# Patient Record
Sex: Female | Born: 1955
Health system: Southern US, Community
[De-identification: ages and names within clinical notes are randomized; demographics above are authoritative.]

## PROBLEM LIST (undated history)

## (undated) DIAGNOSIS — E785 Hyperlipidemia, unspecified: Secondary | ICD-10-CM

## (undated) DIAGNOSIS — K219 Gastro-esophageal reflux disease without esophagitis: Secondary | ICD-10-CM

## (undated) DIAGNOSIS — E039 Hypothyroidism, unspecified: Secondary | ICD-10-CM

## (undated) HISTORY — DX: Gastro-esophageal reflux disease without esophagitis: K21.9

## (undated) HISTORY — DX: Hyperlipidemia, unspecified: E78.5

## (undated) HISTORY — PX: DILATION AND CURETTAGE OF UTERUS: SHX78

## (undated) HISTORY — DX: Hypothyroidism, unspecified: E03.9

---

## 2001-03-31 DIAGNOSIS — D259 Leiomyoma of uterus, unspecified: Secondary | ICD-10-CM | POA: Insufficient documentation

## 2006-04-28 ENCOUNTER — Ambulatory Visit: Payer: Self-pay | Admitting: Internal Medicine

## 2006-04-29 ENCOUNTER — Ambulatory Visit: Payer: Self-pay | Admitting: *Deleted

## 2006-05-12 ENCOUNTER — Ambulatory Visit (HOSPITAL_COMMUNITY): Admission: RE | Admit: 2006-05-12 | Discharge: 2006-05-12 | Payer: Self-pay | Admitting: Internal Medicine

## 2006-05-29 ENCOUNTER — Encounter: Admission: RE | Admit: 2006-05-29 | Discharge: 2006-05-29 | Payer: Self-pay | Admitting: Family Medicine

## 2006-06-30 ENCOUNTER — Encounter (INDEPENDENT_AMBULATORY_CARE_PROVIDER_SITE_OTHER): Payer: Self-pay | Admitting: Internal Medicine

## 2006-06-30 ENCOUNTER — Other Ambulatory Visit: Admission: RE | Admit: 2006-06-30 | Discharge: 2006-06-30 | Payer: Self-pay | Admitting: Internal Medicine

## 2006-06-30 ENCOUNTER — Ambulatory Visit: Payer: Self-pay | Admitting: Internal Medicine

## 2006-06-30 LAB — CONVERTED CEMR LAB: Pap Smear: NORMAL

## 2006-07-28 ENCOUNTER — Ambulatory Visit: Payer: Self-pay | Admitting: Internal Medicine

## 2006-07-28 DIAGNOSIS — R498 Other voice and resonance disorders: Secondary | ICD-10-CM | POA: Insufficient documentation

## 2006-10-05 ENCOUNTER — Ambulatory Visit: Payer: Self-pay | Admitting: Internal Medicine

## 2006-10-05 DIAGNOSIS — M722 Plantar fascial fibromatosis: Secondary | ICD-10-CM | POA: Insufficient documentation

## 2006-12-02 ENCOUNTER — Telehealth (INDEPENDENT_AMBULATORY_CARE_PROVIDER_SITE_OTHER): Payer: Self-pay | Admitting: Internal Medicine

## 2006-12-04 ENCOUNTER — Encounter (INDEPENDENT_AMBULATORY_CARE_PROVIDER_SITE_OTHER): Payer: Self-pay | Admitting: Internal Medicine

## 2006-12-04 DIAGNOSIS — J309 Allergic rhinitis, unspecified: Secondary | ICD-10-CM | POA: Insufficient documentation

## 2006-12-05 DIAGNOSIS — R51 Headache: Secondary | ICD-10-CM | POA: Insufficient documentation

## 2006-12-05 DIAGNOSIS — R519 Headache, unspecified: Secondary | ICD-10-CM | POA: Insufficient documentation

## 2006-12-05 DIAGNOSIS — N946 Dysmenorrhea, unspecified: Secondary | ICD-10-CM | POA: Insufficient documentation

## 2006-12-08 ENCOUNTER — Ambulatory Visit: Payer: Self-pay | Admitting: Internal Medicine

## 2006-12-08 DIAGNOSIS — R1084 Generalized abdominal pain: Secondary | ICD-10-CM | POA: Insufficient documentation

## 2006-12-08 DIAGNOSIS — G2581 Restless legs syndrome: Secondary | ICD-10-CM | POA: Insufficient documentation

## 2006-12-08 DIAGNOSIS — R0789 Other chest pain: Secondary | ICD-10-CM | POA: Insufficient documentation

## 2006-12-08 LAB — CONVERTED CEMR LAB
ALT: 10 units/L (ref 0–35)
AST: 13 units/L (ref 0–37)
Chloride: 105 meq/L (ref 96–112)
Creatinine, Ser: 0.7 mg/dL (ref 0.40–1.20)
Sodium: 140 meq/L (ref 135–145)
Total Bilirubin: 0.3 mg/dL (ref 0.3–1.2)
Total Protein: 7 g/dL (ref 6.0–8.3)

## 2006-12-09 ENCOUNTER — Ambulatory Visit (HOSPITAL_COMMUNITY): Admission: RE | Admit: 2006-12-09 | Discharge: 2006-12-09 | Payer: Self-pay | Admitting: Internal Medicine

## 2006-12-09 ENCOUNTER — Encounter (INDEPENDENT_AMBULATORY_CARE_PROVIDER_SITE_OTHER): Payer: Self-pay | Admitting: *Deleted

## 2006-12-15 ENCOUNTER — Encounter (INDEPENDENT_AMBULATORY_CARE_PROVIDER_SITE_OTHER): Payer: Self-pay | Admitting: Internal Medicine

## 2006-12-15 ENCOUNTER — Encounter: Admission: RE | Admit: 2006-12-15 | Discharge: 2007-01-18 | Payer: Self-pay | Admitting: Internal Medicine

## 2006-12-16 ENCOUNTER — Encounter (INDEPENDENT_AMBULATORY_CARE_PROVIDER_SITE_OTHER): Payer: Self-pay | Admitting: *Deleted

## 2007-01-18 ENCOUNTER — Encounter (INDEPENDENT_AMBULATORY_CARE_PROVIDER_SITE_OTHER): Payer: Self-pay | Admitting: Internal Medicine

## 2007-01-19 ENCOUNTER — Telehealth (INDEPENDENT_AMBULATORY_CARE_PROVIDER_SITE_OTHER): Payer: Self-pay | Admitting: *Deleted

## 2007-02-05 ENCOUNTER — Ambulatory Visit: Payer: Self-pay | Admitting: Internal Medicine

## 2007-02-05 DIAGNOSIS — N3946 Mixed incontinence: Secondary | ICD-10-CM | POA: Insufficient documentation

## 2007-02-05 LAB — CONVERTED CEMR LAB
Nitrite: NEGATIVE
Protein, U semiquant: NEGATIVE
Specific Gravity, Urine: 1.01
WBC Urine, dipstick: NEGATIVE

## 2007-04-30 ENCOUNTER — Telehealth (INDEPENDENT_AMBULATORY_CARE_PROVIDER_SITE_OTHER): Payer: Self-pay | Admitting: *Deleted

## 2007-05-14 ENCOUNTER — Ambulatory Visit (HOSPITAL_COMMUNITY): Admission: RE | Admit: 2007-05-14 | Discharge: 2007-05-14 | Payer: Self-pay | Admitting: Internal Medicine

## 2007-05-20 ENCOUNTER — Ambulatory Visit: Payer: Self-pay | Admitting: Internal Medicine

## 2007-05-28 ENCOUNTER — Ambulatory Visit: Payer: Self-pay | Admitting: Internal Medicine

## 2007-05-28 ENCOUNTER — Encounter (INDEPENDENT_AMBULATORY_CARE_PROVIDER_SITE_OTHER): Payer: Self-pay | Admitting: Internal Medicine

## 2007-05-28 DIAGNOSIS — E782 Mixed hyperlipidemia: Secondary | ICD-10-CM | POA: Insufficient documentation

## 2007-05-28 DIAGNOSIS — K6289 Other specified diseases of anus and rectum: Secondary | ICD-10-CM | POA: Insufficient documentation

## 2007-06-05 ENCOUNTER — Encounter (INDEPENDENT_AMBULATORY_CARE_PROVIDER_SITE_OTHER): Payer: Self-pay | Admitting: Internal Medicine

## 2007-06-11 ENCOUNTER — Ambulatory Visit: Payer: Self-pay | Admitting: Internal Medicine

## 2007-06-19 ENCOUNTER — Encounter (INDEPENDENT_AMBULATORY_CARE_PROVIDER_SITE_OTHER): Payer: Self-pay | Admitting: Internal Medicine

## 2007-06-19 LAB — CONVERTED CEMR LAB
ALT: 11 units/L (ref 0–35)
Alkaline Phosphatase: 82 units/L (ref 39–117)
Basophils Absolute: 0 10*3/uL (ref 0.0–0.1)
Basophils Relative: 1 % (ref 0–1)
CO2: 21 meq/L (ref 19–32)
Cholesterol: 205 mg/dL — ABNORMAL HIGH (ref 0–200)
Creatinine, Ser: 0.57 mg/dL (ref 0.40–1.20)
Eosinophils Absolute: 0.1 10*3/uL (ref 0.0–0.7)
Eosinophils Relative: 1 % (ref 0–5)
HCT: 35.3 % — ABNORMAL LOW (ref 36.0–46.0)
Hemoglobin: 10.8 g/dL — ABNORMAL LOW (ref 12.0–15.0)
MCHC: 30.6 g/dL (ref 30.0–36.0)
MCV: 89.4 fL (ref 78.0–100.0)
Monocytes Absolute: 0.6 10*3/uL (ref 0.1–1.0)
RDW: 13.5 % (ref 11.5–15.5)
Total Bilirubin: 0.4 mg/dL (ref 0.3–1.2)
Total CHOL/HDL Ratio: 4.3
VLDL: 63 mg/dL — ABNORMAL HIGH (ref 0–40)

## 2007-10-07 ENCOUNTER — Encounter (INDEPENDENT_AMBULATORY_CARE_PROVIDER_SITE_OTHER): Payer: Self-pay | Admitting: Internal Medicine

## 2007-10-07 LAB — CONVERTED CEMR LAB: OCCULT 3: NEGATIVE

## 2009-08-30 ENCOUNTER — Encounter: Payer: Self-pay | Admitting: Internal Medicine

## 2009-10-16 ENCOUNTER — Encounter: Payer: Self-pay | Admitting: Internal Medicine

## 2009-10-16 ENCOUNTER — Encounter: Admission: RE | Admit: 2009-10-16 | Discharge: 2009-10-16 | Payer: Self-pay | Admitting: Specialist

## 2010-03-18 ENCOUNTER — Encounter: Payer: Self-pay | Admitting: Internal Medicine

## 2010-03-18 ENCOUNTER — Ambulatory Visit: Payer: Self-pay | Admitting: Internal Medicine

## 2010-03-18 DIAGNOSIS — R05 Cough: Secondary | ICD-10-CM

## 2010-03-18 DIAGNOSIS — R059 Cough, unspecified: Secondary | ICD-10-CM | POA: Insufficient documentation

## 2010-03-20 ENCOUNTER — Ambulatory Visit: Payer: Self-pay | Admitting: Internal Medicine

## 2010-03-26 LAB — CONVERTED CEMR LAB
Basophils Absolute: 0 10*3/uL (ref 0.0–0.1)
Eosinophils Absolute: 0.1 10*3/uL (ref 0.0–0.7)
HCT: 35.4 % — ABNORMAL LOW (ref 36.0–46.0)
Hemoglobin: 12 g/dL (ref 12.0–15.0)
Lymphocytes Relative: 26.7 % (ref 12.0–46.0)
Lymphs Abs: 1.7 10*3/uL (ref 0.7–4.0)
MCHC: 33.8 g/dL (ref 30.0–36.0)
Neutro Abs: 3.8 10*3/uL (ref 1.4–7.7)
RDW: 14.1 % (ref 11.5–14.6)

## 2010-04-21 ENCOUNTER — Encounter: Payer: Self-pay | Admitting: Occupational Therapy

## 2010-04-26 ENCOUNTER — Telehealth (INDEPENDENT_AMBULATORY_CARE_PROVIDER_SITE_OTHER): Payer: Self-pay | Admitting: *Deleted

## 2010-04-29 ENCOUNTER — Ambulatory Visit
Admission: RE | Admit: 2010-04-29 | Discharge: 2010-04-29 | Payer: Self-pay | Source: Home / Self Care | Attending: Internal Medicine | Admitting: Internal Medicine

## 2010-05-02 NOTE — Progress Notes (Signed)
  Phone Note Other Incoming   Request: Send information Summary of Call: Records received from Dr. Mayford Knife with [facility]. 11 pages forwarded to Dr. Sherene Sires for review.

## 2010-05-02 NOTE — Assessment & Plan Note (Signed)
Summary: Pulmonary consultation cough ? etiology   Visit Type:  Initial Consult Copy to:  Self Primary Sheri Rice/Referring Sheri Rice:  Dr. Lerry Liner  CC:  Cough.  History of Present Illness: 55 yo latino female limited English  never smoker denies any previous h/o resp problems but was dx and treated for allergic rhinitis in 2008 in [place] and referred to pulmonary clinic for cough 02/2010  March 18, 2010  1st pulmonary office eval for eval of cough comes and goes and 05/2009 gone for up a month then recurs not clear what she gets when it flares and what works vs what doesn't.  Present flare x one week,  worse when when lie down and while trying to sleep., assoc with nasal congestion. ? better with steroids in past,  ? better transiently from saba.  No excess or purulent secretions, apparently only sob with coughing, some HB and sore throat.    Pt denies any significant dysphagia, itching, sneezing,  nasal congestion or excess secretions,  fever, chills, sweats, unintended wt loss, pleuritic or exertional cp, hempoptysis, change in activity tolerance  orthopnea pnd or leg swelling .  Pt also denies any obvious fluctuation in symptoms with weather or environmental change or other alleviating or aggravating factors.       Current Medications (verified): 1)  Mucinex Fast-Max Dm Max 20-400 Mg/60ml Liqd (Dextromethorphan-Guaifenesin) .Marland Kitchen.. 1 Tsp Every 4 Hrs  Allergies (verified): No Known Drug Allergies  Past History:  Past Medical History: FIBROIDS, UTERUS (ICD-218.9) HOARSENESS (ICD-784.49) PLANTAR FASCIITIS, BILATERAL (ICD-728.71) DYSMENORRHEA (ICD-625.3) HEADACHE (ICD-784.0) ALLERGIC RHINITIS (ICD-477.9) * HX. OF [place] FOR LATENT TB Chronic / recurrent cough......................................Marland KitchenWert      - Allergy profile March 18, 2010       - Sinus CT March 19, 2010 >>  Family History: Reviewed history from 05/28/2007 and no changes required. Mother, 35, DM,  Hyperlipidemia Father, died age 66's --complications of injuries from MVA, PVD 5 Siblings:  Healthy Son, 38, allergies Daughter, 41, healthy Son, 18, healthy  Social History: Moved to [place] in 2005 [place] with husband. 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  Daughter is a Building control surveyor son in [place]. Never smoker   Review of Systems       The patient complains of shortness of breath with activity, shortness of breath at rest, productive cough, non-productive cough, chest pain, acid heartburn, indigestion, abdominal pain, sore throat, nasal congestion/difficulty breathing through nose, and change in color of mucus.  The patient denies coughing up blood, irregular heartbeats, loss of appetite, weight change, difficulty swallowing, tooth/dental problems, headaches, sneezing, itching, ear ache, anxiety, depression, hand/feet swelling, joint stiffness or pain, rash, and fever.    Vital Signs:  Patient profile:   55 year old female Height:      62 inches Weight:      155.50 pounds BMI:     28.54 O2 Sat:      97 % on Room air Temp:     97.8 degrees F oral Pulse rate:   71 / minute BP sitting:   118 / 80  (left arm)  Vitals Entered By: Vernie Murders (March 18, 2010 10:04 AM)  O2 Flow:  Room air  Physical Exam  Additional Exam:  amb latino female nad wt 161 > 155   March 18, 2010  HEENT: nl dentition, turbinates, and orophanx. Nl external ear canals without cough reflex NECK :  without  JVD/Nodes/TM/ nl carotid upstrokes bilaterally LUNGS: no acc muscle use, clear to A and P bilaterally without cough on insp or exp maneuvers CV:  RRR  no s3 or murmur or increase in P2, no edema   ABD:  soft and nontender with nl excursion in the supine position. No bruits or organomegaly, bowel sounds nl MS:  warm without deformities, calf tenderness, cyanosis or clubbing SKIN: warm and dry without  lesions   NEURO:  alert, approp, no deficits     CXR  Procedure date:  10/16/2009  Findings:      Possible mild bronchitis   Impression & Recommendations:  Problem # 1:  COUGH (ICD-786.2) The most common causes of chronic cough in immunocompetent adults include: upper airway cough syndrome (UACS), previously referred to as postnasal drip syndrome,  caused by variety of rhinosinus conditions; (2) asthma; (3) GERD; (4) chronic bronchitis from cigarette smoking or other inhaled environmental irritants; (5) nonasthmatic eosinophilic bronchitis; and (6) bronchiectasis. These conditions, singly or in combination, have accounted for up to 94% of the causes of chronic cough in prospective studies.  This is most c/w  Classic Upper airway cough syndrome, so named because it's frequently impossible to sort out how much is  CR/sinusitis with freq throat clearing (which can be related to primary GERD)   vs  causing  secondary extra esophageal GERD from wide swings in gastric pressure that occur with throat clearing, promoting self use of mint and menthol lozenges that reduce the lower esophageal sphincter tone and exacerbate the problem further These are the same pts who not infrequently have failed to tolerate ace inhibitors,  dry powder inhalers or biphosphonates or report having reflux symptoms that don't respond to standard doses of PPI  For now check sinus ct and allergy profile and rx for GERD then regroup.  Explained to pt and daughter: The standardized cough guidelines recently published in Chest are a 14 step process, not a single office visit,  and are intended  to address this problem logically,  with an alogrithm dependent on response to each progressive step  to determine a specific diagnosis with  minimal addtional testing needed. Therefore if compliance is an issue this empiric standardized approach simply won't work.   See instructions for specific recommendations   Medications Added to  Medication List This Visit: 1)  Pepcid 20 Mg Tabs (Famotidine) .... Take one by mouth at bedtime 2)  Protonix 40 Mg Tbec (Pantoprazole sodium) .... By mouth daily. take one half hour before eating. 3)  Mucinex Fast-max Dm Max 20-400 Mg/84ml Liqd (Dextromethorphan-guaifenesin) .Marland Kitchen.. 1 tsp every 4 hrs 4)  Prednisone 10 Mg Tabs (Prednisone) .... 4 each am x 2days, 2x2days, 1x2days and stop  Other Orders: T-Allergy Profile [facility] [place], [place], [place], [place], [place] [phone]) Misc. Referral (Misc. Ref) TLB-CBC Platelet - w/Differential ([id number]) Consultation Level V (57322)  Patient Instructions: 1)  Protonix  before bfast and pepcid 20 mg at bedtime  until comes return  2)  Prednisone 4 each am x 2days, 2x2days, 1x2days and stop  3)  GERD (REFLUX)  is a common cause of respiratory symptoms. It commonly presents without heartburn and can be treated with medication, but also with lifestyle changes including avoidance of late meals, excessive alcohol, smoking cessation, and avoid fatty foods, chocolate, peppermint, colas, red wine, and acidic juices such as orange juice. NO MINT OR MENTHOL PRODUCTS SO NO COUGH DROPS  4)  USE SUGARLESS CANDY INSTEAD (jolley ranchers)  5)  NO OIL BASED  VITAMINS  6)  See Patient Care Coordinator before leaving for sinus ct 7)  Please schedule a follow-up appointment in 4 weeks, sooner if needed  8)  Late add:  Unlike when you get a prescription for eyeglasses, it's not possible to always walk out of this or any medical office with a perfect prescription that is immediately effective  based on any test that we offer here.  On the contrary, it may take several weeks for the full impact of changes recommened today - hopefully you will respond well.  If not, then we'll adjust your medication on your next visit accordingly, knowing more then than we can possibly know now.     Prescriptions: PREDNISONE 10 MG  TABS (PREDNISONE) 4 each am x 2days, 2x2days, 1x2days and stop  #14 x 0   Entered  and Authorized by:   Nyoka Cowden MD   Signed by:   Nyoka Cowden MD on 03/18/2010   Method used:   Electronically to        [facility] [phone]* (retail)       [address]       [place], [place]  [address]       Ph: [phone]       Fax: [phone]   RxID:   [id number] PROTONIX 40 MG  TBEC (PANTOPRAZOLE SODIUM) By mouth daily. Take one half hour before eating.  #34 x 3   Entered and Authorized by:   Nyoka Cowden MD   Signed by:   Nyoka Cowden MD on 03/18/2010   Method used:   Electronically to        [facility] [phone]* (retail)       [address]       [place], [place]  [address]       Ph: [phone]       Fax: [phone]   RxID:   [id number]

## 2010-05-08 NOTE — Assessment & Plan Note (Signed)
Summary: Pulmonary/ cough better p rx of sinusitis    Copy to:  Self Primary Provider/Referring Provider:  Dr. Lerry Liner  CC:  cough is better.Marland Kitchen  History of Present Illness: 55 yo latino female limited English  never smoker denies any previous h/o resp problems but was dx and treated for allergic rhinitis in 2008 in [place] and referred to pulmonary clinic for cough 02/2010  March 18, 2010  1st pulmonary office eval for eval of cough comes and goes and 05/2009 gone for up a month then recurs not clear what she gets when it flares and what works vs what doesn't.  Present flare x one week,  worse when when lie down and while trying to sleep., assoc with nasal congestion. ? better with steroids in past,  ? better transiently from saba.  No excess or purulent secretions, apparently only sob with coughing, some HB and sore throat.  Protonix  before bfast and pepcid 20 mg at bedtime  until comes return  Prednisone 4 each am x 2days, 2x2days, 1x2days and stop  GERD (REFLUX)  diet   See Patient Care Coordinator before leaving for sinus ct > pos sphenoid sinusits rx with augmentin    April 29, 2010 cough is better, no sob. Pt denies any significant sore throat, dysphagia, itching, sneezing,  nasal congestion or excess secretions,  fever, chills, sweats, unintended wt loss, pleuritic or exertional cp, hempoptysis, change in activity tolerance  orthopnea pnd or leg swelling.  Pt also denies any obvious fluctuation in symptoms with weather or environmental change or other alleviating or aggravating factors.         Current Medications (verified): 1)  Protonix 40 Mg  Tbec (Pantoprazole Sodium) .... By Mouth Daily. Take One Half Hour Before Eating.  Allergies (verified): No Known Drug Allergies  Past History:  Past Medical History: FIBROIDS, UTERUS (ICD-218.9) HOARSENESS (ICD-784.49) PLANTAR FASCIITIS, BILATERAL (ICD-728.71) DYSMENORRHEA (ICD-625.3) HEADACHE (ICD-784.0) ALLERGIC RHINITIS  (ICD-477.9) * HX. OF [place] FOR LATENT TB Chronic / recurrent cough......................................Marland KitchenWert      - Allergy profile March 18, 2010 >  neg       - Sinus CT March 20, 2010 >>  sphenoid sinusitis  Vital Signs:  Patient profile:   55 year old female Height:      62 inches Weight:      154.13 pounds BMI:     28.29 O2 Sat:      95 % on Room air Temp:     97.6 degrees F oral Pulse rate:   71 / minute BP sitting:   122 / 78  (left arm) Cuff size:   regular  Vitals Entered By: Carver Fila (April 29, 2010 11:54 AM)  O2 Flow:  Room air CC: cough is better. Comments meds and allergies updated Phone number updated Carver Fila  April 29, 2010 11:54 AM    Physical Exam  Additional Exam:  amb latino female nad wt 161 > 155   March 18, 2010 > 154 April 29, 2010  HEENT: nl dentition, turbinates, and orophanx. Nl external ear canals without cough reflex NECK :  without JVD/Nodes/TM/ nl carotid upstrokes bilaterally LUNGS: no acc muscle use, clear to A and P bilaterally without cough on insp or exp maneuvers CV:  RRR  no s3 or murmur or increase in P2, no edema   ABD:  soft and nontender with nl excursion in the supine position. No bruits or organomegaly, bowel sounds nl MS:  warm without deformities, calf tenderness, cyanosis  or clubbing     CXR  Procedure date:  04/30/2010  Findings:       Comparison: Two-view chest x-ray 10/16/2009.   Findings: Cardiomediastinal silhouette unremarkable and unchanged. Lungs clear.  Bronchovascular markings normal.  No pleural effusions.  Degenerative changes involving the thoracic spine.  No significant interval change.   IMPRESSION: No acute cardiopulmonary disease.  Stable chest x-ray.  Impression & Recommendations:  Problem # 1:  COUGH (ICD-786.2) The most common causes of chronic cough in immunocompetent adults include: upper airway cough syndrome (UACS), previously referred to as postnasal drip syndrome,   caused by variety of rhinosinus conditions; (2) asthma; (3) GERD; (4) chronic bronchitis from cigarette smoking or other inhaled environmental irritants; (5) nonasthmatic eosinophilic bronchitis; and (6) bronchiectasis. These conditions, singly or in combination, have accounted for up to 94% of the causes of chronic cough in prospective studies.  This is most c/w  Classic Upper airway cough syndrome, so named because it's frequently impossible to sort out how much is  CR/sinusitis with freq throat clearing (which can be related to primary GERD)   vs  causing  secondary extra esophageal GERD from wide swings in gastric pressure that occur with throat clearing, promoting self use of mint and menthol lozenges that reduce the lower esophageal sphincter tone and exacerbate the problem further These are the same pts who not infrequently have failed to tolerate ace inhibitors,  dry powder inhalers or biphosphonates or report having reflux symptoms that don't respond to standard doses of PPI  For now mas  rx for GERD then repeat sinus ct if not happy with control  Other Orders: T-2 View CXR ([id number])  Patient Instructions: 1)  Protonix  before bfast and pepcid 20 mg at bedtime  until  cough completely gone 2)  GERD (REFLUX)  is a common cause of respiratory symptoms. It commonly presents without heartburn and can be treated with medication, but also with lifestyle changes including avoidance of late meals, excessive alcohol, smoking cessation, and avoid fatty foods, chocolate, peppermint, colas, red wine, and acidic juices such as orange juice. NO MINT OR MENTHOL PRODUCTS SO NO COUGH DROPS  3)  USE SUGARLESS CANDY INSTEAD (jolley ranchers)  4)  NO OIL BASED VITAMINS  5)  Call  Patient Care Coordinator Libby at [phone] in two weeks if not happy with cough control.   Orders Added: 1)  T-2 View CXR [[id number]]     Appended Document: Orders Update     Clinical Lists Changes  Orders: Added new  Service order of Est. Patient Level IV (45409) - Signed

## 2011-11-18 ENCOUNTER — Other Ambulatory Visit: Payer: Self-pay | Admitting: Specialist

## 2011-11-18 ENCOUNTER — Ambulatory Visit
Admission: RE | Admit: 2011-11-18 | Discharge: 2011-11-18 | Disposition: A | Discharge status: Home / Self Care | Payer: 59 | Source: Ambulatory Visit | Attending: Specialist | Admitting: Specialist

## 2011-11-18 DIAGNOSIS — M25552 Pain in left hip: Secondary | ICD-10-CM

## 2013-02-22 ENCOUNTER — Other Ambulatory Visit: Payer: Self-pay | Admitting: Emergency Medicine

## 2013-02-22 DIAGNOSIS — Z1231 Encounter for screening mammogram for malignant neoplasm of breast: Secondary | ICD-10-CM

## 2013-04-04 ENCOUNTER — Ambulatory Visit
Admission: RE | Admit: 2013-04-04 | Discharge: 2013-04-04 | Disposition: A | Discharge status: Home / Self Care | Payer: 59 | Source: Ambulatory Visit | Attending: Emergency Medicine | Admitting: Emergency Medicine

## 2013-04-04 DIAGNOSIS — Z1231 Encounter for screening mammogram for malignant neoplasm of breast: Secondary | ICD-10-CM

## 2015-03-16 ENCOUNTER — Ambulatory Visit (INDEPENDENT_AMBULATORY_CARE_PROVIDER_SITE_OTHER): Payer: Commercial Managed Care - HMO | Admitting: Family

## 2015-03-16 ENCOUNTER — Encounter: Payer: Self-pay | Admitting: Family

## 2015-03-16 VITALS — BP 138/90 | HR 81 | Temp 97.6°F | Resp 18 | Ht 65.0 in | Wt 153.1 lb

## 2015-03-16 DIAGNOSIS — M170 Bilateral primary osteoarthritis of knee: Secondary | ICD-10-CM | POA: Diagnosis not present

## 2015-03-16 DIAGNOSIS — M5030 Other cervical disc degeneration, unspecified cervical region: Secondary | ICD-10-CM | POA: Insufficient documentation

## 2015-03-16 MED ORDER — MELOXICAM 15 MG PO TABS: 15.0000 mg | ORAL_TABLET | Freq: Every day | ORAL | Status: DC

## 2015-03-16 MED ORDER — CYCLOBENZAPRINE HCL 10 MG PO TABS: 5.0000 mg | ORAL_TABLET | Freq: Three times a day (TID) | ORAL | Status: DC | PRN

## 2015-03-16 NOTE — Progress Notes (Signed)
Pre visit review using our clinic review tool, if applicable. No additional management support is needed unless otherwise documented below in the visit note. 

## 2015-03-16 NOTE — Patient Instructions (Signed)
Thank you for choosing [facility].  Summary/Instructions:  Your prescription(s) have been submitted to your pharmacy or been printed and provided for you. Please take as directed and contact our office if you believe you are having problem(s) with the medication(s) or have any questions.  Please stop by the lab on the basement level of the building for your blood work. Your results will be released to [place] (or called to you) after review, usually within 72 hours after test completion. If any changes need to be made, you will be notified at that same time.  If your symptoms worsen or fail to improve, please contact our office for further instruction, or in case of emergency go directly to the emergency room at the closest medical facility.   Distensin y esguince cervical con rehabilitacin (Cervical Strain and Sprain With Rehab) La distensin y el esguince cervical suelen deberse a lesiones provocadas por movimientos de Buyer, retail cervical. El latigazo cervical es un movimiento de flexin del cuello hacia atrs o adelante que es brusco y Garment/textile technologist, por ejemplo, durante un accidente automovilstico o mientras se practican deportes de contacto. Los msculos, los ligamentos, los tendones, los discos y los nervios del cuello son propensos a lesionarse cuando esto ocurre. [place] sufrir un esguince cervical aumenta con lo siguiente:  Artrosis de columna.  Situaciones que aumentan la probabilidad de sufrir accidentes o traumatismos de la cabeza o el cuello.  Deportes de Public affairs consultant (ftbol americano, rugby, hockey, automovilismo, gimnasia, buceo, karate de contacto o boxeo).  Poca fuerza y flexibilidad en el cuello.  Lesin previa en el cuello.  Mala tcnica de placaje.  Equipo de calce inadecuado o que no est bien acolchado. SNTOMAS   Dolor o rigidez en la parte delantera o posterior del cuello, o en ambas.  Los sntomas pueden aparecer de inmediato o en  el trmino de 24horas despus de la lesin.  Mareos, dolor de [place], nuseas y vmitos.  Espasmo muscular con dolor y rigidez en el cuello.  Dolor a la palpacin e Estate agent de la lesin. PREVENCIN  Aprenda y use las tcnicas adecuadas (no plaque ni embista con la cabeza, ni d topetazos; use las tcnicas correctas para caer a fin de evitar caerse de cabeza).  Haga los ejercicios de precalentamiento y elongacin correctos antes de la [place].  Mantngase en buen estado fsico:  Kerry Hough, flexibilidad y resistencia.  Buen estado cardiovascular.  Use equipo de proteccin que calce correctamente y est bien acolchado, por ejemplo, collarines blandos acolchados, cuando practique deportes de contacto. PRONSTICO  La recuperacin de las lesiones por distensin y esguince cervical depende de la magnitud de la lesin. Generalmente, la lesin se cura en el trmino de 1semana a 30meses con el tratamiento adecuado.  COMPLICACIONES RELACIONADAS   Pueden presentarse adormecimiento y debilidad temporarios si las races nerviosas estn daadas, que pueden continuar hasta que el nervio est completamente curado.  Dolor crnico debido a la recurrencia frecuente de los sntomas.  Recuperacin prolongada, especialmente si se reanuda la [employer] pronto (antes de la recuperacin total). TRATAMIENTO  Inicialmente, el tratamiento incluye el uso de hielo y medicamentos para ayudar a Best boy y la inflamacin. Tambin es Publishing rights manager ejercicios de fortalecimiento y Landscape architect, y Radio broadcast assistant las actividades que intensifican los sntomas para que la lesin no empeore. Estos ejercicios pueden realizarse en la casa o con un terapeuta. A los pacientes que tienen sntomas intensos, tal vez se les recomiende el uso de  un collarn blando acolchado alrededor del cuello.  Mejorar la postura puede ayudar a [employer] sntomas. La mejora de la postura incluye hundir el abdomen y el  mentn mientras est de pie o sentado. Si se sienta, hgalo en una silla firme con los glteos apoyados contra el respaldo. Mientras duerme, intente reemplazar la almohada por una toalla pequea enrollada de 2pulgadas (5centmetros) de dimetro, o use una almohada cervical o un collarn cervical blando. Las S[employer]otive posiciones al dormir Radiographer, therapeutic.  Para los pacientes que tienen dao de las races nerviosas que les causa adormecimiento o debilidad, puede ser recomendable un aparato de traccin cervical. En contadas ocasiones, se debe realizar una ciruga para tratar estas lesiones. Sin embargo, es posible que la distensin y los esguinces cervicales que estn presentes al nacer (congnitos) requieran [place]. MEDICAMENTOS   Si se necesitan analgsicos, a menudo se recomiendan los antiinflamatorios no esteroides, como la aspirina y el ibuprofeno, u otros analgsicos suaves, como el paracetamol.  No tome analgsicos durante 7das antes de la [place].  Se pueden administrar analgsicos recetados si el mdico lo considera necesario. Utilcelos como se le indique y solo cuando lo necesite. CALOR Y FRO:   El tratamiento confro (aplicacin de hielo) Futures trader dolor y reduce la inflamacin. Este se debe aplicar durante 10 a [id number] cada 2 o 3horas para la inflamacin y Conservation officer, historic buildings, e inmediatamente despus de Optometrist cualquier actividad que intensifique los sntomas. Use bolsas de hielo o un masaje con hielo.  Se puede usar Charity fundraiser antes de Optometrist las actividades de elongacin y fortalecimiento indicadas por el mdico, el fisioterapeuta o Industrial/product designer. Pngase una compresa caliente o dese un bao tibio de inmersin. SOLICITE ATENCIN MDICA SI:   Los sntomas empeoran o no mejoran en 2semanas, a pesar de Chiropodist.  Presenta sntomas nuevos sin motivo aparente (los medicamentos utilizados durante el tratamiento pueden producir [place]). EJERCICIOS EJERCICIOS DE AMPLITUD DE MOVIMIENTOS Y DE ELONGACIN: distensin y esguince cervical Estos ejercicios pueden ser de ayuda al comenzar la rehabilitacin de la lesin. Para que los sntomas se resuelvan satisfactoriamente, debe mejorar la postura. Estos ejercicios estn diseados para ayudar a Museum/gallery exhibitions officer de la cabeza hacia adelante y protraccin de los hombros, la cual contribuye a Personnel officer. Los sntomas pueden resolverse con o sin mayor intervencin del mdico, el fisioterapeuta o Industrial/product designer. Mientras realiza estos ejercicios, recuerde lo siguiente:   Al restablecer la flexibilidad de los tejidos, las articulaciones recuperan el movimiento normal, lo que permite movimientos y actividades ms dinmicos y con Producer, television/film/video.  La elongacin eficaz se debe mantener durante por lo menos 20segundos, aunque tal vez deba comenzar con sesiones de [employer] para su comodidad.  La elongacin nunca debe ser dolorosa. Solo debe sentir un estiramiento o aflojamiento suave en tejido en elongacin. ELONGACIN: extensores axiales  Acustese en el piso boca arriba. Puede flexionar las rodillas para estar cmodo. Coloque un toalla de mano o un repasador enrollado, de unas 2pulgadas (5centmetros) de dimetro, debajo de la zona de la cabeza que est apoyada sobre el piso.  Suavemente hunda el [place], como si intentara formar una papada, [place] sentir una leve elongacin en la base de la cabeza.  Mantenga la posicin durante __________segundos. Repita __________veces. Haga este ejercicio __________veces por da.  ELONGACIN: extensin axial  Prese o sintese sobre una superficie firme. Adopte una postura correcta: el pecho erguido, los hombros [place] atrs, los msculos abdominales apenas tensos, las rodillas sin  trabar (si est de pie) y los pies separados al ancho las caderas.  Con un movimiento lento, lleve el [employer] Health, de modo que la cabeza se deslice hacia  atrs y el mentn baje levemente. Siga mirando hacia adelante.  Debe sentir una elongacin [employer] parte posterior de la cabeza. Tenga presente que la elongacin no tiene que ser brusca ya que esto puede causar dolores de cabeza ms tarde.  Mantenga la posicin durante __________segundos. Repita __________veces. Haga este ejercicio __________veces por da. ELONGACIN: flexin cervical lateral   Prese o sintese sobre una superficie firme. Adopte una postura correcta: el pecho erguido, los hombros [employer], los msculos abdominales apenas tensos, las rodillas sin trabar (si est de pie) y los pies separados al ancho las caderas.  Sin mover la nariz ni los hombros, lentamente deje caer la oreja derecha / izquierdo hacia el hombro hasta sentir la elongacin suave de los msculos del lado contrario del cuello.  Mantenga la posicin durante __________segundos. Repita __________veces. Haga este ejercicio __________veces por da. ELONGACIN: rotadores cervicales   Prese o sintese sobre una superficie firme. Adopte una postura correcta: el pecho erguido, los hombros [employer], los msculos abdominales apenas tensos, las rodillas sin trabar (si est de pie) y los pies separados al ancho las caderas.  Con los ojos nivelados con el piso, gire lentamente la cabeza hasta sentir una elongacin [place] a lo largo de la espalda y el lado opuesto del cuello.  Mantenga la posicin durante __________segundos. Repita __________veces. Haga este ejercicio __________veces por da. AMPLITUD DE MOVIMIENTOS: crculos con el cuello   Prese o sintese sobre una superficie firme. Adopte una postura correcta: el pecho erguido, los hombros [employer], los msculos abdominales apenas tensos, las rodillas sin trabar (si est de pie) y los pies separados al ancho las caderas.  Suavemente baje la cabeza y haga movimientos circulares desde la parte posterior de un hombro hasta la parte posterior del [place].  El movimiento nunca debe ser forzado ni doloroso.  Repita el movimiento 10 o 20veces, o hasta que sienta que los msculos del cuello se Engineer, agricultural y se aflojan. Repita __________veces. Haga el ejercicio __________veces por da. EJERCICIOS DE FORTALECIMIENTO: distensin y esguince cervical Estos ejercicios pueden ser de ayuda al comenzar la rehabilitacin de la lesin. Estos pueden resolver los sntomas con o sin mayor intervencin del mdico, el fisioterapeuta o Industrial/product designer. Mientras realiza estos ejercicios, recuerde lo siguiente:   Los msculos pueden adquirir la resistencia y la fuerza necesarias para las actividades cotidianas a travs de ejercicios controlados.  Realice estos ejercicios como se lo hayan indicado el mdico, el fisioterapeuta o Industrial/product designer. Aumente la resistencia y las repeticiones solo como se lo hayan indicado.  Puede tener dolor o fatiga muscular; sin embargo, Conservation officer, historic buildings o las molestias que intenta eliminar nunca deben intensificarse durante la realizacin de estos ejercicios. Si el dolor se intensifica, detngase y asegrese de estar siguiendo las indicaciones de Fish farm manager. Si an siente dolor despus de los ajustes, deje de hacer el ejercicio hasta tanto pueda analizar el problema con el mdico. FUERZA: flexores cervicales, isomtrico   Prese de frente a una pared a una distancia aproximada de 6pulgadas (15centmetros). Coloque una almohada pequea, una pelota de unas 6 a 8pulgadas (15 a 20centmetros) de dimetro o una toalla doblada entre la frente y la pared.  Hunda levemente el mentn y, con [place], empuje el objeto blando con la frente. La intensidad del empuje debe ser leve a  moderada, y la tensin debe aumentarse de manera gradual. Mantenga relajadas la mandbula y la frente.  Mantenga la posicin durante 10 a 20segundos. Respire tranquilo.  [place] lentamente la tensin. Relaje los msculos del cuello por completo antes de comenzar la siguiente  repeticin. Repita __________veces. Haga este ejercicio __________veces por da. FUERZA: flexores cervicales laterales, isomtrico   Prese a una distancia aproximada de 6pulgadas (15centmetros) de una pared. Coloque una almohada pequea, una pelota de unas 6 a 8pulgadas (15 a 20centmetros) de Occupational hygienist o una toalla doblada entre el costado de la cabeza y la pared.  Hunda levemente el mentn y, con [place], empuje el objeto blando con la [place]. La intensidad del empuje debe ser leve a moderada, y la tensin debe aumentarse de manera gradual. Mantenga relajadas la mandbula y la frente.  Mantenga la posicin durante 10 a 20segundos. Respire tranquilo.  [place] lentamente la tensin. Relaje los msculos del cuello por completo antes de comenzar la siguiente repeticin. Repita __________veces. Haga este ejercicio __________veces por da. FUERZA: extensores cervicales, isomtrico  Prese a una distancia aproximada de 6pulgadas (15centmetros) de una pared. Coloque una almohada pequea, una pelota de unas 6 a 8pulgadas (15 a 20centmetros) de dimetro o una toalla doblada entre la zona posterior de la cabeza y la pared.  Hunda levemente el mentn y, con [place], empuje el objeto blando con la parte posterior de la cabeza. La intensidad del empuje debe ser leve a moderada, y la tensin debe aumentarse de manera gradual. Mantenga relajadas la mandbula y la frente.  Mantenga la posicin durante 10 a 20segundos. Respire tranquilo.  [place] lentamente la tensin. Relaje los msculos del cuello por completo antes de comenzar la siguiente repeticin. Repita __________veces. Haga este ejercicio __________veces por da. CONSIDERACIONES ACERCA DE LA POSTURA Y LA MECNICA DEL CUERPO: distensin y esguince cervical Mantener una postura correcta mientras est sentado, de pie o realizando sus actividades reducir la tensin en los diferentes tejidos del cuerpo, lo que permitir la  recuperacin de los tejidos lesionados y la disminucin de las experiencias que Financial risk analyst. A continuacin se incluyen pautas generales para mejorar la postura. El mdico o el fisioterapeuta le darn indicaciones especficas para sus necesidades. Mientras lea estas pautas, recuerde lo siguiente:  Los ejercicios que el mdico le indique lo ayudarn a Systems analyst flexibilidad y la fuerza para Theatre manager las posturas correctas.  La postura correcta ofrece a las articulaciones el entorno ptimo para su funcionamiento. Todas las articulaciones sufren un desgaste menor cuando la columna est en la postura correcta y brinda un sostn adecuado. Esto significa un cuerpo ms sano y con [employer].  En todas las actividades, la postura debe ser la correcta, especialmente cuando est sentado o de pie. La postura correcta es igual de importante cuando realiza actividades repetitivas con bajo nivel de tensin (tipear) y cuando lleva a cabo una nica actividad con cargas pesadas (levantar objetos). DE PIE DURANTE MUCHO TIEMPO E [place]  Cuando realice una tarea que le exija inclinarse hacia adelante mientras est de pie en un lugar durante mucho tiempo, apoye un pie sobre un objeto inmvil que tenga una altura de 2 a 4pulgadas (5 a 10centmetros), para Therapist, occupational. Cuando ambos pies estn apoyados en el piso, la parte baja de la espalda tiende a perder la curvatura leve que tiene [place]. Si esta curva se aplana (o se vuelve muy pronunciada), aumentar mucho la tensin sobre la espalda y las dems articulaciones, se fatigarn con  mayor rapidez y tal Copywriter, advertising.  POSICIONES DE DESCANSO Tenga en cuentas las posiciones que ms dolor le causan cuando elija una de descanso. Si las [employer] exigen flexionarse (sentarse, agacharse, encorvarse, [employer] en cuclillas) le Financial risk analyst, opte por una posicin que le permita descansar en una postura menos flexionada.  No se curve en posicin fetal de costado. Si el dolor se intensifica con las [employer] exigen extenderse (estar de pie durante mucho tiempo, trabajar con las manos por encima de la cabeza), no descanse en una posicin extendida, por ejemplo, dormir boca abajo. La mayora de las personas estarn ms cmodas cuando descansen con la columna vertebral en una posicin ms neutral, ni muy curvada ni muy arqueada. Con frecuencia, se sentir ms aliviado si se acuesta de costado en una cama que no se hunda con una [employer]o, o boca arriba con una almohada debajo de las rodillas. Recuerde Sales promotion account executive en una sola posicin durante [employer], sin importar si la postura es [place], puede causar rigidez. CAMINAR [place] erguido. Las [place], los hombros y las caderas deben estar alineados. TRABAJO DE OFICINA Si trabaja en un escritorio, cree un entorno que le permita mantener una buena postura erguida. Sin soporte adicional, los msculos se fatigan y causan una tensin excesiva en las articulaciones y otros tejidos. SILLA:  La silla debe poder deslizarse por debajo del escritorio cuando apoye la espalda en el respaldo. Esto le permite trabajar ms cerca.  La altura de la silla debe permitirle que los ojos estn nivelados con la parte superior del monitor y las manos estn apenas ms abajo que los codos.  Posicin del cuerpo:  Debe tener los pies apoyados en el piso. Si no es posible, use un posapies.  Mantenga las orejas por encima de los hombros. Esto reducir la tensin en el cuello y la cintura.   Esta informacin no tiene Marine scientist el consejo del mdico. Asegrese de hacerle al mdico cualquier pregunta que tenga.   Document Released: 01/01/2006 Document Revised: 04/07/2014 Elsevier Interactive Patient Education 2016 [place] con rehabilitacin (Low Back Sprain With Rehab) Un esguince es una lesin en la que el ligamento se  desgarra. Los ligamentos de la cintura son susceptibles de sufrir esguinces. Sin embargo, estos ligamentos son Orlene Erm fuertes y se requiere de una gran fuerza para lesionarlos. Son importantes para estabilizar la mdula [place] esguinces se clasifican en tres categoras. Los esguinces de grado 1 ocasionan dolor, pero el tendn no est alargado. En los esguinces de grado 2 hay un ligamento alargado, debido a un estiramiento o desgarro parcial. En el esguince de [place] 2 an se mantiene la funcin, aunque sta puede estar alterada. Un esguince en grado 3 es la ruptura completa del msculo o el tendn, y suele quedar incapacitada la funcin. SNTOMAS  Dolor intenso en la cintura.  Sensacin de estallido o ruptura en el momento de la lesin.  Sensibilidad y a veces hinchazn en la zona de la lesin.  Algunas veces, hematoma (contusin) en el lugar de la lesin dentro de las 48 horas.  Espasmos musculares en la espalda. CAUSAS El esguince se produce cuando se aplica una fuerza en el ligamento que es mayor de lo que puede soportar. Las causas ms frecuentes de la lesin son:  Sherrye Payor actividad estresante en una posicin incmoda.  Actividades estresantes repetidas que implican movimiento de la cintura.  [place] directo en la cintura (traumatismo).  LOS RIESGOS AUMENTAN CON:  Deportes de contacto (ftbol, lucha).  Colisiones (principalmente accidentes de esqu).  Deportes que requieren arrojar o Retail banker elemento (levantamiento de pesas, bisbol).  Deportes que implican girar la columna (gimnasia, clavados, tenis, golf)  Poca fuerza y flexibilidad.  Proteccin inadecuada.  Cirugas previas en la espalda (especialmente fusin). PREVENCIN  Use el equipo protector adecuado y [employer].  Precalentamiento adecuado y elongacin antes de la [place].  Descanso y recuperacin entre actividades.  Mantener la forma fsica:  Kerry Hough, flexibilidad y resistencia  muscular.  Capacidad cardiovascular.  Mantenga un peso corporal adecuado. PRONSTICO Si se trata adecuadamente, estos esguinces pueden curarse con tratamiento no quirrgico. El tiempo de curacin depende de la gravedad de la lesin.  posibles complicaciones:  La recurrencia frecuente de los sntomas puede dar como resultado un problema crnico.  Inflamacin crnica y dolor en la cintura.  Retraso en la curacin o resolucin de los sntomas, en particular si se retoma la actividad rpidamente.  Discapacidad prolongada.  Articulacin inestable o artrtica en la cintura. TRATAMIENTO El tratamiento inicial incluye el uso de medicamentos y la aplicacin de hielo para reducir Conservation officer, historic buildings y la inflamacin. Los ejercicios de elongacin y fortalecimiento pueden ayudar a reducir Conservation officer, historic buildings con la [place]. Los ejercicios pueden Press photographer o con un terapeuta. Los Apple Computer graves pueden requerir la derivacin a un fisioterapeuta para Film/video editor evaluacin y Medical laboratory scientific officer un tratamiento, como ultrasonido. El profesional podr indicarle el uso de un dispositivo ortopdico para ayudar a Dietitian y la inflamacin. A menudo, demasiado reposo en cama podr resultar en ms daos que beneficios. Podrn prescribirle inyecciones de corticoides. Sin embargo, esto deber reservarse para los casos ms graves. Es Theatre manager uso de la espalda cuando se levantan objetos. Por la noche, se aconseja que usted [place], sobre un colchn firme y coloque una almohada debajo de las rodillas. Si no se obtiene xito con Music therapist, ser necesario someterse a [place].  MEDICAMENTOS   Si es necesaria la administracin de medicamentos para Conservation officer, historic buildings, se recomiendan los antiinflamatorios no esteroides, como aspirina e ibuprofeno y otros calmantes menores, como acetaminofeno.  No tome medicamentos para el dolor dentro de los 7 das previos a la [place].  El profesional podr  prescribirle calmantes si lo considera necesario. Utilcelos como se le indique y slo cuando lo necesite.  Podr beneficiarse con [employer].  En algunos casos se indica una inyeccin de corticosteroides. Estas inyecciones deben reservarse para los casos graves, porque slo se pueden administrar una determinada cantidad de veces. CALOR Y FRO   El fro (con hielo) debe aplicarse durante 10 a 15 minutos cada 2  3 horas para reducir la inflamacin y Conservation officer, historic buildings e inmediatamente despus de cualquier actividad que agrava los sntomas. Utilice bolsas o un masaje de hielo.  El calor puede usarse antes de Neurosurgeon y [place] fortalecimiento indicadas por el profesional, le fisioterapeuta o Industrial/product designer. Utilice una bolsa trmica o un pao hmedo. SOLICITE ATENCIN MDICA SI:   Los sntomas empeoran o no mejoran en 2 a 4 semanas, an realizando Lexicographer.  Presenta adormecimiento o debilidad en alguna de las piernas.  Prdida del control del intestino o de la vejiga.  Luego de la ciruga observa lo siguiente: fiebre, dolor intenso, hinchazn, enrojecimiento, drena lquido o sangra en la regin de la herida.  Desarrolla nuevos e inexplicables sntomas. (Los medicamentos utilizados en el tratamiento  le ocasionan efectos secundarios). Vandiver personas con dolor de espalda baja encuentran que sus sntomas empeoran al doblarse hacia adelante (flexin) o al arquear la regin inferior de la espalda (extensin). Los ejercicios que le ayudarn a Investment banker, operational sus sntomas se Furniture conservator/restorer.  El mdico, fisioterapeuta o Radiation protection practitioner ayudarn a Teacher, adult education qu ejercicios sern de ayuda para resolver su dolor de espalda. No realice ningn ejercicio sin consultarlo antes con el profesional. Discontine los ejercicios que empeoran sus sntomas, hasta que hable con el  mdico. Si siente dolor, entumecimiento u hormigueo que [employer] glteos, piernas o pies, el objetivo de esta terapia es que estos sntomas se acerquen a la espalda y Occupational hygienist. A veces, estos sntomas en las piernas mejoran, pero el dolor de espalda empeora. Este suele ser un indicio de progreso en su rehabilitacin. Asegrese de que estar atento a cualquier cambio en sus sntomas y las actividades que ha [employer] 24 horas antes del cambio. Compartir esta informacin con su mdico le permitir un mejor tratamiento para tratar su enfermedad. Estos ejercicios le ayudarn en la recuperacin de la lesin. Los sntomas podrn aliviarse con o sin asistencia adicional de su mdico, fisioterapeuta o Administrator, sports. Al completar estos ejercicios, recuerde:   Restaurar la flexibilidad del tejido ayuda a que las articulaciones recuperen el movimiento normal. Esto permite que el movimiento y la actividad sea ms saludables y menos dolorosos.  Para que sea efectiva, cada elongacin debe realizarse durante al menos 30 segundos.  La elongacin nunca debe ser dolorosa. Deber sentir slo un alargamiento o distensin suave del tejido que estira. EJERCICIOS DE AMPLITUD DE MOVIMIENTOS Y ELONGACIN: ELONGACION Flexin - una rodilla al pecho  Recustese en una cama dura o sobre el piso, con ambas piernas extendidas al frente.  Manteniendo una pierna en contacto con el piso, lleve la rodilla opuesta al pecho. Mantenga la pierna en esa posicin, sostenindola por la zona posterior del muslo o por la rodilla.  Presione hasta sentir un suave estiramiento en la cintura. Mantenga esta posicin durante __________ segundos.  Libere la pierna lentamente y repita el ejercicio con el lado opuesto. Reptalo __________ veces. Realice este ejercicio __________ veces por da.  ELONGACIN - Flexin, dos rodillas al pecho   Recustese en una cama dura o sobre el piso, con ambas piernas extendidas al  frente.  Manteniendo una pierna en contacto con el piso, lleve la rodilla opuesta al pecho.  Tense los msculos del estmago para apoyar la espalda y levante la otra rodilla [place]. Mantenga las piernas en su lugar y tmese por detrs [place].  Con ambas rodillas en el pecho, tire hasta que sienta un estiramiento en la parte trasera de la espalda. Mantenga esta posicin durante __________ segundos.  Tense los msculos del estmago y baje las piernas de a una por vez. Reptalo __________ veces. Realice este ejercicio __________ veces por da.  ELONGACIN - Rotacin de la zona baja del tronco  Recustese sobre una cama firme o sobre el suelo. [place], doble las rodillas de modo que ambas apunten hacia el techo y los pies queden bien apoyados en el piso.  Extienda los brazos a Teaching laboratory technician. Esto estabilizar la zona superior del cuerpo, manteniendo los hombros en contacto con el piso.  Con cuidado y lentamente deje caer ambas rodillas juntas hacia un  lado, hasta que sienta un suave estiramiento en la espalda baja. Mantenga esta posicin durante __________ segundos.  Tensione los [employer] del estmago para Nature conservation officer la cintura mientras lleva las rodillas nuevamente a la posicin [place]. Reptalo __________ veces. Realice este ejercicio __________ veces por da. EJERCICIOS DE AMPLITUD DE MOVIMIENTOS Y FLEXIBILIDAD: ELONGACIN - Extensin posicin prona sobre los codos  Acustese sobre el estmago sobre el piso, una cama ser muy blanda. Coloque las palmas a una distancia igual al ancho de los hombres y a la altura de la cabeza.  Coloque los codos bajo los hombros. Si siente dolor, colquese almohadas debajo del pecho.  Deje que su cuerpo se relaje, de modo que las caderas queden ms abajo y tengan ms contacto con el piso.  Mantenga esta posicin durante __________ segundos.  Vuelva lentamente a la  posicin plana sobre el piso. Reptalo __________ veces. Realice este ejercicio __________ veces por da.  [place] de brazos en posicin prona  Acustese sobre el [employer] piso, una cama ser [place]. Coloque las palmas a una distancia igual al ancho de los hombres y a la altura de la cabeza.  Mantenga la espalda tan relajada como pueda, enderece lentamente los codos mientras mantiene las caderas contra el suelo. Puede modificar la posicin de las manos para estar ms cmodo. A medida que gana movimiento, sus manos quedarn ms por debajo de los hombros.  Mantenga cada posicin durante __________ segundos.  Vuelva lentamente a la posicin plana sobre el piso. Reptalo __________ veces. Realice este ejercicio __________ veces por da.  AMPLITUD DE MOVIMIENTOS - Cuadrpedo [place] vertebral neutral  [place] y las rodillas en una superficie firme. Las manos deben quedar a la altura de los hombros y las rodillas [place]. Puede colocar algo debajo las rodillas para estar ms cmodo.  Haga caer la cabeza y apunte el cccix hacia el suelo debajo de usted. De este modo se redondear la cintura, en [place] similar a un gato enojado. Mantenga esta posicin durante __________ segundos.  Lentamente levante la cabeza y afloje el cccix para que se hunda el cuerpo en un gran arco, como un caballo.  Mantenga esta posicin durante __________ segundos.  Reptalo hasta sentir calor en la cintura.  Ahora encuentre su "punto ideal". Ser la posicin ms cmoda [employer]. En esta posicin es cuando su columna est neutral. Una vez que encuentre la posicin, tensione los msculos del estmago para sostener la zona inferior de la espalda.  Mantenga esta posicin durante __________ segundos. Reptalo __________ veces. Realice este ejercicio __________ veces por da.  EJERCICIOS DE FORTALECIMIENTO - Esguince de  la cintura Estos ejercicios le ayudarn en la recuperacin de la lesin. Estos ejercicios deben hacerse cerca de su "punto dulce". Este es el arco neutro, de la parte baja de la espalda, en algn lugar entre la posicin completamente redondeada y arqueada plenamente, que es la posicin menos dolorosa. Cuando se realiza en [employer] de seguridad del movimiento, estos ejercicios se pueden Risk manager para las personas que tienen una lesin basada en flexin o extensin. Con estos ejercicios, los sntomas podrn desaparecer con o sin mayor intervencin del profesional, el fisioterapeuta o Industrial/product designer. Al completar estos ejercicios, recuerde:   Los msculos pueden ganar la resistencia y la fuerza necesarias para las actividades diarias a travs de ejercicios controlados.  Realice los ejercicios como se lo indic  el mdico, el fisioterapeuta o Industrial/product designer. Aumente la resistencia y las repeticiones segn se le haya indicado.  Podr experimentar dolor o cansancio muscular, pero el dolor o molestia que trata de eliminar a travs de los ejercicios nunca debe empeorar. Si el dolor empeora, detngase y asegrese de que est siguiendo las directivas correctamente. Si an siente dolor luego de Optometrist lo ajustes necesarios, deber discontinuar el ejercicio hasta que pueda conversar con el profesional sobre el problema. FORTALECIMIENTO - Abdominales profundos - Inclinacin plvica  Recustese sobre una cama firme o sobre el suelo. [place], doble las rodillas de modo que ambas apunten hacia el techo y los pies queden bien apoyados en el piso.  Tensione la zona baja de los msculos abdominales para presionar la Materials engineer. Este movimiento har rotar su pelvis de modo que el cccix quede hacia arriba y no apuntando a los pies o hacia el piso. Con una tensin suave y respiracin pareja, mantenga esta posicin durante __________ segundos. Reptalo __________ veces. Realice este  ejercicio __________ veces por da.  FORTALECIMIENTO - Abdominales encogimiento abdominal.  Recustese sobre una cama firme o sobre el suelo. [place], doble las rodillas de modo que ambas apunten hacia el techo y los pies queden bien apoyados en el piso. [place].  Apunte suavemente con la barbilla hacia abajo, sin doblar el cuello.  Tensione los abdominales y eleve lentamente el tronco la altura suficiente para despegar los omplatos. Si se eleva ms, pondr tensin excesiva en la cintura y esto no fortalecer ms los abdominales.  Controle la vuelta a la posicin inicial. Reptalo __________ veces. Realice este ejercicio __________ veces por da.  EN CUATRO MIEMBROS - Cuadrpedo, elevacin de miembro superior e inferior opuestos   [employer] y las rodillas en una superficie firme. Las manos deben quedar a la altura de los hombros y las rodillas [place]. Puede colocar algo debajo las rodillas para estar ms cmodo.  Encuentre la posicin neutral de la columna vertebral y Heritage manager los msculos abdominales de modo que pueda mantener esta posicin. Los hombros y las caderas deben formar un rectngulo paralelo con el suelo y recto.  Manteniendo el tronco firme, eleve la mano derecha a la altura del hombro y luego eleve la pierna izquierda a la altura de la cadera. Asegrese de no contener la respiracin. Mantenga esta posicin durante __________ segundos.  Con los msculos abdominales en tensin y la espalda firme, vuelva lentamente a la posicin inicial. Repita con el otro brazo y la otra pierna.  Reptalo __________ veces. Realice este ejercicio __________ veces por da. FUERZA - Abdominales y cudriceps - Levantar las piernas rectas  Recustese en una cama dura o sobre el piso, con ambas piernas extendidas al frente.  Deje una pierna en contacto con el suelo y doble la otra rodilla de manera que el pie quede  contra el suelo.  Encuentre la posicin neutral de la columna vertebral y Heritage manager los msculos abdominales de modo que pueda mantener esta posicin.  Levante lentamente la pierna del suelo una 6 pulgadas y cuente [place] 74, asegrese de no contener la respiracin.  Mantega la columna en posicin neutral, y baje lentamente la pierna hasta el suelo. Repita el ejercicio con cada pierna __________ veces. Realice este ejercicio __________ veces por da. CONSIDERACIONES ACERCA DE LA POSTURA Y LA MECNICA DEL CUERPO  Esguince de la cintura Si [employer]  postura correcta cuando se encuentre de pie, sentado o realizando sus actividades, reducir el [employer] tejidos del cuerpo, y Advertising account executive a los tejidos lesionados la posibilidad de curarse y Engineering geologist las experiencias dolorosas. A continuacin se indican pautas generales para mejorar la postura. Su mdico o fisioterapeuta le dar instrucciones especficas segn sus necesidades. Al leer estas pautas recuerde:  Los ejercicios indicados por su mdico lo ayudarn a Scientist, product/process development flexibilidad y la fuerza para Theatre manager las posturas correctas.  La postura correcta proporciona el mejor entorno de trabajo para las articulaciones. Las articulaciones se desgastan menos cuando estn sostenidas adecuadamente por una columna vertebral en buena postura. Esto significa que su cuerpo estar ms sano y Network engineer.  La correcta postura debe practicarse en todas las actividades, especialmente al estar sentado o de pie durante [place]. Tambin es importante al realizar actividades repetitivas de bajo estrs (tipeo) o una actividad nica y pesada. POSICIONES DE Cathe Mons Tenga en cuenta cules son las posturas que ms dolor le causan al elegir una posicin de descanso. Si siente dolor con las actividades en que deba realizar una flexin (sentarse, inclinarse, detenerse, ponerse en cuclillas), elija una posicin que le permita descansar en una postura  menos flexionada. Evite curvarse en posicin fetal cuando se encuentre de lado. Si el dolor empeora con las actividades basadas en la extensin (estar de pie durante un tiempo prolongado, trabajar con las manos por arriba de la cabeza) evite descansar en Ardelia Mems posicin extendida durante mucho tiempo, como dormir sobre el [place]. La [employer] de las Artist cmodo el descanso sobre la columna vertebral en una posicin neutral, ni muy redondeada ni [place]. Recustese sobre su lado en una cama que no est hundida con una almohada entre las rodillas o sobre la espalda con una almohada bajo las rodillas, y sentir Lane. Tenga en cuenta que cualquier posicin en [employer], no importa si es una postura [place], puede provocarle rigidez. POSTURAS CORRECTAS PARA SENTARSE Con el fin de minimizar el estrs y Health and safety inspector en su columna, deber sentarse con la postura correcta. Sentarse con una buena postura debe ser algo sin esfuerzo para un cuerpo sano. Recuperar una buena postura es un proceso gradual. Muchas personas pueden trabajar ms cmodas mediante el uso de diferentes soportes hasta que tengan la flexibilidad y la fuerza para mantener esta postura por su cuenta. Al sentarse con la [employer], los odos deben estar sobre los hombros y los hombros [place]. Debe utilizar el respaldo de la silla para apoyar la espalda. La espalda estar en una posicin neutral, ligeramente arqueada. Puede colocar una pequea almohada o toalla doblada en la base de la espalda baja para apoyo.  Si trabaja en un escritorio, cree un ambiente que le proporciones un buen soporte y [place]. Sin apoyo adicional, msculos se cansan, lo que lleva a una tensin excesiva en las articulaciones y otros tejidos. Tenga en cuenta estas recomendaciones: SILLA:   La silla debe poder deslizarse por debajo del escritorio cuando su espalda tome contacto con el respaldo. Esto le permitir trabajar  ms cerca.  La altura de la silla debe permitirle que los ojos tengan el nivel de la parte superior del monitor y las manos estn ms abajo que los codos. POSICIN DEL CUERPO  Los pies deben tener contacto con el piso. Si no es posible, use un posapies.  Mantenga las [employer] hombros. Esto reducir el estrs en el cuello y en la cintura. POSTURAS  INCORRECTAS PARA SENTARSE Si se siente cansado e incapaz de asumir una postura sentada sana, no se eche hacia atrs. Esto pone una tensin excesiva en los tejidos de su espalda, y causa ms dao y Social research officer, government. [place] opciones ms saludables se incluyen:  El uso de ms apoyo, como una almohada lumbar.  Cambio de tareas, a algo que demande una posicin vertical o caminar.  Tomar una breve caminata.  Recostarse y Physicist, medical posicin neutral. DE PIE DURANTE UN TIEMPO PROLONGADO E INCLINADO LIGERAMENTE HACIA ADELANTE Cuando deba realizar una tarea que requiera inclinacin hacia adelante estando de pie en el mismo sitio durante mucho tiempo, coloque un pie en un objeto de 2 a 4 pulgadas de alto, para [employer]. Cuando ambos pies estn en el piso, la zona inferior de la espalda tiene a perder su ligera curvatura hacia adentro. Si esta curva se aplana (o se pronuncia demasiado) la espalda y las articulaciones experimentarn demasiado estrs, se fatigarn ms rpidamente y Therapist, sports.  POSTURAS CORRECTAS PARA ESTAR DE PIE Una postura adecuada de pie realizarse en todas las actividades diarias, incluso si slo toman un momento, como al [employer]. Como en la postura de sentado, los odos deben estar sobre los hombros y los hombros [place]. Deber mantener una ligera tensin en sus msculos abdominales para asegurar la columna vertebral. El cccix debe apuntar hacia el suelo, no detrs de su cuerpo, ya que resultara en una curvatura de la espalda sobre-extendida.  [place]  posturas incorrectas para estar de pie incluyen tener la cabeza hacia delante, las rodillas bloqueadas o una excesiva curvatura de la espalda. CAMINAR [place] en [place] erguida. Las [place], hombros y caderas deben estar alineados. ACTIVIDAD PROLONGADA EN UNA POSICIN FLEXIONADA Al completar una tarea que requiere que se doble la cintura hacia adelante o inclinarse sobre una superficie baja, trate de encontrar una manera de estabilizar 3 de cada 4 de sus miembros. Puede colocar una mano o el codo en el [place], o descansar una rodilla en la superficie en la que est apoyado. Esto le proporcionar ms estabilidad para que sus msculos no se cansen tan rpidamente. El [employer] rodillas [place], o ligeramente dobladas, tambin reducir el estrs en la espalda baja. TCNICAS CORRECTAS PARA LEVANTAR OBJETOS SI:   Asumir una postura amplia. Esto le proporcionar ms estabilidad y la oportunidad de acercarse lo ms posible al objeto que se est levantando.  Tense los abdominales para asegurar la columna vertebral. Doble las rodillas y las caderas. Manteniendo la espalda en una posicin neutral, haga el esfuerzo con los msculos de la pierna. Levntese con las piernas, manteniendo la espalda derecha.  Pruebe el peso de los objetos desconocidos antes de tratar de Special educational needs teacher.  Trate de [employer] codos hacia abajo y a los lados, con el fin de obtener la fuerza de los hombros al llevar un objeto.  Siempre pida ayuda a otra persona cuando deba levantar objetos pesados o incmodos. TCNICAS INCORRECTAS PARA LEVANTAR OBJETOS NO:   Bloquee rodillas al levantar, aunque sea un objeto pequeo.  Se doble ni gire. Gire sobre los pies ni los mueva cuando necesite cambiar de direccin.  Considere que no puede levantar incluso un clip de papel con seguridad, sin Chiropodist.   Esta informacin no tiene Marine scientist el consejo del mdico. Asegrese de hacerle al mdico cualquier  pregunta que tenga.   Document Released: 01/01/2006 Document Revised: 08/01/2014 Elsevier  Interactive Patient Education [employer].

## 2015-03-16 NOTE — Progress Notes (Signed)
Subjective:    Patient ID: Sheri Rice, female    DOB: 29-Dec-1955, 59 y.o.   MRN: [id number]  Chief Complaint  Patient presents with  . Establish Care    back pain that has been going on 8 months, swelling and pain in both knees    HPI:  Sheri Rice is a 59 y.o. female who  has a past medical history of GERD (gastroesophageal reflux disease) and Allergy. and presents today for an office visit to establish care.  1.) Knee pain - Associated pain located in her bilateral knees that has been going on for about 1 year. Described as inflammation and sharp and pulsating. Modifying factors Advil which does help a little. No trauma to either knee. Does work Barrister's clerk. No sounds/sensations heard or felt.   2.) Neck pain - Associated symptoms of pain located in her neck that goes down to her lowe back has been going on for about 8 months. Described as an ache and pulling sensation. Previous x-ray showed degenerative disc disease with some narrowing. Modifying factors include difclofenac. The severity of the pain is enough to disturb her sleep. Timing of the pain is worse in the morning when she first gets up.    No Known Allergies   No outpatient prescriptions prior to visit.   No facility-administered medications prior to visit.     Past Medical History  Diagnosis Date  . GERD (gastroesophageal reflux disease)   . Allergy      Past Surgical History  Procedure Laterality Date  . Dilation and curettage of uterus       Family History  Problem Relation Age of Onset  . Diabetes Mother   . Stroke Maternal Grandmother      Social History   Social History  . Marital Status: Married    Spouse Name: N/A  . Number of Children: 3  . Years of Education: 11   Occupational History  . Maintenace     Social History Main Topics  . Smoking status: Never Smoker   . Smokeless tobacco: Never Used  . Alcohol Use: No  . Drug Use: No  . Sexual Activity: Not on file   Other  Topics Concern  . Not on file   Social History Narrative     Review of Systems  Constitutional: Negative for fever and chills.  Musculoskeletal: Positive for back pain and neck pain.       Positive for knee pain  Neurological: Negative for weakness and numbness.      Objective:    BP 138/90 mmHg  Pulse 81  Temp(Src) 97.6 F (36.4 C) (Oral)  Resp 18  Ht 5\' 5"  (1.651 m)  Wt 153 lb 1.9 oz (69.455 kg)  BMI 25.48 kg/m2  SpO2 98% Nursing note and vital signs reviewed.  Physical Exam  Constitutional: She is oriented to person, place, and time. She appears well-developed and well-nourished. No distress.  Cardiovascular: Normal rate, regular rhythm, normal heart sounds and intact distal pulses.   Pulmonary/Chest: Effort normal and breath sounds normal.  Neurological: She is alert and oriented to person, place, and time.  Skin: Skin is warm and dry.  Psychiatric: She has a normal mood and affect. Her behavior is normal. Judgment and thought content normal.       Assessment & Plan:   Problem List Items Addressed This Visit      Musculoskeletal and Integument   DDD (degenerative disc disease), cervical - Primary    Previously noted to  have degenerative disc disease in her cervical spine on x-rays causing foraminal impingement. Start meloxicam for inflammation and cyclobenzaprine. Treat with heat and home exercise. Follow up in 1 month or sooner.       Relevant Medications   meloxicam (MOBIC) 15 MG tablet   cyclobenzaprine (FLEXERIL) 10 MG tablet   Osteoarthritis of both knees    Symptoms and exam consistent with osteoarthritis. Start meloxicam as needed for inflammation. Recommend heat/ice multiple times per day as needed. Start home exercise therapy. Follow-up if symptoms worsen or fail to improve.      Relevant Medications   meloxicam (MOBIC) 15 MG tablet   cyclobenzaprine (FLEXERIL) 10 MG tablet

## 2015-03-16 NOTE — Assessment & Plan Note (Signed)
Previously noted to have degenerative disc disease in her cervical spine on x-rays causing foraminal impingement. Start meloxicam for inflammation and cyclobenzaprine. Treat with heat and home exercise. Follow up in 1 month or sooner.

## 2015-03-16 NOTE — Assessment & Plan Note (Signed)
Symptoms and exam consistent with osteoarthritis. Start meloxicam as needed for inflammation. Recommend heat/ice multiple times per day as needed. Start home exercise therapy. Follow-up if symptoms worsen or fail to improve.

## 2015-03-19 ENCOUNTER — Ambulatory Visit: Payer: Self-pay | Admitting: Family

## 2015-04-18 ENCOUNTER — Ambulatory Visit (INDEPENDENT_AMBULATORY_CARE_PROVIDER_SITE_OTHER): Payer: Commercial Managed Care - HMO | Admitting: Family

## 2015-04-18 ENCOUNTER — Encounter: Payer: Self-pay | Admitting: Family

## 2015-04-18 VITALS — BP 124/84 | HR 77 | Temp 97.6°F | Resp 16 | Ht 65.0 in | Wt 157.8 lb

## 2015-04-18 DIAGNOSIS — R3915 Urgency of urination: Secondary | ICD-10-CM | POA: Insufficient documentation

## 2015-04-18 DIAGNOSIS — M5030 Other cervical disc degeneration, unspecified cervical region: Secondary | ICD-10-CM

## 2015-04-18 LAB — POCT URINALYSIS DIPSTICK
Bilirubin, UA: NEGATIVE
Glucose, UA: NEGATIVE
KETONES UA: NEGATIVE
Leukocytes, UA: NEGATIVE
Nitrite, UA: NEGATIVE
PH UA: 6
PROTEIN UA: NEGATIVE
SPEC GRAV UA: 1.015
UROBILINOGEN UA: NEGATIVE

## 2015-04-18 MED ORDER — MIRABEGRON ER 25 MG PO TB24: 25.0000 mg | ORAL_TABLET | Freq: Every day | ORAL | Status: DC

## 2015-04-18 NOTE — Progress Notes (Signed)
Pre visit review using our clinic review tool, if applicable. No additional management support is needed unless otherwise documented below in the visit note. 

## 2015-04-18 NOTE — Assessment & Plan Note (Signed)
In office urinalysis negative for leukocytes, nitrites, and hematuria. Symptoms consistent with overactive bladder /urinary urgency. Start Myrbetriq. Follow up in 1 month or sooner needed if symptoms worsen or fail to improve.

## 2015-04-18 NOTE — Assessment & Plan Note (Signed)
Continues to experience low back pain which is improved with medications taken as prescribed with no adverse side effects. Recommended continue home exercise therapy. Possible referral for sports medicine for SI joint manipulation as needed. Continue current dosage of meloxicam and cyclobenzaprine. Follow-up in 6 weeks.

## 2015-04-18 NOTE — Progress Notes (Signed)
Subjective:    Patient ID: Sheri Rice, female    DOB: 07/15/1955, 60 y.o.   MRN: [id number]  Chief Complaint  Patient presents with  . Follow-up    follow up on back and knee pain, the medication seems to be helping    HPI:  Sheri Rice is a 60 y.o. female who  has a past medical history of GERD (gastroesophageal reflux disease) and Allergy. and presents today for a follow up.  1.) Low back - Recently evaluated in the office for degenerative disc disease and primary osteoarthritis and started on meloxicam and cyclobenzaprine. Takes medications as prescribed and denies adverse side effects. Reports that her back and knee pain seemed to be slightly improved with the medication regimen. She has not done the exercises very consistently. Able to complete her activities of daily living and work with improvement. Does continue to have difficulty with some increased stairs.  2.) Urinary urgency -  Associated symptom of urinary urgency has been going on for about 4 months is has been slightly worsening. Describes that she has to go to urgently. Frequency is as much as every half hour. Modifying factors include limiting water which helps to decrease it. Denies dysuria, fevers or chills.   No Known Allergies   Current Outpatient Prescriptions on File Prior to Visit  Medication Sig Dispense Refill  . cyclobenzaprine (FLEXERIL) 10 MG tablet Take 0.5-1 tablets (5-10 mg total) by mouth 3 (three) times daily as needed for muscle spasms. 90 tablet 0  . meloxicam (MOBIC) 15 MG tablet Take 1 tablet (15 mg total) by mouth daily. 30 tablet 1  . ranitidine (ZANTAC) 150 MG capsule Take 150 mg by mouth 2 (two) times daily.     No current facility-administered medications on file prior to visit.     Past Surgical History  Procedure Laterality Date  . Dilation and curettage of uterus      Past Medical History  Diagnosis Date  . GERD (gastroesophageal reflux disease)   . Allergy       Review of Systems  Constitutional: Negative for fever and chills.  Genitourinary: Positive for urgency and frequency. Negative for dysuria and hematuria.  Musculoskeletal: Positive for back pain and arthralgias.      Objective:    BP 124/84 mmHg  Pulse 77  Temp(Src) 97.6 F (36.4 C) (Oral)  Resp 16  Ht 5\' 5"  (1.651 m)  Wt 157 lb 12.8 oz (71.578 kg)  BMI 26.26 kg/m2  SpO2 96% Nursing note and vital signs reviewed.  Physical Exam  Constitutional: She is oriented to person, place, and time. She appears well-developed and well-nourished. No distress.  Cardiovascular: Normal rate, regular rhythm, normal heart sounds and intact distal pulses.   Pulmonary/Chest: Effort normal and breath sounds normal.  Abdominal: There is no CVA tenderness.  Musculoskeletal:  Lumbar spine - no obvious deformity, discoloration, or edema. Tenderness elicited over lumbar paraspinal musculature and sacroiliac joint. Range of motion is within normal limits with discomfort noted with lateral bending. Distal pulses, reflexes, and sensation are intact and appropriate. Positive Faber's test negative.  Straight leg raise test.   Neurological: She is alert and oriented to person, place, and time.  Skin: Skin is warm and dry.  Psychiatric: She has a normal mood and affect. Her behavior is normal. Judgment and thought content normal.       Assessment & Plan:   Problem List Items Addressed This Visit      Musculoskeletal and Integument  DDD (degenerative disc disease), cervical - Primary     Continues to experience low back pain which is improved with medications taken as prescribed with no adverse side effects. Recommended continue home exercise therapy. Possible referral for sports medicine for SI joint manipulation as needed. Continue current dosage of meloxicam and cyclobenzaprine. Follow-up in 6 weeks.        Other   Urinary urgency     In office urinalysis negative for leukocytes, nitrites, and  hematuria. Symptoms consistent with overactive bladder /urinary urgency. Start Myrbetriq. Follow up in 1 month or sooner needed if symptoms worsen or fail to improve.       Relevant Medications   mirabegron ER (MYRBETRIQ) 25 MG TB24 tablet   Other Relevant Orders   POCT urinalysis dipstick (Completed)

## 2015-04-18 NOTE — Patient Instructions (Signed)
Thank you for choosing [facility].  Summary/Instructions:  Your prescription(s) have been submitted to your pharmacy or been printed and provided for you. Please take as directed and contact our office if you believe you are having problem(s) with the medication(s) or have any questions.  Please stop by the lab on the basement level of the building for your blood work. Your results will be released to [place] (or called to you) after review, usually within 72 hours after test completion. If any changes need to be made, you will be notified at that same time.  If your symptoms worsen or fail to improve, please contact our office for further instruction, or in case of emergency go directly to the emergency room at the closest medical facility.   Disfuncin en la articulacin sacroilaca (Sacroiliac Joint Dysfunction) La disfuncin en la articulacin sacroilaca es un trastorno que causa inflamacin en uno o ambos lados de dicha articulacin. La articulacin sacroilaca conecta la parte inferior de la columna (sacro) con las dos secciones superiores de la pelvis (ilion). Esta afeccin causa un fuerte dolor o una sensacin de ardor en la parte inferior de la columna. En algunos casos, el dolor tambin puede extenderse a uno o ambos glteos, a uno o ambos costados de la cadera, o por las piernas. CAUSAS Este trastorno puede ser causado porGlennis Brink. Durante el [place], las articulaciones sacroilacas reciben una presin adicional porque la pelvis se ensancha.  Una lesin, por ejemplo:  Accidentes automovilsticos.  Lesiones relacionadas con deportes.  Lesiones relacionadas con Leander Rams.  Una pierna ms corta que la [place].  Trastornos que [employer], como los siguientes:  Artritis reumatoide.  Gota.  Artritis psorisica.  Infeccin en las articulaciones (artritis sptica). A veces, se desconoce la causa de la disfuncin en la articulacin  sacroilaca. SNTOMAS Los sntomas de esta afeccin incluyen lo siguiente:  Dolor o ardor en la parte inferior de la espalda. El dolor tambin puede extenderse a otras reas, como las siguientes:  Los glteos.  La ingle.  Los muslos y las piernas.  Espasmos musculares en las reas doloridas o alrededor de ellas.  Aumento del dolor al estar de pie, caminar, correr, subir escaleras, agacharse o levantarse. DIAGNSTICO El mdico har un examen fsico y Ardelia Mems historia clnica. Durante el examen, el mdico puede mover una o ambas piernas a diferentes posiciones para constatar si le duele. Pueden hacerle varios estudios para verificar el diagnstico, incluidos los siguientes:  Pruebas de diagnstico por imgenes para buscar otras causas del dolor. Estas pueden incluir lo siguiente:  Resonancia magntica.  Tomografa computarizada.  [place] sea.  Inyeccin de diagnstico. Con una aguja, se inyecta un anestsico en la Engineering geologist. Si despus de Copywriter, advertising se detiene o se interrumpe de forma temporal, esto puede indicar que el problema es una disfuncin en la Engineering geologist. TRATAMIENTO El tratamiento depender de la causa y la gravedad de la afeccin. Las opciones de tratamiento son las siguientes:  Aplicacin de hielo o calor en la parte inferior de la espalda. Esto puede ayudar a Dietitian y los espasmos musculares.  Medicamentos para Best boy o la inflamacin, o para [employer].  Uso de un soporte para la espalda (soporte sacroilaco) como ayuda para dar un sostn a la Public house manager la espalda va mejorando.  Fisioterapia para aumentar la fuerza muscular alrededor de la articulacin y la flexibilidad de Water engineer. Esta tambin puede estar dirigida a aprender posturas corporales [employer]  y formas de moverse para Merchandiser, retail.  Manipulacin directa de la Clinical research associate.  Inyecciones de corticoides en la articulacin para Best boy y reducir la hinchazn.  Ablacin por radiofrecuencia para destruir los nervios que llevan mensajes de dolor desde la articulacin.  Uso de un dispositivo que emite estimulacin elctrica para reducir Insurance risk surveyor.  Ciruga para Glass blower/designer tornillos y placas que limitan o evitan el movimiento de Water engineer. Esto es raro. INSTRUCCIONES PARA EL CUIDADO EN EL HOGAR  Descanse todo lo que sea necesario. Limite sus actividades segn las indicaciones del [place] los medicamentos solamente como se lo haya indicado el mdico.  Si se lo indican, aplique hielo en la zona afectada:  Ponga el hielo en una bolsa plstica.  Coloque una toalla entre la piel y la bolsa de hielo.  Coloque el hielo durante 71minutos, 2 a 3veces por Training and development officer.  Use una almohadilla trmica o una compresa de calor hmedo como se lo haya indicado el mdico.  Haga ejercicio como se lo haya indicado el fisioterapeuta o su mdico.  Concurra a todas las visitas de control como se lo haya indicado el mdico. Esto es importante. SOLICITE ATENCIN MDICA SI:  El dolor no se alivia con los [employer].  Tiene fiebre.  El dolor es intenso y aumenta cada vez ms. SOLICITE ATENCIN MDICA DE INMEDIATO SI:  Siente debilidad, adormecimiento u hormigueo en los pies o en las piernas.  Pierde el control de la vejiga o del intestino.   Esta informacin no tiene Marine scientist el consejo del mdico. Asegrese de hacerle al mdico cualquier pregunta que tenga.   Document Released: 01/05/2013 Document Revised: 08/01/2014 Elsevier Interactive Patient Education 2016 [place] hiperactiva en adultos (Overactive Bladder, Adult) El trastorno de vejiga hiperactiva es un grupo de sntomas urinarios. Si tiene vejiga hiperactiva, puede sentir la necesidad repentina de orinar de inmediato. Despus de sentir esta  necesidad urgente, tambin puede tener prdida de orina si no puede llegar al bao con la rapidez suficiente (incontinencia urinaria). Estos sntomas pueden interferir con su trabajo diario y las actividades sociales. Adems, los sntomas de vejiga hiperactiva pueden despertarlo durante la noche. El trastorno de vejiga hiperactiva afecta las seales nerviosas entre la vejiga y el cerebro. La vejiga puede recibir la seal de vaciarse antes de que est llena. Los msculos muy sensibles tambin pueden hacer que pierda orina demasiado pronto. CAUSAS Las causas de la vejiga hiperactiva pueden ser varias: Las causas posibles son las siguientes:  Infeccin urinaria.  Infeccin de los tejidos cercanos, como la prstata.  Agrandamiento de la prstata.  Estar embarazada de ms de un beb (embarazo mltiple).  Ciruga en el tero o la uretra.  Clculos en la vejiga, inflamacin o tumores.  Consumir cafena o alcohol en exceso.  Ciertos medicamentos, en especial lo que se toman para ayudar al organismo a eliminar el lquido extra (diurticos) al aumentar la produccin de [place].  Debilidad de los msculos y nervios, especialmente a causa de lo siguiente:  Lesin en la mdula espinal.  Ictus.  Esclerosis mltiple.  La enfermedad de Parkinson.  Diabetes. Esto puede producir un volumen de orina elevado que llena la vejiga tan rpido que la necesidad urgente de Garment/textile technologist se desencadena en forma muy intensa.  Estreimiento. La acumulacin de demasiada cantidad de heces puede ejercer presin en la vejiga. FACTORES DE RIESGO Puede correr un mayor riesgo de desarrollar vejiga hiperactiva si usted:  Es un Public affairs consultant.  Fuma.  Est atravesando la menopausia.  Tiene problemas de prstata.  Tiene una enfermedad neurolgica, como ictus, demencia, enfermedad de Parkinson o esclerosis mltiple (EM).  Ingiere alimentos o bebidas que irritan la vejiga. Entre ellos se incluyen el alcohol, los alimentos  picantes y la cafena.  Tiene sobrepeso o es obeso. SIGNOS Y SNTOMAS  [employer] signos y los sntomas de vejiga hiperactiva se incluyen los siguientes:  Urgencia repentina e intensa de Garment/textile technologist.  Prdida de [place].  Orinar ocho o ms veces por da.  Despertarse dos o ms veces durante la noche para Garment/textile technologist. DIAGNSTICO El mdico puede sospechar la presencia de vejiga hiperactiva en funcin de los sntomas que [place]. El [employer] har un examen fsico y revisar su historia clnica. Pueden realizarle anlisis de [place] o de [place]. Por ejemplo, puede ser necesario realizar pruebas de la funcin de la vejiga para controlar la retencin de [place]. Es posible que tambin tenga que consultar a un mdico especialista en vas urinarias (urlogo). [place] para el trastorno de vejiga hiperactiva depende de la causa y la gravedad de su enfermedad. Ciertos tratamientos pueden Associate Professor del mdico o en la clnica. Tambin puede hacer cambios en su estilo de vida en su casa. [place] opciones se incluyen las siguientes: Tratamientos Statistician. El especialista utiliza sensores para ayudarlo a Personnel officer atento a las seales del cuerpo.  Llevar un registro diario de los momentos en que necesita orinar y qu sucede despus de la necesidad urgente de Garment/textile technologist. Esto puede ayudarlo a Electrical engineer.  Entrenamiento de la vejiga. Esto lo ayuda a aprender a Aeronautical engineer necesidad urgente de Garment/textile technologist al seguir un programa que lo obliga a Garment/textile technologist en intervalos regulares (vaciamiento cronometrado). Al principio, es posible que tenga que esperar unos minutos despus de sentir la necesidad urgente de Garment/textile technologist. Con el tiempo, debera poder [employer] con una hora de diferencia o ms.  Ejercicios de Kegel. Son ejercicios para fortalecer los msculos del piso plvico que sostienen la vejiga. La tonificacin de estos msculos puede ayudarlo a Chief Technology Officer  las micciones, aun si hay hiperactividad en los msculos de la vejiga. Un especialista le ensear cmo hacer estos ejercicios en forma correcta. Deber practicarlos diariamente.  Prdida de peso. Si es obeso o tiene sobrepeso, perder [employer] sntomas de vejiga hiperactiva. Hable con su mdico acerca de cmo perder peso y si hay algn programa o mtodo especfico ms eficaz para usted.  Cambio en la dieta. Esto podra ayudar si el estreimiento empeora el trastorno de vejiga hiperactiva. El mdico o nutricionista puede explicarle de qu forma puede hacer cambios en su dieta para Theatre stage manager estreimiento. Tambin es posible que tenga que consumir menos cantidad de alcohol y cafena, y beber otros lquidos en distintos momentos del da.  Dejar de fumar.  Usar apsitos para Tax adviser las prdidas mientras espera que otros tratamientos surtan [place]. Tratamientos fsicos  Estimulacin elctrica. Los electrodos envan pulsos elctricos suaves para [employer] nervios o los msculos que ayudan a Chief Technology Officer la vejiga. En algunos casos, los electrodos se colocan fuera del cuerpo. En otros casos, pueden colocarse en el interior del cuerpo (implante). Este tratamiento puede demorar varios meses en surtir [employer].  Dispositivos complementarios. Las mujeres pueden necesitar un dispositivo de plstico que calce en la vagina y sostenga la vejiga (pesario). Medicamentos Varios medicamentos pueden ayudar a tratar el trastorno de vejiga hiperactiva y por lo general se  utilizan junto con otros medicamentos. Algunos se inyectan en los msculos que participan en la miccin. Otros vienen en comprimidos. El mdico tambin puede indicarle lo siguiente:  Antiespasmdicos. Estos medicamentos bloquean las seales que los nervios envan a la vejiga. Esto evita que la vejiga elimine orina en el momento incorrecto.  Antidepresivos tricclicos. Estos tipos de antidepresivos tambin [employer] de la  vejiga. Ciruga  Puede implantarse un dispositivo que ayuda a Chief Technology Officer las seales nerviosas que indican cundo debe orinar.  Puede someterse a una ciruga de implante de electrodos para recibir Ship broker.  A veces, los casos muy graves de vejiga hiperactiva requieren de una ciruga para cambiar la forma de la vejiga. [place] los medicamentos solamente como se lo haya indicado el mdico.  Use los implantes o un pesario como se lo haya indicado el mdico.  Modifique su dieta o estilo de vida como se lo haya recomendado su mdico. Estos pueden incluir los siguientes:  Electronics engineer menos cantidad de lquido o beber en distintos momentos del Training and development officer. Si necesita orinar con frecuencia por las noches, es posible que tenga que dejar de beber lquidos apenas comienza la noche.  Reduzca la ingesta de cafena o alcohol. Ambos pueden empeorar el trastorno de vejiga hiperactiva. La cafena se encuentra en el caf, el t y los refrescos.  Haga ejercicios de Kegel para fortalecer los msculos.  Pierda peso si lo necesita.  Ingiera una dieta saludable y equilibrada para English as a second language teacher estreimiento.  Lleve un diario o libro de anotaciones para registrar la cantidad de lquidos que ingiere y cundo lo hace, y [place] cundo siente necesidad de Garment/textile technologist. Esto ayudar a su mdico a Administrator, arts. SOLICITE ATENCIN MDICA SI:  Los sntomas no mejoran despus de Chiropodist.  El dolor y [place].  Tiene necesidad urgente de orinar con mayor frecuencia.  Tiene fiebre. SOLICITE ATENCIN MDICA DE INMEDIATO SI: No puede controlar la vejiga para nada.   Esta informacin no tiene Marine scientist el consejo del mdico. Asegrese de hacerle al mdico cualquier pregunta que tenga.   Document Released: 03/03/2012 Document Revised: 04/07/2014 Elsevier Interactive Patient Education [employer].

## 2015-04-25 ENCOUNTER — Telehealth: Payer: Self-pay

## 2015-04-25 NOTE — Telephone Encounter (Signed)
Pharmacy states that Myrbetriq is not covered by pt's insurance. Alternative medications are Oxybutynin ER, Toviaz, or Oxytrol, please advise  PA for Myrbetriq [phone]

## 2015-04-26 MED ORDER — FESOTERODINE FUMARATE ER 4 MG PO TB24: 4.0000 mg | ORAL_TABLET | Freq: Every day | ORAL | Status: DC

## 2015-04-26 NOTE — Telephone Encounter (Signed)
Got pts pharmacy on file. Sending medication. Pt aware.

## 2015-04-26 NOTE — Telephone Encounter (Signed)
New prescription printed - need pharmacy.

## 2015-04-26 NOTE — Addendum Note (Signed)
Addended by: Delice Bison E on: 04/26/2015 03:11 PM   Modules accepted: Orders

## 2015-09-16 ENCOUNTER — Encounter (HOSPITAL_COMMUNITY): Payer: Self-pay | Admitting: *Deleted

## 2015-09-16 ENCOUNTER — Ambulatory Visit (HOSPITAL_COMMUNITY)
Admission: EM | Admit: 2015-09-16 | Discharge: 2015-09-16 | Disposition: A | Discharge status: Home / Self Care | Payer: Commercial Managed Care - HMO | Attending: Family Medicine | Admitting: Family Medicine

## 2015-09-16 DIAGNOSIS — G5792 Unspecified mononeuropathy of left lower limb: Secondary | ICD-10-CM

## 2015-09-16 DIAGNOSIS — M792 Neuralgia and neuritis, unspecified: Secondary | ICD-10-CM

## 2015-09-16 MED ORDER — VALACYCLOVIR HCL 1 G PO TABS: 1000.0000 mg | ORAL_TABLET | Freq: Three times a day (TID) | ORAL | Status: DC

## 2015-09-16 MED ORDER — GABAPENTIN 300 MG PO CAPS: 300.0000 mg | ORAL_CAPSULE | Freq: Three times a day (TID) | ORAL | Status: DC

## 2015-09-16 NOTE — ED Provider Notes (Signed)
CSN: [id number]     Arrival date & time 09/16/15  1647 History   First MD Initiated Contact with Patient 09/16/15 1757     Chief Complaint  Patient presents with  . Back Pain   (Consider location/radiation/quality/duration/timing/severity/associated sxs/prior Treatment) Patient is a 60 y.o. female presenting with back pain. The history is provided by the patient and a relative.  Back Pain Location:  Thoracic spine Quality:  Shooting and burning Pain severity:  Moderate Onset quality:  Sudden Duration:  2 days Chronicity:  New Context: not recent injury   Relieved by:  Nothing Exacerbated by: given muscle relaxer by lmd 2d ago    Past Medical History  Diagnosis Date  . GERD (gastroesophageal reflux disease)   . Allergy    Past Surgical History  Procedure Laterality Date  . Dilation and curettage of uterus     Family History  Problem Relation Age of Onset  . Diabetes Mother   . Stroke Maternal Grandmother    Social History  Substance Use Topics  . Smoking status: Never Smoker   . Smokeless tobacco: Never Used  . Alcohol Use: No   OB History    No data available     Review of Systems  Musculoskeletal: Positive for back pain.    Allergies  Review of patient's allergies indicates no known allergies.  Home Medications   Prior to Admission medications   Medication Sig Start Date End Date Taking? Authorizing Provider  cyclobenzaprine (FLEXERIL) 10 MG tablet Take 0.5-1 tablets (5-10 mg total) by mouth 3 (three) times daily as needed for muscle spasms. 03/16/15   Golden Circle, FNP  fesoterodine (TOVIAZ) 4 MG TB24 tablet Take 1 tablet (4 mg total) by mouth daily. 04/26/15   Golden Circle, FNP  gabapentin (NEURONTIN) 300 MG capsule Take 1 capsule (300 mg total) by mouth 3 (three) times daily. 09/16/15   Billy Fischer, MD  meloxicam (MOBIC) 15 MG tablet Take 1 tablet (15 mg total) by mouth daily. 03/16/15   Golden Circle, FNP  ranitidine (ZANTAC) 150 MG capsule  Take 150 mg by mouth 2 (two) times daily.    Historical Provider, MD  valACYclovir (VALTREX) 1000 MG tablet Take 1 tablet (1,000 mg total) by mouth 3 (three) times daily. 09/16/15   Billy Fischer, MD   Meds Ordered and Administered this Visit  Medications - No data to display  BP 116/74 mmHg  Pulse 68  Temp(Src) 97.9 F (36.6 C) (Oral)  Resp 12  SpO2 99% No data found.   Physical Exam  Constitutional: She is oriented to person, place, and time. She appears well-developed and well-nourished.  Cardiovascular: Normal rate, regular rhythm, normal heart sounds and intact distal pulses.   Pulmonary/Chest: Effort normal and breath sounds normal. She exhibits tenderness.  Left lat chest disaesthesias, without rash.  Abdominal: Bowel sounds are normal.  Neurological: She is alert and oriented to person, place, and time.  Skin: Skin is warm and dry.  Nursing note and vitals reviewed.   ED Course  Procedures (including critical care time)  Labs Review Labs Reviewed - No data to display  Imaging Review No results found.   Visual Acuity Review  Right Eye Distance:   Left Eye Distance:   Bilateral Distance:    Right Eye Near:   Left Eye Near:    Bilateral Near:         MDM   1. Neuralgia of left flank  Billy Fischer, MD 09/16/15 Tresa Moore

## 2015-09-16 NOTE — ED Notes (Signed)
Pt  Reports    l  Sided  Upper  Back  Pain     Pt    Reports      Was  Seen   sev  Days  Ago  -        Pain  Radiates       Around        She has  Pain        On palpation         She     Reports        Was  Put  On muscle  Relaxants         Still  Having  Pain     -

## 2015-09-18 ENCOUNTER — Ambulatory Visit: Payer: Commercial Managed Care - HMO | Admitting: Family

## 2015-09-18 ENCOUNTER — Encounter: Payer: Self-pay | Admitting: Family

## 2015-09-18 DIAGNOSIS — Z0289 Encounter for other administrative examinations: Secondary | ICD-10-CM

## 2015-09-19 ENCOUNTER — Emergency Department (HOSPITAL_COMMUNITY)
Admission: EM | Admit: 2015-09-19 | Discharge: 2015-09-19 | Disposition: A | Discharge status: Home / Self Care | Payer: Commercial Managed Care - HMO | Attending: Emergency Medicine | Admitting: Emergency Medicine

## 2015-09-19 ENCOUNTER — Encounter (HOSPITAL_COMMUNITY): Payer: Self-pay | Admitting: Emergency Medicine

## 2015-09-19 DIAGNOSIS — B029 Zoster without complications: Secondary | ICD-10-CM | POA: Insufficient documentation

## 2015-09-19 DIAGNOSIS — Z79899 Other long term (current) drug therapy: Secondary | ICD-10-CM | POA: Insufficient documentation

## 2015-09-19 MED ORDER — OXYCODONE-ACETAMINOPHEN 5-325 MG PO TABS
2.0000 | ORAL_TABLET | Freq: Once | ORAL | Status: AC
  Administered 2015-09-19: 2 via ORAL
  Filled 2015-09-19: qty 2

## 2015-09-19 MED ORDER — ONDANSETRON 4 MG PO TBDP
4.0000 mg | ORAL_TABLET | Freq: Once | ORAL | Status: AC
  Administered 2015-09-19: 4 mg via ORAL
  Filled 2015-09-19: qty 1

## 2015-09-19 MED ORDER — ONDANSETRON 4 MG PO TBDP: 4.0000 mg | ORAL_TABLET | Freq: Three times a day (TID) | ORAL | Status: DC | PRN

## 2015-09-19 MED ORDER — OXYCODONE-ACETAMINOPHEN 5-325 MG PO TABS: 2.0000 | ORAL_TABLET | ORAL | Status: DC | PRN

## 2015-09-19 NOTE — ED Provider Notes (Signed)
CSN: [id number]     Arrival date & time 09/19/15  [id number] History   First MD Initiated Contact with Patient 09/19/15 [phone]     Chief Complaint  Patient presents with  . Herpes Zoster      HPI  Patient presents for evaluation of left flank and abdominal pain. Symptoms for the last 5 days. Primary care physician started her on a muscle relaxant on day one. Seen in urgent care and diagnosed with probable herpetic zoster. Does not have lesions as yet at that time. Placed on gabapentin, and Valtrex. Continued pain here and she presents.  Past Medical History  Diagnosis Date  . GERD (gastroesophageal reflux disease)   . Allergy    Past Surgical History  Procedure Laterality Date  . Dilation and curettage of uterus     Family History  Problem Relation Age of Onset  . Diabetes Mother   . Stroke Maternal Grandmother    Social History  Substance Use Topics  . Smoking status: Never Smoker   . Smokeless tobacco: Never Used  . Alcohol Use: No   OB History    No data available     Review of Systems  Constitutional: Negative for fever, chills, diaphoresis, appetite change and fatigue.  HENT: Negative for mouth sores, sore throat and trouble swallowing.   Eyes: Negative for visual disturbance.  Respiratory: Negative for cough, chest tightness, shortness of breath and wheezing.   Cardiovascular: Negative for chest pain.       Pain from the thoracic spine done to the left breast now blistering.  Gastrointestinal: Negative for nausea, vomiting, abdominal pain, diarrhea and abdominal distention.  Endocrine: Negative for polydipsia, polyphagia and polyuria.  Genitourinary: Negative for dysuria, frequency and hematuria.  Musculoskeletal: Negative for gait problem.  Skin: Negative for color change, pallor and rash.  Neurological: Negative for dizziness, syncope, light-headedness and headaches.  Hematological: Does not bruise/bleed easily.  Psychiatric/Behavioral: Negative for behavioral  problems and confusion.      Allergies  Review of patient's allergies indicates no known allergies.  Home Medications   Prior to Admission medications   Medication Sig Start Date End Date Taking? Authorizing Provider  cyclobenzaprine (FLEXERIL) 10 MG tablet Take 0.5-1 tablets (5-10 mg total) by mouth 3 (three) times daily as needed for muscle spasms. 03/16/15   Golden Circle, FNP  fesoterodine (TOVIAZ) 4 MG TB24 tablet Take 1 tablet (4 mg total) by mouth daily. 04/26/15   Golden Circle, FNP  gabapentin (NEURONTIN) 300 MG capsule Take 1 capsule (300 mg total) by mouth 3 (three) times daily. 09/16/15   Billy Fischer, MD  meloxicam (MOBIC) 15 MG tablet Take 1 tablet (15 mg total) by mouth daily. 03/16/15   Golden Circle, FNP  ondansetron (ZOFRAN ODT) 4 MG disintegrating tablet Take 1 tablet (4 mg total) by mouth every 8 (eight) hours as needed for nausea. 09/19/15   Tanna Furry, MD  oxyCODONE-acetaminophen (PERCOCET/ROXICET) 5-325 MG tablet Take 2 tablets by mouth every 4 (four) hours as needed. 09/19/15   Tanna Furry, MD  ranitidine (ZANTAC) 150 MG capsule Take 150 mg by mouth 2 (two) times daily.    Historical Provider, MD  valACYclovir (VALTREX) 1000 MG tablet Take 1 tablet (1,000 mg total) by mouth 3 (three) times daily. 09/16/15   Billy Fischer, MD   Pulse 67  Temp(Src) 98 F (36.7 C) (Oral)  Resp 18  SpO2 99% Physical Exam  Constitutional: She is oriented to person, place, and time. She  appears well-developed and well-nourished. No distress.  HENT:  Head: Normocephalic.  Eyes: Conjunctivae are normal. Pupils are equal, round, and reactive to light. No scleral icterus.  Neck: Normal range of motion. Neck supple. No thyromegaly present.  Cardiovascular: Normal rate and regular rhythm.  Exam reveals no gallop and no friction rub.   No murmur heard. Pulmonary/Chest: Effort normal and breath sounds normal. No respiratory distress. She has no wheezes. She has no rales.       Abdominal: Soft. Bowel sounds are normal. She exhibits no distension. There is no tenderness. There is no rebound.  Musculoskeletal: Normal range of motion.  Neurological: She is alert and oriented to person, place, and time.  Skin: Skin is warm and dry. No rash noted.  Psychiatric: She has a normal mood and affect. Her behavior is normal.    ED Course  Procedures (including critical care time) Labs Review Labs Reviewed - No data to display  Imaging Review No results found. I have personally reviewed and evaluated these images and lab results as part of my medical decision-making.   EKG Interpretation None      MDM   Final diagnoses:  Herpes zoster    I agree with the diagnosis of herpetic zoster. She is on Valtrex, and gabapentin prescribed by urgent care physician. We'll give Percocet here prescription for same.    Tanna Furry, MD 09/19/15 1003

## 2015-09-19 NOTE — Discharge Instructions (Signed)
Continue medications prescribed to you at urgent care. Keep lesions covered. Oxycodone/Percocet as needed for pain.   Culebrilla (Shingles) La culebrilla, tambin conocida como herpes zster, es una infeccin que causa una erupcin dolorosa en la piel y ampollas llenas de lquido. La culebrilla no est relacionada con el herpes genital, que es una enfermedad de transmisin sexual.   Solo se manifiesta en personas que:  Tuvieron varicela.  Recibieron la vacuna contra la varicela. (Esto es poco frecuente). CAUSAS La causa de la culebrilla es el virus de la varicela zster (VVZ), el mismo virus que causa la varicela. Despus de la exposicin al virus de la varicela zster, este permanece en el organismo en un estado inactivo (latente). La culebrilla aparece si el virus se reactiva. Esto puede ocurrir muchos aos despus de la exposicin inicial al virus. No se conocen las causas por las que este virus se reactiva. [place] que tuvieron varicela o recibieron la vacuna contra la varicela estn en riesgo de tener culebrilla. La infeccin es ms frecuente en las personas que:  Tienen ms de 50aos.  Tienen el sistema de defensa del organismo (sistema inmunitario) debilitado, como en los enfermos con VIH, sida o cncer.  Toman medicamentos que debilitan el sistema inmunitario, como los medicamentos para trasplantes.  Estn sometidas a un gran estrs. SNTOMAS Los primeros sntomas de esta afeccin pueden incluir picazn, hormigueo o dolor en una zona de la piel. Este dolor se puede describir como ardor, punzante o pulstil. Unos das o semanas despus de que comienzan los sntomas, aparece una erupcin rojiza y dolorosa en un lado del cuerpo en un patrn con forma de cinto o de banda. Finalmente, la erupcin se convierte en ampollas llenas de lquido que se abren, forman costras y se secan en dos o tres [place]. En cualquier momento durante la infeccin, puede presentar  lo siguiente:  Cristy Hilts.  Escalofros.  Dolor de [place].  Malestar estomacal. DIAGNSTICO Esta afeccin se diagnostica con un examen de la piel. En algunos casos, se extraen muestras de piel o del lquido de las ampollas antes de definir el diagnstico. Estas muestras se examinan con el microscopio o se envan al laboratorio para su anlisis. TRATAMIENTO No hay una cura especfica para esta afeccin. El mdico probablemente le recete medicamentos para ayudarlo a Financial controller, a recuperarse ms rpido y a Customer service manager a Barrister's clerk. Entre los medicamentos se incluyen los siguientes:  Medicamentos antivirales.  Antiinflamatorios.  Analgsicos. Si la zona afectada est en el rostro, podrn derivarlo a un especialista, como un mdico especialista en ojos (oftalmlogo) o en odos, nariz y Investment banker, operational (otorrinolaringlogo) para evitar problemas oculares, dolor crnico o discapacidad. [place] los medicamentos solamente como se lo haya indicado el mdico.  Aplique una crema anestsica o una para calmar la picazn en la zona afectada segn las indicaciones del mdico. Cuidado de la erupcin y las ampollas  Tome un bao de agua fra o aplique compresas fras en la zona de la erupcin o las ampollas como se lo haya indicado el mdico. Esto aliviar el dolor y Cabin crew.  Mantenga la zona de la erupcin cubierta con una venda (vendaje). Use ropa holgada para ayudar a Best boy del roce con la erupcin.  Mantenga la erupcin y las ampollas limpias con jabn suave y agua fresca o como se lo indique el mdico.  Controle la erupcin todos los das para detectar signos de infeccin.  Estos signos incluyen enrojecimiento, hinchazn y dolor que perdura o [place].  No pellizque las ampollas.  No se rasque la zona de la erupcin. Instrucciones generales  Haga reposo segn las indicaciones del mdico.  Concurra a todas las visitas  de control como se lo haya indicado el mdico. Esto es importante.  Hasta tanto las ampollas formen costras, la infeccin puede causar varicela en las personas que nunca la tuvieron o no se vacunaron contra la varicela. Para impedir que esto suceda, evite el contacto con [employer], en especial:  Bebs.  Embarazadas.  Nios que Haematologist.  Personas mayores que han recibido un trasplante.  Personas con enfermedades crnicas, como leucemia y sida. SOLICITE ATENCIN MDICA SI:  El dolor no se alivia con los [employer].  El dolor no mejora despus de que la erupcin desaparece.  La erupcin parece infectada. Los signos de infeccin incluyen enrojecimiento, hinchazn y dolor que perdura o [place]. SOLICITE ATENCIN MDICA DE INMEDIATO SI:  La erupcin aparece en el rostro o la nariz.  Tiene dolor en el rostro, en la zona de los ojos o tiene prdida de la sensibilidad en un lado del rostro.  Siente dolor o un zumbido en el odo.  Tiene prdida del gusto.  La afeccin empeora.   Esta informacin no tiene Marine scientist el consejo del mdico. Asegrese de hacerle al mdico cualquier pregunta que tenga.   Document Released: 12/25/2004 Document Revised: 04/07/2014 Elsevier Interactive Patient Education [employer].

## 2015-09-19 NOTE — ED Notes (Signed)
Pt to ER by private vehicle with daughter. Recently diagnosed with shingles Sunday at [facility]. Has been taking valcyclovir and gabapentin as prescribed but reports has not been able to sleep due to pain. Pt has rash present to to left abdomen and wrapping to left back. Pt is alert and oriented x4.

## 2015-09-19 NOTE — ED Notes (Signed)
Patient undressed, in gown, on monitor, continuous pulse oximetry and blood pressure cuff; visitor at bedside 

## 2015-09-25 ENCOUNTER — Ambulatory Visit (INDEPENDENT_AMBULATORY_CARE_PROVIDER_SITE_OTHER): Payer: Commercial Managed Care - HMO | Admitting: Family

## 2015-09-25 ENCOUNTER — Encounter: Payer: Self-pay | Admitting: Family

## 2015-09-25 VITALS — BP 140/92 | HR 79 | Temp 97.5°F | Resp 18 | Ht 65.0 in | Wt 156.0 lb

## 2015-09-25 DIAGNOSIS — B029 Zoster without complications: Secondary | ICD-10-CM

## 2015-09-25 MED ORDER — OXYCODONE-ACETAMINOPHEN 5-325 MG PO TABS: 2.0000 | ORAL_TABLET | ORAL | Status: DC | PRN

## 2015-09-25 MED ORDER — LIDOCAINE 5 % EX OINT: 1.0000 "application " | TOPICAL_OINTMENT | Freq: Three times a day (TID) | CUTANEOUS | Status: DC | PRN

## 2015-09-25 MED ORDER — GABAPENTIN 300 MG PO CAPS: ORAL_CAPSULE | ORAL | Status: DC

## 2015-09-25 NOTE — Progress Notes (Signed)
Subjective:    Patient ID: Sheri Rice, female    DOB: [birth year], 60 y.o.   MRN: [id number]  Chief Complaint  Patient presents with  . Follow-up    still having pain from the shingle on the right side has been on medication for over a week now and still has rash    HPI:  Sheri Rice is a 60 y.o. female who  has a past medical history of GERD (gastroesophageal reflux disease) and Allergy. and presents today for a follow up office visit.   Recently evaluated in the urgent care and emergency department for evaluation of left flank pain where she was diagnosed with herpes zoster. She was placed on gabapentin and Valtrex. Physical exam in the emergency department with pain from thoracic spine to the left breast with blistering noted. Emergency department confirmed herpes zoster. She was given Percocet as needed for pain. All ED records were reviewed in detail.  She continues to experience pain located on the left side with continued rash. Reports taking the valacyclovir as prescribed without adverse side effects. She also reports taking the gabapentin as prescribed with minimal improvements but has run out of the medication. Overall the course of symptoms has gradually improved since completing the valacyclovir. Pain is described as burning.   No Known Allergies   Current Outpatient Prescriptions on File Prior to Visit  Medication Sig Dispense Refill  . cyclobenzaprine (FLEXERIL) 10 MG tablet Take 0.5-1 tablets (5-10 mg total) by mouth 3 (three) times daily as needed for muscle spasms. 90 tablet 0  . fesoterodine (TOVIAZ) 4 MG TB24 tablet Take 1 tablet (4 mg total) by mouth daily. 30 tablet 0  . meloxicam (MOBIC) 15 MG tablet Take 1 tablet (15 mg total) by mouth daily. 30 tablet 1  . ondansetron (ZOFRAN ODT) 4 MG disintegrating tablet Take 1 tablet (4 mg total) by mouth every 8 (eight) hours as needed for nausea. 6 tablet 0  . ranitidine (ZANTAC) 150 MG capsule Take 150 mg by mouth  2 (two) times daily.    . valACYclovir (VALTREX) 1000 MG tablet Take 1 tablet (1,000 mg total) by mouth 3 (three) times daily. 21 tablet 0   No current facility-administered medications on file prior to visit.    Review of Systems  Constitutional: Negative for fever and chills.  Skin: Positive for rash.  Neurological:       Positive for neuropathic pain.      Objective:    BP 140/92 mmHg  Pulse 79  Temp(Src) 97.5 F (36.4 C) (Oral)  Resp 18  Ht 5\' 5"  (1.651 m)  Wt 156 lb (70.761 kg)  BMI 25.96 kg/m2  SpO2 97% Nursing note and vital signs reviewed.  Physical Exam  Constitutional: She is oriented to person, place, and time. She appears well-developed and well-nourished. No distress.  Cardiovascular: Normal rate, regular rhythm, normal heart sounds and intact distal pulses.   Pulmonary/Chest: Effort normal and breath sounds normal.  Neurological: She is alert and oriented to person, place, and time.  Skin: Skin is warm and dry. Rash (Vesicular rash located dermatomal pattern appears dried and scabbing over.) noted.  Psychiatric: She has a normal mood and affect. Her behavior is normal. Judgment and thought content normal.       Assessment & Plan:   Problem List Items Addressed This Visit      Other   Herpes zoster - Primary    Symptoms and exam consistent with herpes zoster which appears resolving as  lesions appeared scabbing and drying up. Continues to experience postherpetic neuralgia. Refill gabapentin and start lidocaine cream. Refill oxycodone-acetaminophen as needed for discomfort not controlled by gabapentin and lidocaine. Follow-up if symptoms worsen or do not improve.      Relevant Medications   lidocaine (XYLOCAINE) 5 % ointment   oxyCODONE-acetaminophen (PERCOCET/ROXICET) 5-325 MG tablet   gabapentin (NEURONTIN) 300 MG capsule       I have changed Ms. Pann's gabapentin. I am also having her start on lidocaine. Additionally, I am having her maintain her  ranitidine, meloxicam, cyclobenzaprine, fesoterodine, valACYclovir, ondansetron, and oxyCODONE-acetaminophen.   Meds ordered this encounter  Medications  . lidocaine (XYLOCAINE) 5 % ointment    Sig: Apply 1 application topically 3 (three) times daily as needed.    Dispense:  35 g    Refill:  1    Order Specific Question:  Supervising Provider    Answer:  Pricilla Holm A [id number]  . oxyCODONE-acetaminophen (PERCOCET/ROXICET) 5-325 MG tablet    Sig: Take 2 tablets by mouth every 4 (four) hours as needed.    Dispense:  20 tablet    Refill:  0    Order Specific Question:  Supervising Provider    Answer:  Pricilla Holm A [id number]  . gabapentin (NEURONTIN) 300 MG capsule    Sig: Take 1 tablet by mouth daily x 1 day, increase to 2 tablets by mouth daily x 1 day, then increase to 3 tablets by mouth daily.    Dispense:  90 capsule    Refill:  0    Order Specific Question:  Supervising Provider    Answer:  Pricilla Holm A [id number]     Follow-up: Return if symptoms worsen or fail to improve.  Mauricio Po, FNP

## 2015-09-25 NOTE — Assessment & Plan Note (Signed)
Symptoms and exam consistent with herpes zoster which appears resolving as lesions appeared scabbing and drying up. Continues to experience postherpetic neuralgia. Refill gabapentin and start lidocaine cream. Refill oxycodone-acetaminophen as needed for discomfort not controlled by gabapentin and lidocaine. Follow-up if symptoms worsen or do not improve.

## 2015-09-25 NOTE — Patient Instructions (Signed)
Thank you for choosing [facility].  Summary/Instructions:  Your prescription(s) have been submitted to your pharmacy or been printed and provided for you. Please take as directed and contact our office if you believe you are having problem(s) with the medication(s) or have any questions.  If your symptoms worsen or fail to improve, please contact our office for further instruction, or in case of emergency go directly to the emergency room at the closest medical facility.    Culebrilla (Shingles) La culebrilla, tambin conocida como herpes zster, es una infeccin que causa una erupcin dolorosa en la piel y ampollas llenas de lquido. La culebrilla no est relacionada con el herpes genital, que es una enfermedad de transmisin sexual.   Solo se manifiesta en personas que:  Tuvieron varicela.  Recibieron la vacuna contra la varicela. (Esto es poco frecuente). CAUSAS La causa de la culebrilla es el virus de la varicela zster (VVZ), el mismo virus que causa la varicela. Despus de la exposicin al virus de la varicela zster, este permanece en el organismo en un estado inactivo (latente). La culebrilla aparece si el virus se reactiva. Esto puede ocurrir muchos aos despus de la exposicin inicial al virus. No se conocen las causas por las que este virus se reactiva. [place] que tuvieron varicela o recibieron la vacuna contra la varicela estn en riesgo de tener culebrilla. La infeccin es ms frecuente en las personas que:  Tienen ms de 50aos.  Tienen el sistema de defensa del organismo (sistema inmunitario) debilitado, como en los enfermos con VIH, sida o cncer.  Toman medicamentos que debilitan el sistema inmunitario, como los medicamentos para trasplantes.  Estn sometidas a un gran estrs. SNTOMAS Los primeros sntomas de esta afeccin pueden incluir picazn, hormigueo o dolor en una zona de la piel. Este dolor se puede describir como ardor,  punzante o pulstil. Unos das o semanas despus de que comienzan los sntomas, aparece una erupcin rojiza y dolorosa en un lado del cuerpo en un patrn con forma de cinto o de banda. Finalmente, la erupcin se convierte en ampollas llenas de lquido que se abren, forman costras y se secan en dos o tres [place]. En cualquier momento durante la infeccin, puede presentar lo siguiente:  Cristy Hilts.  Escalofros.  Dolor de [place].  Malestar estomacal. DIAGNSTICO Esta afeccin se diagnostica con un examen de la piel. En algunos casos, se extraen muestras de piel o del lquido de las ampollas antes de definir el diagnstico. Estas muestras se examinan con el microscopio o se envan al laboratorio para su anlisis. TRATAMIENTO No hay una cura especfica para esta afeccin. El mdico probablemente le recete medicamentos para ayudarlo a Financial controller, a recuperarse ms rpido y a Customer service manager a Barrister's clerk. Entre los medicamentos se incluyen los siguientes:  Medicamentos antivirales.  Antiinflamatorios.  Analgsicos. Si la zona afectada est en el rostro, podrn derivarlo a un especialista, como un mdico especialista en ojos (oftalmlogo) o en odos, nariz y Investment banker, operational (otorrinolaringlogo) para evitar problemas oculares, dolor crnico o discapacidad. [place] los medicamentos solamente como se lo haya indicado el mdico.  Aplique una crema anestsica o una para calmar la picazn en la zona afectada segn las indicaciones del mdico. Cuidado de la erupcin y las ampollas  Tome un bao de agua fra o aplique compresas fras en la zona de la erupcin o las ampollas como se lo haya indicado el mdico. Esto Theatre stage manager  dolor y Cabin crew.  Mantenga la zona de la erupcin cubierta con una venda (vendaje). Use ropa holgada para ayudar a Best boy del roce con la erupcin.  Mantenga la erupcin y las ampollas limpias con jabn suave  y agua fresca o como se lo indique el mdico.  Controle la erupcin todos los das para detectar signos de infeccin. Estos signos incluyen enrojecimiento, hinchazn y dolor que perdura o [place].  No pellizque las ampollas.  No se rasque la zona de la erupcin. Instrucciones generales  Haga reposo segn las indicaciones del mdico.  Concurra a todas las visitas de control como se lo haya indicado el mdico. Esto es importante.  Hasta tanto las ampollas formen costras, la infeccin puede causar varicela en las personas que nunca la tuvieron o no se vacunaron contra la varicela. Para impedir que esto suceda, evite el contacto con [employer], en especial:  Bebs.  Embarazadas.  Nios que Haematologist.  Personas mayores que han recibido un trasplante.  Personas con enfermedades crnicas, como leucemia y sida. SOLICITE ATENCIN MDICA SI:  El dolor no se alivia con los [employer].  El dolor no mejora despus de que la erupcin desaparece.  La erupcin parece infectada. Los signos de infeccin incluyen enrojecimiento, hinchazn y dolor que perdura o [place]. SOLICITE ATENCIN MDICA DE INMEDIATO SI:  La erupcin aparece en el rostro o la nariz.  Tiene dolor en el rostro, en la zona de los ojos o tiene prdida de la sensibilidad en un lado del rostro.  Siente dolor o un zumbido en el odo.  Tiene prdida del gusto.  La afeccin empeora.   Esta informacin no tiene Marine scientist el consejo del mdico. Asegrese de hacerle al mdico cualquier pregunta que tenga.   Document Released: 12/25/2004 Document Revised: 04/07/2014 Elsevier Interactive Patient Education [employer].

## 2015-09-25 NOTE — Progress Notes (Signed)
Pre visit review using our clinic review tool, if applicable. No additional management support is needed unless otherwise documented below in the visit note. 

## 2016-03-03 ENCOUNTER — Ambulatory Visit (INDEPENDENT_AMBULATORY_CARE_PROVIDER_SITE_OTHER): Payer: Commercial Managed Care - HMO | Admitting: Family

## 2016-03-03 ENCOUNTER — Encounter: Payer: Self-pay | Admitting: Family

## 2016-03-03 ENCOUNTER — Other Ambulatory Visit (INDEPENDENT_AMBULATORY_CARE_PROVIDER_SITE_OTHER): Payer: Commercial Managed Care - HMO

## 2016-03-03 DIAGNOSIS — Z23 Encounter for immunization: Secondary | ICD-10-CM | POA: Diagnosis not present

## 2016-03-03 DIAGNOSIS — R531 Weakness: Secondary | ICD-10-CM

## 2016-03-03 LAB — B12 AND FOLATE PANEL: Folate: >23.4 ng/mL (ref >5.9)

## 2016-03-03 LAB — TSH: TSH: 2.39 u[IU]/mL (ref 0.35–4.50)

## 2016-03-03 LAB — HEMOGLOBIN A1C: Hgb A1c MFr Bld: 5.4 % (ref 4.6–6.5)

## 2016-03-03 MED ORDER — PREDNISONE 10 MG (21) PO TBPK: ORAL_TABLET | ORAL | 0 refills | Status: DC

## 2016-03-03 NOTE — Progress Notes (Signed)
Subjective:    Patient ID: Sheri Rice, female    DOB: October 23, 1955, 60 y.o.   MRN: [id number]  Chief Complaint  Patient presents with  . Generalized Body Aches    has muscle aches all over, back, legs, and feels weak, when walking it is like she is losing control of her legs, shingles shot     HPI:  Sheri Rice is a 60 y.o. female who  has a past medical history of Allergy and GERD (gastroesophageal reflux disease). and presents today for an office visit.  Weakness / Body aches - This is a new problem. Associated symptoms of myalgias and arthralgias located in her back and legs and feeling like she is losing control of her legs has been going on for about 4 months. Modifying factors include glucosamine and supplements which have not helped very much. Does have back pain that radiates into her legs. She does have some numbness located in her left arm. She is not able to lie on her left side because of the amount of pain she is experiencing.   No Known Allergies    Outpatient Medications Prior to Visit  Medication Sig Dispense Refill  . cyclobenzaprine (FLEXERIL) 10 MG tablet Take 0.5-1 tablets (5-10 mg total) by mouth 3 (three) times daily as needed for muscle spasms. 90 tablet 0  . fesoterodine (TOVIAZ) 4 MG TB24 tablet Take 1 tablet (4 mg total) by mouth daily. 30 tablet 0  . lidocaine (XYLOCAINE) 5 % ointment Apply 1 application topically 3 (three) times daily as needed. 35 g 1  . meloxicam (MOBIC) 15 MG tablet Take 1 tablet (15 mg total) by mouth daily. 30 tablet 1  . ondansetron (ZOFRAN ODT) 4 MG disintegrating tablet Take 1 tablet (4 mg total) by mouth every 8 (eight) hours as needed for nausea. 6 tablet 0  . ranitidine (ZANTAC) 150 MG capsule Take 150 mg by mouth 2 (two) times daily.    Marland Kitchen gabapentin (NEURONTIN) 300 MG capsule Take 1 tablet by mouth daily x 1 day, increase to 2 tablets by mouth daily x 1 day, then increase to 3 tablets by mouth daily. 90 capsule 0  .  oxyCODONE-acetaminophen (PERCOCET/ROXICET) 5-325 MG tablet Take 2 tablets by mouth every 4 (four) hours as needed. 20 tablet 0  . valACYclovir (VALTREX) 1000 MG tablet Take 1 tablet (1,000 mg total) by mouth 3 (three) times daily. 21 tablet 0   No facility-administered medications prior to visit.       Past Surgical History:  Procedure Laterality Date  . DILATION AND CURETTAGE OF UTERUS        Past Medical History:  Diagnosis Date  . Allergy   . GERD (gastroesophageal reflux disease)     Review of Systems  Constitutional: Negative for chills and fever.  Respiratory: Negative for chest tightness, shortness of breath and wheezing.   Cardiovascular: Negative for chest pain, palpitations and leg swelling.  Musculoskeletal: Positive for arthralgias and myalgias. Negative for joint swelling.  Allergic/Immunologic: Negative for immunocompromised state.  Neurological: Positive for weakness. Negative for dizziness, facial asymmetry and numbness.      Objective:    BP 120/88 (BP Location: Left Arm, Patient Position: Sitting, Cuff Size: Normal)   Pulse 90   Temp 97.8 F (36.6 C) (Oral)   Resp 16   Ht 5\' 5"  (1.651 m)   Wt 161 lb (73 kg)   SpO2 97%   BMI 26.79 kg/m  Nursing note and vital signs reviewed.  Physical Exam  Constitutional: She is oriented to person, place, and time. She appears well-developed and well-nourished. No distress.  Cardiovascular: Normal rate, regular rhythm, normal heart sounds and intact distal pulses.   Pulmonary/Chest: Effort normal and breath sounds normal. She has no wheezes. She has no rales. She exhibits no tenderness.  Musculoskeletal:  Upper and lower extremities with no obvious deformity, discoloration or edema. There is mild left sided weakness compared to the right. Range of motion is equal bilaterally. Distal pulses and sensation are intact and appropriate.   Neurological: She is alert and oriented to person, place, and time.  Skin: Skin is  warm and dry.  Psychiatric: She has a normal mood and affect. Her behavior is normal. Judgment and thought content normal.       Assessment & Plan:   Problem List Items Addressed This Visit      Other   Weakness    This is a new problem. Myalgias and arthralgias are concerning for possible autoimmune origin. Obtain ANA, B12/folate, A1c, RF and TSH. Cannot rule out possibility of fibromyalgia or osteoarthritis. Start prednisone. Follow up pending medication trial and blood work results.       Relevant Medications   predniSONE (STERAPRED UNI-PAK 21 TAB) 10 MG (21) TBPK tablet   Other Relevant Orders   Hemoglobin A1c (Completed)   B12 and Folate Panel (Completed)   ANA   Rheumatoid Factor   TSH (Completed)    Other Visit Diagnoses    Encounter for immunization       Relevant Orders   Flu Vaccine QUAD 36+ mos IM (Completed)       I have discontinued Ms. Mantia's ranitidine, meloxicam, cyclobenzaprine, fesoterodine, valACYclovir, ondansetron, lidocaine, oxyCODONE-acetaminophen, and gabapentin. I am also having her start on predniSONE.   Meds ordered this encounter  Medications  . predniSONE (STERAPRED UNI-PAK 21 TAB) 10 MG (21) TBPK tablet    Sig: Take 6 tablets x 1 day, 5 tablets x 1 day, 4 tablets x 1 day, 3 tablets x 1 day, 2 tablets x 1 day, 1 tablet x 1 day    Dispense:  21 tablet    Refill:  0    Order Specific Question:   Supervising Provider    Answer:   Pricilla Holm A [id number]     Follow-up: Return in about 1 month (around 04/03/2016), or if symptoms worsen or fail to improve.  Mauricio Po, FNP

## 2016-03-03 NOTE — Patient Instructions (Signed)
Thank you for choosing [facility].  SUMMARY AND INSTRUCTIONS:  Please check with your insurance company to check the price of the Shingles vaccination. This can be done at your pharmacy or in our office.   Medication:  Your prescription(s) have been submitted to your pharmacy or been printed and provided for you. Please take as directed and contact our office if you believe you are having problem(s) with the medication(s) or have any questions.  Labs:  Please stop by the lab on the lower level of the building for your blood work. Your results will be released to [facility] (or called to you) after review, usually within 72 hours after test completion. If any changes need to be made, you will be notified at that same time.  1.) The lab is open from 7:30am to 5:30 pm Monday-Friday 2.) No appointment is necessary 3.) Fasting (if needed) is 6-8 hours after food and drink; black coffee and water are okay   Follow up:  If your symptoms worsen or fail to improve, please contact our office for further instruction, or in case of emergency go directly to the emergency room at the closest medical facility.     Sndrome de Film/video editor y fibromialgia (Myofascial Pain Syndrome and Fibromyalgia) El sndrome de dolor miofascial y la fibromialgia son trastornos del Social research officer, government. Este dolor puede sentirse, principalmente, en los msculos.  Sndrome de dolor miofascial:  Siempre cursa con puntos neurlgicos o puntos dolorosos a la palpacin en el msculo, que causarn dolor ante la compresin. El dolor puede aparecer y Armed forces operational officer.  Generalmente, afecta el cuello, la parte superior de la espalda y las zonas de los hombros. El dolor suele irradiarse a los brazos y [place].  Fibromialgia:  Cursa con dolores musculares y dolor a la palpacin que aparecen y desaparecen.  Suele asociarse con la fatiga y los trastornos del sueo.  Cursa con puntos neurlgicos.  Suele ser de Engineer, site duracin  (crnica), pero no es potencialmente mortal. La fibromialgia y el dolor miofascial no son lo mismo. Sin embargo, suelen presentarse juntos. Si tiene ambas afecciones, pueden intensificarse entre s. Ambas afecciones son frecuentes y pueden causar bastante dolor y [place]. CAUSAS No se conocen las causas exactas de la fibromialgia y Conservation officer, historic buildings miofascial. Las personas con determinados tipos de genes pueden tener ms probabilidades de Actor fibromialgia. Algunos factores pueden desencadenar ambas afecciones, por ejemplo:  Dolores de columna.  Artritis.  Lesin grave (traumatismo) y otros factores estresantes fsicos.  Estar bajo mucho estrs.  Una enfermedad. SIGNOS Y SNTOMAS Fibromialgia  El sntoma principal de la fibromialgia es el dolor ampliamente distribuido y Conservation officer, historic buildings a la palpacin de los msculos. Esto puede variar con [employer]ant. A veces, el dolor se describe como punzante, fulgurante o urente. Tambin puede tener hormigueo o adormecimiento, problemas para dormir y Programmer, applications. Tal vez se despierte cansado y atontado (disfuncin cognitiva). Otros sntomas pueden ser los siguientes:  Problemas de intestino y vejiga.  Dolores de [place].  Tiene problemas visuales.  Problemas con los aromas y los ruidos.  Depresin o cambios en el estado de nimo.  Menstruaciones dolorosas (dismenorrea).  Sequedad de la piel o los ojos. Sndrome de [employer]  Los sntomas del sndrome de dolor miofascial incluyen lo siguiente:  Bandas musculares tensas y fibrosas.  Sensaciones molestas en las zonas musculares, por ejemplo:  Dolor.  [place].  Quemazn.  Entumecimiento.  Hormigueo.  Debilidad muscular.  Dificultad para mover libremente determinados msculos (  amplitud de movimiento). DIAGNSTICO No hay estudios especficos para diagnosticar la fibromialgia o el sndrome de dolor miofascial. Ambas afecciones pueden ser  difciles de diagnosticar porque tienen sntomas que son frecuentes en muchas otras enfermedades. El mdico puede sospechar la presencia de una o ambas afecciones en funcin de los sntomas y la historia clnica. Tambin Teacher, early years/pre un examen fsico. La clave para diagnosticar la fibromialgia es Patent attorney, fatiga y otros sntomas durante ms de tres meses que otra enfermedad no explica. La clave para diagnosticar el sndrome de dolor miofascial es Pension scheme manager los puntos neurlgicos en los msculos que son dolorosos a la palpacin y Financial risk analyst en cualquier otra parte del cuerpo (dolor referido). TRATAMIENTO El tratamiento para la fibromialgia y el sndrome de dolor miofascial a menudo requiere la participacin de un equipo de mdicos. Generalmente, comienza con el mdico de cabecera y un fisioterapeuta. Es posible que tambin le resulte til trabajar con profesionales alternativos, como masoterapeutas o acupunturistas. El tratamiento para la fibromialgia puede incluir medicamentos, entre ellos, antiinflamatorios no esteroides (AINE) junto con otros frmacos. El tratamiento para el dolor miofascial tambin puede incluir lo siguiente:  Antiinflamatorios no esteroides.  Relajacin y elongacin de los msculos.  Inyecciones en los puntos neurlgicos.  Tratamientos con ondas de sonido (ultrasonido) para [employer]. [place] los medicamentos solamente como se lo haya indicado el mdico.  Haga ejercicio como se lo haya indicado el fisioterapeuta o su mdico.  Tratar de evitar las situaciones estresantes.  Practique tcnicas de relajacin para mantener el estrs bajo control. Tal vez deba intentar lo siguiente:  Biorretroalimentacin.  Formacin de imgenes visuales.  Hipnosis.  Relajacin muscular.  Yoga.  Meditacin.  Hable con el [employer] tratamientos alternativos, por ejemplo, acupuntura o [place].  Lleve un estilo de  vida saludable. Esto incluye consumir una dieta saludable y dormir lo suficiente.  Considere la posibilidad de Chief Financial Officer en un grupo de apoyo.  No realice actividades que le generen tensin o sobrecarga muscular. Eso incluye hacer movimientos repetitivos y levantar objetos pesados. SOLICITE ATENCIN MDICA SI:  Aparecen nuevos sntomas.  Los sntomas empeoran.  Los [employer] causan [employer]nicare.  Tiene dificultad para dormir.  La afeccin le causa depresin o ansiedad. Sheri Rolling MS INFORMACIN  Asociacin Nacional de Fibromialgia (National Fibromyalgia Association): www.fmaware.org  Fundacin contra la Artritis (Arthritis Foundation): www.arthritis.org  Asociacin Estadounidense del Product manager (American Chronic Pain Association): OEMDeals.dk Esta informacin no tiene Marine scientist el consejo del mdico. Asegrese de hacerle al mdico cualquier pregunta que tenga. Document Released: 03/17/2005 Document Revised: 07/09/2015 Document Reviewed: 12/21/2013 Elsevier Interactive Patient Education  2017 [employer].  Osteoartritis (Osteoarthritis) La osteoartritis es una enfermedad que provoca dolor e inflamacin en las articulaciones. Ocurre cuando el cartlago de la articulacin afectada se desgasta. El cartlago acta como una almohadilla que cubre los extremos de los huesos que forman una articulacin. La osteoartritis es la ms frecuente de reumatismo articular. Afecta a menudo a los ancianos. Las articulaciones que se ven ms afectadas por esta afeccin son las que se encuentran en las siguientes zonas:  Los extremos de los dedos.  Los pulgares.  El cuello.  La parte inferior de la espalda.  Las rodillas.  Las caderas CAUSAS Con el paso del [place], el cartlago que recubre los extremos de los huesos comienza a IT sales professional. Esto provoca friccin Monsanto Company, lo que causa dolor y entumecimiento en las articulaciones  afectadas. [place]  [place] probabilidades de padecer osteoartritis, incluidos los siguientes:  Edad avanzada.  Exceso de Engineer, site.  Uso excesivo de la articulacin.  Lesin previa en la articulacin. [place] y entumecimiento en la articulacin.  Con el tiempo, la articulacin pierde su forma normal.  Pueden formarse pequeos depsitos de hueso (ostefitos) en los extremos de Water engineer.  Algunos trozos de [employer] o cartlago pueden separarse y flotar dentro del espacio de la articulacin. Esto puede causar ms dolor y lesiones. DIAGNSTICO El mdico le preguntar acerca de sus sntomas y le har un examen fsico. Le indicarn varios estudios, como:  Radiografas de Counselling psychologist.  Anlisis de sangre para descartar otros tipos de artritis. Pueden usarse pruebas adicionales para diagnosticar la enfermedad. TRATAMIENTO Los [employer] H[employer]away del tratamiento son Financial controller y mejorar el funcionamiento de Water engineer. Los planes de tratamiento pueden incluir lo siguiente:  Un programa de ejercicios recomendado que permita el descanso y el alivio de la articulacin.  Un plan de control del peso.  Tcnicas de [employer], como las siguientes:  Aplicacin correcta de fro y Freight forwarder.  Impulsos elctricos enviados a las terminaciones nerviosas que se encuentran debajo de la piel (neuroestimulacin elctrica transcutnea [TENS]).  Masajes.  Ciertos suplementos nutricionales.  Medicamentos para Financial controller como:  Paracetamol.  Antiinflamatorios no esteroides (AINE), como el naproxeno.  Narcticos o agentes de accin central, como el tramadol.  Corticoides. Estos se pueden administrar por va oral o mediante una inyeccin.  [place] para reposicionar los Aff[employer] Computer Services y Best boy (osteotoma) o para retirar las piezas sueltas de hueso y Database administrator. Puede ser necesario el reemplazo  de las articulaciones en estadios avanzados de la enfermedad. [place] los medicamentos solamente como se lo haya indicado el mdico.  Mantenga un peso saludable. Siga las instrucciones del mdico con respecto al control del [place]. Esto puede incluir instrucciones Recruitment consultant.  Practique los ejercicios que le indiquen. Es posible que el mdico le recomiende tipos especficos de ejercicios. Estos pueden incluir los siguientes:  Ejercicios de fortalecimiento Se realizan para fortalecer los msculos que sostienen las articulaciones afectadas por la artritis. Pueden realizarse con peso o con bandas para agregar resistencia.  Actividades Precious Haws. Son Clinical research associate a paso ligero, gimnasia [place] de bajo impacto, que acelere el corazn.  Actividades de amplitud de movimientos. Dan agilidad a las articulaciones.  Ejercicios de equilibrio y [place]. Ayudan a [employer] Micro Devices se necesitan para la vida diaria.  Haga descansar a las articulaciones segn las indicaciones del mdico.  Concurra a todas las visitas de control como se lo haya indicado el mdico. SOLICITE ATENCIN MDICA SI:  La piel se pone roja.  Aparece una erupcin adems del dolor en la articulacin.  El dolor en la articulacin empeora.  Tiene fiebre y siente dolor en la articulacin o el msculo. SOLICITE ATENCIN MDICA DE INMEDIATO SI:  Nota una prdida importante de peso o del apetito.  Tiene transpiracin nocturna. [place] de Artritis y Arboriculturist Musculoesquelticas y Dermatolgicas [facility] of Arthritis and Musculoskeletal and Skin Diseases): www.niams.SouthExposed.es.  [place] ([employer] on Aging): http://kim-miller.com/.  Instituto Norteamericano de Research officer, political party of Rheumatology): www.rheumatology.org. Esta informacin no tiene como fin reemplazar  el consejo del mdico. Asegrese de hacerle al mdico cualquier pregunta que tenga. Document Released: 12/25/2004 Document Revised: 04/07/2014 Document Reviewed: 11/22/2012 Elsevier  Interactive Patient Education  2017 [employer].

## 2016-03-03 NOTE — Assessment & Plan Note (Signed)
This is a new problem. Myalgias and arthralgias are concerning for possible autoimmune origin. Obtain ANA, B12/folate, A1c, RF and TSH. Cannot rule out possibility of fibromyalgia or osteoarthritis. Start prednisone. Follow up pending medication trial and blood work results.

## 2016-03-04 ENCOUNTER — Encounter: Payer: Self-pay | Admitting: Family

## 2016-03-04 LAB — RHEUMATOID FACTOR

## 2016-03-04 LAB — ANA: Anti Nuclear Antibody(ANA): NEGATIVE

## 2016-03-04 MED ORDER — AMITRIPTYLINE HCL 10 MG PO TABS: 10.0000 mg | ORAL_TABLET | Freq: Every day | ORAL | 0 refills | Status: DC

## 2017-01-20 ENCOUNTER — Encounter: Payer: Self-pay | Admitting: Family Medicine

## 2017-01-20 ENCOUNTER — Ambulatory Visit (INDEPENDENT_AMBULATORY_CARE_PROVIDER_SITE_OTHER): Payer: 59 | Admitting: Family Medicine

## 2017-01-20 ENCOUNTER — Ambulatory Visit: Payer: Commercial Managed Care - HMO | Admitting: Nurse Practitioner

## 2017-01-20 VITALS — BP 118/74 | HR 72 | Temp 98.0°F | Ht 65.0 in | Wt 165.0 lb

## 2017-01-20 DIAGNOSIS — R05 Cough: Secondary | ICD-10-CM

## 2017-01-20 DIAGNOSIS — R059 Cough, unspecified: Secondary | ICD-10-CM

## 2017-01-20 MED ORDER — HYDROCODONE-HOMATROPINE 5-1.5 MG/5ML PO SYRP: 5.0000 mL | ORAL_SOLUTION | Freq: Three times a day (TID) | ORAL | 0 refills | Status: DC | PRN

## 2017-01-20 NOTE — Progress Notes (Signed)
  Sheri Rice - 61 y.o. female MRN [id number]  Date of birth: 03-21-1956  SUBJECTIVE:  Including CC & ROS.  Chief Complaint  Patient presents with  . Cough    present for one week. She took some robitussin and tylenol which did not help.    [name] is a 61 year old female is presenting with cough. The cough has been ongoing for 3 weeks or so. Has tried over-the-counter medications. Does not have any production with the cough. Has not had any fevers. Denies any tooth pain. Does have associated headaches with it.   Review of her chest x-ray from 04/29/10 shows normal chest x-ray.  CT of her sinus from 03/20/2010 shows changes of chronic sinusitis and acute disease process.  Review of Systems  Constitutional: Negative for fever.  Respiratory: Positive for cough.   Neurological: Positive for headaches.    HISTORY: Past Medical, Surgical, Social, and Family History Reviewed & Updated per EMR.   Pertinent Historical Findings include:  Past Medical History:  Diagnosis Date  . Allergy   . GERD (gastroesophageal reflux disease)     Past Surgical History:  Procedure Laterality Date  . DILATION AND CURETTAGE OF UTERUS      No Known Allergies  Family History  Problem Relation Age of Onset  . Diabetes Mother   . Stroke Maternal Grandmother      Social History   Social History  . Marital status: Married    Spouse name: N/A  . Number of children: 3  . Years of education: 97   Occupational History  . Maintenace     Social History Main Topics  . Smoking status: Never Smoker  . Smokeless tobacco: Never Used  . Alcohol use No  . Drug use: No  . Sexual activity: Not on file   Other Topics Concern  . Not on file   Social History Narrative  . No narrative on file     PHYSICAL EXAM:  VS: BP 118/74 (BP Location: Left Arm, Patient Position: Sitting, Cuff Size: Normal)   Pulse 72   Temp 98 F (36.7 C) (Oral)   Ht 5\' 5"  (1.651 m)   Wt 165 lb (74.8 kg)   SpO2 99%    BMI 27.46 kg/m  Physical Exam Gen: NAD, alert, cooperative with exam,  ENT: normal lips, normal nasal mucosa, normal tympanic membranes bilaterally, normal oropharynx, no cervical lymphadenopathy, no tonsillar exudates  Eye: normal EOM, normal conjunctiva and lids CV:  no edema, +2 pedal pulses, S1-S2 regular rate and rhythm   Resp: no accessory muscle use, non-labored, clear to auscultation bilaterally, no crackles or wheezes Skin: no rashes, no areas of induration  Neuro: normal tone, normal sensation to touch Psych:  normal insight, alert and oriented MSK: Normal gait, normal strength      ASSESSMENT & PLAN:   COUGH Cough seems to be associated with a postviral course. No suggestion of bacterial infection on exam today. - Hycodan for cough. - Counseled and advised supportive care. - If no improvement consider a PPI for potential reflux. May need to consider an x-ray if no improvement.

## 2017-01-20 NOTE — Assessment & Plan Note (Signed)
Cough seems to be associated with a postviral course. No suggestion of bacterial infection on exam today. - Hycodan for cough. - Counseled and advised supportive care. - If no improvement consider a PPI for potential reflux. May need to consider an x-ray if no improvement.

## 2017-01-20 NOTE — Patient Instructions (Signed)
Thank you for coming in,   Please try things such as zyrtec-D or allegra-D which is an antihistamine and decongestant.   Please try afrin which will help with nasal congestion but use for only three days.   Please also try using a netti pot on a regular occasion.  Honey can help with a sore throat.      Please feel free to call with any questions or concerns at any time, at [phone]. --Dr. Lakeia Bradshaw  

## 2017-02-23 ENCOUNTER — Ambulatory Visit: Payer: 59 | Admitting: Nurse Practitioner

## 2017-02-23 ENCOUNTER — Encounter: Payer: Self-pay | Admitting: Nurse Practitioner

## 2017-02-23 VITALS — BP 120/78 | HR 86 | Temp 97.7°F | Ht 65.0 in | Wt 161.0 lb

## 2017-02-23 DIAGNOSIS — R05 Cough: Secondary | ICD-10-CM | POA: Diagnosis not present

## 2017-02-23 DIAGNOSIS — K219 Gastro-esophageal reflux disease without esophagitis: Secondary | ICD-10-CM | POA: Diagnosis not present

## 2017-02-23 DIAGNOSIS — J309 Allergic rhinitis, unspecified: Secondary | ICD-10-CM | POA: Diagnosis not present

## 2017-02-23 DIAGNOSIS — R059 Cough, unspecified: Secondary | ICD-10-CM

## 2017-02-23 MED ORDER — MOMETASONE FUROATE 50 MCG/ACT NA SUSP: 2.0000 | Freq: Every day | NASAL | 2 refills | Status: DC

## 2017-02-23 MED ORDER — CETIRIZINE HCL 10 MG PO TABS: 10.0000 mg | ORAL_TABLET | Freq: Every day | ORAL | 11 refills | Status: DC

## 2017-02-23 MED ORDER — RANITIDINE HCL 300 MG PO TABS: 300.0000 mg | ORAL_TABLET | Freq: Every day | ORAL | 0 refills | Status: DC

## 2017-02-23 MED ORDER — OMEPRAZOLE 20 MG PO CPDR: 20.0000 mg | DELAYED_RELEASE_CAPSULE | Freq: Every day | ORAL | 0 refills | Status: DC

## 2017-02-23 NOTE — Patient Instructions (Addendum)
Consider CXR if persistent cough and SOB.  Heartburn Heartburn is a type of pain or discomfort that can happen in the throat or chest. It is often described as a burning pain. It may also cause a bad taste in the mouth. Heartburn may feel worse when you lie down or bend over. It may be caused by stomach contents that move back up (reflux) into the tube that connects the mouth with the stomach (esophagus). Follow these instructions at home: Take these actions to lessen your discomfort and to help avoid problems. Diet  Follow a diet as told by your doctor. You may need to avoid foods and drinks such as: ? Coffee and tea (with or without caffeine). ? Drinks that contain alcohol. ? Energy drinks and sports drinks. ? Carbonated drinks or sodas. ? Chocolate and cocoa. ? Peppermint and mint flavorings. ? Garlic and onions. ? Horseradish. ? Spicy and acidic foods, such as peppers, chili powder, curry powder, vinegar, hot sauces, and BBQ sauce. ? Citrus fruit juices and citrus fruits, such as oranges, lemons, and limes. ? Tomato-based foods, such as red sauce, chili, salsa, and pizza with red sauce. ? Fried and fatty foods, such as donuts, french fries, potato chips, and high-fat dressings. ? High-fat meats, such as hot dogs, rib eye steak, sausage, ham, and bacon. ? High-fat dairy items, such as whole milk, butter, and cream cheese.  Eat small meals often. Avoid eating large meals.  Avoid drinking large amounts of liquid with your meals.  Avoid eating meals during the 2-3 hours before bedtime.  Avoid lying down right after you eat.  Do not exercise right after you eat. General instructions  Pay attention to any changes in your symptoms.  Take over-the-counter and prescription medicines only as told by your doctor. Do not take aspirin, ibuprofen, or other NSAIDs unless your doctor says it is okay.  Do not use any tobacco products, including cigarettes, chewing tobacco, and e-cigarettes.  If you need help quitting, ask your doctor.  Wear loose clothes. Do not wear anything tight around your waist.  Raise (elevate) the head of your bed about 6 inches (15 cm).  Try to lower your stress. If you need help doing this, ask your doctor.  If you are overweight, lose an amount of weight that is healthy for you. Ask your doctor about a safe weight loss goal.  Keep all follow-up visits as told by your doctor. This is important. Contact a doctor if:  You have new symptoms.  You lose weight and you do not know why it is happening.  You have trouble swallowing, or it hurts to swallow.  You have wheezing or a cough that keeps happening.  Your symptoms do not get better with treatment.  You have heartburn often for more than two weeks. Get help right away if:  You have pain in your arms, neck, jaw, teeth, or back.  You feel sweaty, dizzy, or light-headed.  You have chest pain or shortness of breath.  You throw up (vomit) and your throw up looks like blood or coffee grounds.  Your poop (stool) is bloody or black. This information is not intended to replace advice given to you by your health care provider. Make sure you discuss any questions you have with your health care provider. Document Released: 11/27/2010 Document Revised: 08/23/2015 Document Reviewed: 07/12/2014 Elsevier Interactive Patient Education  [employer].

## 2017-02-23 NOTE — Assessment & Plan Note (Signed)
Consider H.pylori testing if no improvement with omeprazole and ranitidine.

## 2017-02-23 NOTE — Progress Notes (Signed)
Subjective:  Patient ID: Sheri Rice, female    DOB: [birth year]  Age: 61 y.o. MRN: [id number]  CC: Establish Care (transfer from [facility]. felt like something inside throat:cant breath full and cant swallow 100%. CPE?) and Cough (bad coughing when it occure---not better from 10/23)   Cough  This is a recurrent problem. The current episode started more than 1 month ago. The problem has been waxing and waning. The cough is productive of sputum. Associated symptoms include heartburn, nasal congestion, postnasal drip, a sore throat and shortness of breath. Pertinent negatives include no chest pain, chills, ear congestion, ear pain, fever, headaches, hemoptysis, rash, rhinorrhea, sweats, weight loss or wheezing. The symptoms are aggravated by lying down and exercise. She has tried OTC cough suppressant and prescription cough suppressant for the symptoms. The treatment provided mild relief. Her past medical history is significant for environmental allergies. There is no history of asthma or bronchitis.  Gastroesophageal Reflux  She complains of belching, choking, coughing, globus sensation, heartburn, a hoarse voice, a sore throat and water brash. She reports no chest pain, no dysphagia, no nausea, no stridor or no wheezing. This is a chronic problem. The current episode started more than 1 year ago. The problem occurs frequently. The problem has been waxing and waning. The heartburn is located in the substernum. The heartburn is of moderate intensity. The heartburn does not wake her from sleep. The heartburn does not limit her activity. The heartburn doesn't change with position. The symptoms are aggravated by stress and lying down. Pertinent negatives include no anemia, melena, muscle weakness, orthopnea or weight loss. Risk factors include obesity, lack of exercise and NSAIDs. She has tried a PPI, a histamine-2 antagonist and an antacid for the symptoms. The treatment provided mild relief.   Outpatient  Medications Prior to Visit  Medication Sig Dispense Refill  . cholecalciferol (VITAMIN D) 1000 units tablet Take 1 tablet (1,000 Units total) by mouth daily.    . ferrous sulfate 325 (65 FE) MG tablet Take 1 tablet (325 mg total) by mouth daily with breakfast.  3  . Gluc-Chonn-MSM-Boswellia-Vit D (GLUCOSAMINE CHONDROITIN COMPLX) TABS Take 1 tablet by mouth 2 (two) times daily.    Marland Kitchen HYDROcodone-homatropine (HYCODAN) 5-1.5 MG/5ML syrup Take 5 mLs by mouth every 8 (eight) hours as needed for cough. (Patient not taking: Reported on 02/23/2017) 60 mL 0  . Multiple Vitamins-Minerals (CENTRUM ADULTS) TABS Take 1 tablet by mouth.     No facility-administered medications prior to visit.     ROS See HPI  Objective:  BP 120/78   Pulse 86   Temp 97.7 F (36.5 C)   Ht 5\' 5"  (1.651 m)   Wt 161 lb (73 kg)   SpO2 96%   BMI 26.79 kg/m   BP Readings from Last 3 Encounters:  02/23/17 120/78  01/20/17 118/74  03/03/16 120/88    Wt Readings from Last 3 Encounters:  02/23/17 161 lb (73 kg)  01/20/17 165 lb (74.8 kg)  03/03/16 161 lb (73 kg)    Physical Exam  Constitutional: She is oriented to person, place, and time. No distress.  HENT:  Mouth/Throat: Uvula is midline. No trismus in the jaw. No oropharyngeal exudate or posterior oropharyngeal erythema.  Neck: Normal range of motion. Neck supple. No thyromegaly present.  Cardiovascular: Normal rate and regular rhythm.  Pulmonary/Chest: Effort normal and breath sounds normal.  Abdominal: Soft. Bowel sounds are normal. She exhibits no distension. There is tenderness.  Epigastric tenderness  Musculoskeletal: She exhibits  no edema.  Lymphadenopathy:    She has no cervical adenopathy.  Neurological: She is alert and oriented to person, place, and time.  Skin: Skin is warm and dry.  Psychiatric: She has a normal mood and affect.  Vitals reviewed.   Lab Results  Component Value Date   WBC 6.2 03/18/2010   HGB 12.0 03/18/2010   HCT 35.4  (L) 03/18/2010   PLT 294.0 03/18/2010   GLUCOSE 84 06/11/2007   CHOL 205 (H) 06/11/2007   TRIG 313 (H) 06/11/2007   HDL 48 06/11/2007   LDLCALC 94 06/11/2007   ALT 11 06/11/2007   AST 16 06/11/2007   NA 138 06/11/2007   K 4.4 06/11/2007   CL 103 06/11/2007   CREATININE 0.57 06/11/2007   BUN 9 06/11/2007   CO2 21 06/11/2007   TSH 2.39 03/03/2016   HGBA1C 5.4 03/03/2016    No results found.  Assessment & Plan:   Sheri Rice was seen today for establish care and cough.  Diagnoses and all orders for this visit:  Gastroesophageal reflux disease, esophagitis presence not specified -     omeprazole (PRILOSEC) 20 MG capsule; Take 1 capsule (20 mg total) by mouth daily before breakfast. -     ranitidine (ZANTAC) 300 MG tablet; Take 1 tablet (300 mg total) by mouth at bedtime.  Cough -     mometasone (NASONEX) 50 MCG/ACT nasal spray; Place 2 sprays into the nose daily. -     omeprazole (PRILOSEC) 20 MG capsule; Take 1 capsule (20 mg total) by mouth daily before breakfast. -     ranitidine (ZANTAC) 300 MG tablet; Take 1 tablet (300 mg total) by mouth at bedtime. -     cetirizine (ZYRTEC) 10 MG tablet; Take 1 tablet (10 mg total) by mouth at bedtime.  Allergic rhinitis, unspecified seasonality, unspecified trigger -     mometasone (NASONEX) 50 MCG/ACT nasal spray; Place 2 sprays into the nose daily. -     cetirizine (ZYRTEC) 10 MG tablet; Take 1 tablet (10 mg total) by mouth at bedtime.   I am having Sheri Rice start on mometasone, omeprazole, ranitidine, and cetirizine. I am also having her maintain her HYDROcodone-homatropine, ferrous sulfate, CENTRUM ADULTS, cholecalciferol, and GLUCOSAMINE CHONDROITIN COMPLX.  Meds ordered this encounter  Medications  . mometasone (NASONEX) 50 MCG/ACT nasal spray    Sig: Place 2 sprays into the nose daily.    Dispense:  17 g    Refill:  2    Order Specific Question:   Supervising Provider    Answer:   Lucille Passy [[id number]]  . omeprazole  (PRILOSEC) 20 MG capsule    Sig: Take 1 capsule (20 mg total) by mouth daily before breakfast.    Dispense:  30 capsule    Refill:  0    Order Specific Question:   Supervising Provider    Answer:   Lucille Passy [[id number]]  . ranitidine (ZANTAC) 300 MG tablet    Sig: Take 1 tablet (300 mg total) by mouth at bedtime.    Dispense:  30 tablet    Refill:  0    Order Specific Question:   Supervising Provider    Answer:   Lucille Passy [[id number]]  . cetirizine (ZYRTEC) 10 MG tablet    Sig: Take 1 tablet (10 mg total) by mouth at bedtime.    Dispense:  30 tablet    Refill:  11    Order Specific Question:   Supervising Provider  Answer:   Lucille Passy [[id number]]    Follow-up: Return in about 2 weeks (around 03/09/2017) for CPE (fasting) and re eval of cough.  Wilfred Lacy, NP

## 2017-02-25 ENCOUNTER — Other Ambulatory Visit: Payer: Self-pay | Admitting: Nurse Practitioner

## 2017-02-25 ENCOUNTER — Other Ambulatory Visit: Payer: Self-pay

## 2017-02-25 MED ORDER — FLUTICASONE PROPIONATE 50 MCG/ACT NA SUSP: 2.0000 | Freq: Every day | NASAL | 2 refills | Status: DC

## 2017-02-25 NOTE — Progress Notes (Signed)
Change in therapy. New rx sent.

## 2017-03-10 ENCOUNTER — Ambulatory Visit: Payer: 59 | Admitting: Nurse Practitioner

## 2017-03-25 ENCOUNTER — Encounter: Payer: 59 | Admitting: Nurse Practitioner

## 2017-04-01 ENCOUNTER — Encounter: Payer: Self-pay | Admitting: Nurse Practitioner

## 2017-04-01 ENCOUNTER — Encounter: Payer: Self-pay | Admitting: Internal Medicine

## 2017-04-01 ENCOUNTER — Ambulatory Visit (INDEPENDENT_AMBULATORY_CARE_PROVIDER_SITE_OTHER): Payer: 59 | Admitting: Nurse Practitioner

## 2017-04-01 ENCOUNTER — Other Ambulatory Visit (HOSPITAL_COMMUNITY)
Admission: RE | Admit: 2017-04-01 | Discharge: 2017-04-01 | Disposition: A | Discharge status: Home / Self Care | Payer: 59 | Source: Ambulatory Visit | Attending: Nurse Practitioner | Admitting: Nurse Practitioner

## 2017-04-01 VITALS — BP 114/74 | HR 59 | Temp 97.6°F | Ht 65.0 in | Wt 163.0 lb

## 2017-04-01 DIAGNOSIS — K219 Gastro-esophageal reflux disease without esophagitis: Secondary | ICD-10-CM | POA: Diagnosis not present

## 2017-04-01 DIAGNOSIS — N6320 Unspecified lump in the left breast, unspecified quadrant: Secondary | ICD-10-CM | POA: Diagnosis not present

## 2017-04-01 DIAGNOSIS — F458 Other somatoform disorders: Secondary | ICD-10-CM | POA: Diagnosis not present

## 2017-04-01 DIAGNOSIS — E781 Pure hyperglyceridemia: Secondary | ICD-10-CM | POA: Diagnosis not present

## 2017-04-01 DIAGNOSIS — Z1231 Encounter for screening mammogram for malignant neoplasm of breast: Secondary | ICD-10-CM

## 2017-04-01 DIAGNOSIS — Z124 Encounter for screening for malignant neoplasm of cervix: Secondary | ICD-10-CM

## 2017-04-01 DIAGNOSIS — Z0001 Encounter for general adult medical examination with abnormal findings: Secondary | ICD-10-CM | POA: Diagnosis not present

## 2017-04-01 DIAGNOSIS — Z23 Encounter for immunization: Secondary | ICD-10-CM | POA: Diagnosis not present

## 2017-04-01 DIAGNOSIS — Z0000 Encounter for general adult medical examination without abnormal findings: Secondary | ICD-10-CM | POA: Insufficient documentation

## 2017-04-01 DIAGNOSIS — R350 Frequency of micturition: Secondary | ICD-10-CM | POA: Diagnosis not present

## 2017-04-01 DIAGNOSIS — Z01419 Encounter for gynecological examination (general) (routine) without abnormal findings: Secondary | ICD-10-CM | POA: Insufficient documentation

## 2017-04-01 DIAGNOSIS — Z1239 Encounter for other screening for malignant neoplasm of breast: Secondary | ICD-10-CM

## 2017-04-01 DIAGNOSIS — Z114 Encounter for screening for human immunodeficiency virus [HIV]: Secondary | ICD-10-CM | POA: Diagnosis not present

## 2017-04-01 DIAGNOSIS — R3915 Urgency of urination: Secondary | ICD-10-CM

## 2017-04-01 LAB — CBC WITH DIFFERENTIAL/PLATELET
BASOS ABS: 0 10*3/uL (ref 0.0–0.1)
BASOS PCT: 0.2 % (ref 0.0–3.0)
EOS PCT: 0.6 % (ref 0.0–5.0)
Eosinophils Absolute: 0.1 10*3/uL (ref 0.0–0.7)
HEMATOCRIT: 37.7 % (ref 36.0–46.0)
Hemoglobin: 12.8 g/dL (ref 12.0–15.0)
LYMPHS ABS: 1.8 10*3/uL (ref 0.7–4.0)
LYMPHS PCT: 18.4 % (ref 12.0–46.0)
MCHC: 33.8 g/dL (ref 30.0–36.0)
MCV: 90.9 fl (ref 78.0–100.0)
MONOS PCT: 6.5 % (ref 3.0–12.0)
Monocytes Absolute: 0.6 10*3/uL (ref 0.1–1.0)
NEUTROS ABS: 7.3 10*3/uL (ref 1.4–7.7)
NEUTROS PCT: 74.3 % (ref 43.0–77.0)
PLATELETS: 269 10*3/uL (ref 150.0–400.0)
RBC: 4.15 Mil/uL (ref 3.87–5.11)
RDW: 12.2 % (ref 11.5–15.5)
WBC: 9.9 10*3/uL (ref 4.0–10.5)

## 2017-04-01 LAB — COMPREHENSIVE METABOLIC PANEL
ALT: 19 U/L (ref 0–35)
AST: 20 U/L (ref 0–37)
Albumin: 4.3 g/dL (ref 3.5–5.2)
Alkaline Phosphatase: 96 U/L (ref 39–117)
BILIRUBIN TOTAL: 0.6 mg/dL (ref 0.2–1.2)
BUN: 16 mg/dL (ref 6–23)
CALCIUM: 9.1 mg/dL (ref 8.4–10.5)
CHLORIDE: 102 meq/L (ref 96–112)
CO2: 28 mEq/L (ref 19–32)
CREATININE: 0.73 mg/dL (ref 0.40–1.20)
GFR: 86.01 mL/min (ref >60.00)
Glucose, Bld: 79 mg/dL (ref 70–99)
Potassium: 3.5 mEq/L (ref 3.5–5.1)
SODIUM: 139 meq/L (ref 135–145)
TOTAL PROTEIN: 7.4 g/dL (ref 6.0–8.3)

## 2017-04-01 LAB — LIPID PANEL
CHOL/HDL RATIO: 5
CHOLESTEROL: 221 mg/dL — AB (ref 0–200)
HDL: 43.4 mg/dL (ref >39.00)
NonHDL: 177.47
TRIGLYCERIDES: 338 mg/dL — AB (ref 0.0–149.0)
VLDL: 67.6 mg/dL — ABNORMAL HIGH (ref 0.0–40.0)

## 2017-04-01 LAB — TSH: TSH: 2.97 u[IU]/mL (ref 0.35–4.50)

## 2017-04-01 LAB — LDL CHOLESTEROL, DIRECT: LDL DIRECT: 112 mg/dL

## 2017-04-01 MED ORDER — OMEPRAZOLE 20 MG PO CPDR: 20.0000 mg | DELAYED_RELEASE_CAPSULE | Freq: Every day | ORAL | 1 refills | Status: DC

## 2017-04-01 NOTE — Progress Notes (Signed)
Subjective:    Patient ID: Sheri Rice, female    DOB: 1955-11-14, 62 y.o.   MRN: [id number]  Patient presents today for complete physical   HPI  Complains of persistent cough, SOB and throat fullness. Worse at night. Completed use of omeprazole and ranitidine.  Urinary urgency and frequency: Chronic, No dysuria, no ABD pain, no vaginal discharge.  Immunizations: (TDAP, Hep C screen, Pneumovax, Influenza, zoster)  Health Maintenance  Topic Date Due  . Colon Cancer Screening  10/08/2005  . Mammogram  04/05/2015  . Flu Shot  07/19/2017*  . Pap Smear  04/01/2020  . Tetanus Vaccine  04/02/2027  .  Hepatitis C: One time screening is recommended by Center for Disease Control  (CDC) for  adults born from [birth year] through 1965.   Completed  . HIV Screening  Completed  *Topic was postponed. The date shown is not the original due date.   Diet:regular.  Weight:  Wt Readings from Last 3 Encounters:  04/03/17 161 lb (73 kg)  04/01/17 163 lb (73.9 kg)  02/23/17 161 lb (73 kg)   Exercise:none.  Fall Risk: Fall Risk  04/01/2017  Falls in the past year? No   Home Safety:home with husband.  Depression/Suicide: Depression screen [facility] 2/9 04/01/2017  Decreased Interest 0  Down, Depressed, Hopeless 0  PHQ - 2 Score 0   No flowsheet data found. Colonoscopy (every 5-64yrs, >50-20yrs):needed.  Pap Smear (every 39yrs for >21-29 without HPV, every 69yrs for >30-58yrs with HPV):needed.  Mammogram (yearly, >2yrs):needed.  Vision:up to date.  Dental: up to date.  Medications and allergies reviewed with patient and updated if appropriate.  Patient Active Problem List   Diagnosis Date Noted  . Globus hystericus 04/06/2017  . Hypertriglyceridemia 04/06/2017  . Gastroesophageal reflux disease 02/23/2017  . Weakness 03/03/2016  . Herpes zoster 09/25/2015  . Urinary urgency 04/18/2015  . DDD (degenerative disc disease), cervical 03/16/2015  . Osteoarthritis of both knees 03/16/2015    . COUGH 03/18/2010  . HYPERLIPIDEMIA, MIXED, MILD 05/28/2007  . ANAL OR RECTAL PAIN 05/28/2007  . URINARY INCONTINENCE, MIXED 02/05/2007  . RESTLESS LEG SYNDROME 12/08/2006  . CHEST PAIN, ATYPICAL 12/08/2006  . SYMPTOM, PAIN, ABDOMINAL, GENERALIZED 12/08/2006  . DYSMENORRHEA 12/05/2006  . HEADACHE 12/05/2006  . Allergic rhinitis 12/04/2006  . PLANTAR FASCIITIS, BILATERAL 10/05/2006  . HOARSENESS 07/28/2006  . FIBROIDS, UTERUS 03/31/2001    Current Outpatient Medications on File Prior to Visit  Medication Sig Dispense Refill  . cholecalciferol (VITAMIN D) 1000 units tablet Take 1 tablet (1,000 Units total) by mouth daily.    . ferrous sulfate 325 (65 FE) MG tablet Take 1 tablet (325 mg total) by mouth daily with breakfast.  3  . Gluc-Chonn-MSM-Boswellia-Vit D (GLUCOSAMINE CHONDROITIN COMPLX) TABS Take 1 tablet by mouth 2 (two) times daily.    . Multiple Vitamins-Minerals (CENTRUM ADULTS) TABS Take 1 tablet by mouth.     No current facility-administered medications on file prior to visit.     Past Medical History:  Diagnosis Date  . Allergy   . GERD (gastroesophageal reflux disease)     Past Surgical History:  Procedure Laterality Date  . DILATION AND CURETTAGE OF UTERUS      Social History   Socioeconomic History  . Marital status: Married    Spouse name: None  . Number of children: 3  . Years of education: 106  . Highest education level: None  Social Needs  . Financial resource strain: None  . Food insecurity - worry:  None  . Food insecurity - inability: None  . Transportation needs - medical: None  . Transportation needs - non-medical: None  Occupational History  . 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   Tobacco Use  . Smoking status: Never Smoker  . Smokeless tobacco: Never Used  Substance and Sexual Activity  . Alcohol use: No    Alcohol/week: 0.0 oz  . Drug use: No  . Sexual activity: Yes    Birth control/protection: Post-menopausal  Other Topics Concern  . None   Social History Narrative  . None    Family History  Problem Relation Age of Onset  . Diabetes Mother   . Depression Mother   . Stroke Maternal Grandmother         Review of Systems  Constitutional: Negative for fever, malaise/fatigue and weight loss.  HENT: Negative for congestion and sore throat.   Eyes:       Negative for visual changes  Respiratory: Positive for cough and shortness of breath.   Cardiovascular: Negative for chest pain, palpitations and leg swelling.  Gastrointestinal: Positive for heartburn. Negative for abdominal pain, blood in stool, constipation, diarrhea, melena and nausea.  Genitourinary: Positive for frequency and urgency. Negative for dysuria.  Musculoskeletal: Negative for falls, joint pain and myalgias.  Skin: Negative for rash.  Neurological: Negative for dizziness, sensory change and headaches.  Endo/Heme/Allergies: Does not bruise/bleed easily.  Psychiatric/Behavioral: Negative for depression, substance abuse and suicidal ideas. The patient is not nervous/anxious and does not have insomnia.     Objective:   Vitals:   04/01/17 1313  BP: 114/74  Pulse: (!) 59  Temp: 97.6 F (36.4 C)  SpO2: 99%    Body mass index is 27.12 kg/m.   Physical Examination:  Physical Exam  Constitutional: She is oriented to person, place, and time and well-developed, well-nourished, and in no distress. No distress.  HENT:  Right Ear: External ear normal.  Left Ear: External ear normal.  Nose: Nose normal.  Mouth/Throat: No oropharyngeal exudate.  Eyes: Conjunctivae and EOM are normal. Pupils are equal, round, and reactive to light. No scleral icterus.  Neck: Normal range of motion. Neck supple. No thyromegaly present.  Cardiovascular: Normal rate, regular rhythm, normal heart sounds and intact distal pulses.  Pulmonary/Chest: Effort normal and breath sounds normal. Right breast exhibits no mass, no nipple discharge and no skin change. Left breast  exhibits no mass, no nipple discharge and no skin change. Breasts are symmetrical.  Abdominal: Soft. Bowel sounds are normal. She exhibits no distension. There is no tenderness.  Genitourinary: Rectum normal, vagina normal, cervix normal, right adnexa normal, left adnexa normal and vulva normal. Cervix exhibits no motion tenderness. No vaginal discharge found.  Musculoskeletal: Normal range of motion. She exhibits no edema or tenderness.  Lymphadenopathy:    She has no cervical adenopathy.  Neurological: She is alert and oriented to person, place, and time. Gait normal.  Skin: Skin is warm and dry.  Psychiatric: Affect and judgment normal.  Vitals reviewed.   ASSESSMENT and PLAN:  Sharra was seen today for annual exam.  Diagnoses and all orders for this visit:  Encounter for preventative adult health care exam with abnormal findings -     Urinalysis w microscopic + reflex cultur -     TSH -     Comprehensive metabolic panel -     Lipid panel -     CBC with Differential/Platelet -     HIV antibody -     Hepatitis C Antibody -  Ambulatory referral to Gastroenterology -     MM DIGITAL SCREENING BILATERAL -     Cytology - PAP  Urinary frequency -     Urinalysis w microscopic + reflex cultur -     REFLEXIVE URINE CULTURE  Hypertriglyceridemia -     Lipid panel -     LDL cholesterol, direct -     fenofibrate (TRICOR) 145 MG tablet; Take 1 tablet (145 mg total) by mouth daily.  Encounter for screening for HIV -     HIV antibody  Encounter for Papanicolaou smear for cervical cancer screening -     Cytology - PAP  Gastroesophageal reflux disease, esophagitis presence not specified -     Ambulatory referral to Gastroenterology -     H Pylori, IGM, IGG, IGA AB -     omeprazole (PRILOSEC) 20 MG capsule; Take 1 capsule (20 mg total) by mouth daily before breakfast.  Globus hystericus -     Ambulatory referral to Gastroenterology -     H Pylori, IGM, IGG, IGA  AB  Screening for breast cancer -     MM DIGITAL SCREENING BILATERAL  Need for diphtheria-tetanus-pertussis (Tdap) vaccine -     Tdap vaccine greater than or equal to 7yo IM  Urinary urgency -     Urinalysis w microscopic + reflex cultur -     REFLEXIVE URINE CULTURE   HYPERLIPIDEMIA, MIXED, MILD Start fenofibrate  Urinary urgency Normal urinalysis  Gastroesophageal reflux disease Continue omeprazole. Entered GI referral     Follow up: Return in about 3 months (around 06/30/2017) for GERD and cough.  Wilfred Lacy, NP

## 2017-04-01 NOTE — Patient Instructions (Addendum)
Fenofibrate sent due to elevated triglyceride. Start heart healthy diet and regular exercise as well. H. Pylori test is inconclusive for acute or previous infection. F/up with GI as discussed. Start omeprazole as prescribed. Negative HIV and PAP smear. Normal urinalysis, cbc, cmp, and TSH.  You will be called to schedule appointment with gastroenterology, and for mammogram.  Continue current medications.  Health Maintenance, Female Adopting a healthy lifestyle and getting preventive care can go a long way to promote health and wellness. Talk with your health care provider about what schedule of regular examinations is right for you. This is a good chance for you to check in with your provider about disease prevention and staying healthy. In between checkups, there are plenty of things you can do on your own. Experts have done a lot of research about which lifestyle changes and preventive measures are most likely to keep you healthy. Ask your health care provider for more information. Weight and diet Eat a healthy diet  Be sure to include plenty of vegetables, fruits, low-fat dairy products, and lean protein.  Do not eat a lot of foods high in solid fats, added sugars, or salt.  Get regular exercise. This is one of the most important things you can do for your health. ? Most adults should exercise for at least 150 minutes each week. The exercise should increase your heart rate and make you sweat (moderate-intensity exercise). ? Most adults should also do strengthening exercises at least twice a week. This is in addition to the moderate-intensity exercise.  Maintain a healthy weight  Body mass index (BMI) is a measurement that can be used to identify possible weight problems. It estimates body fat based on height and weight. Your health care provider can help determine your BMI and help you achieve or maintain a healthy weight.  For females 31 years of age and older: ? A BMI below 18.5 is  considered underweight. ? A BMI of 18.5 to 24.9 is normal. ? A BMI of 25 to 29.9 is considered overweight. ? A BMI of 30 and above is considered obese.  Watch levels of cholesterol and blood lipids  You should start having your blood tested for lipids and cholesterol at 62 years of age, then have this test every 5 years.  You may need to have your cholesterol levels checked more often if: ? Your lipid or cholesterol levels are high. ? You are older than 62 years of age. ? You are at high risk for heart disease.  Cancer screening Lung Cancer  Lung cancer screening is recommended for adults 42-50 years old who are at high risk for lung cancer because of a history of smoking.  A yearly low-dose CT scan of the lungs is recommended for people who: ? Currently smoke. ? Have quit within the past 15 years. ? Have at least a 30-pack-year history of smoking. A pack year is smoking an average of one pack of cigarettes a day for 1 year.  Yearly screening should continue until it has been 15 years since you quit.  Yearly screening should stop if you develop a health problem that would prevent you from having lung cancer treatment.  Breast Cancer  Practice breast self-awareness. This means understanding how your breasts normally appear and feel.  It also means doing regular breast self-exams. Let your health care provider know about any changes, no matter how small.  If you are in your 20s or 30s, you should have a clinical breast exam (  CBE) by a health care provider every 1-3 years as part of a regular health exam.  If you are 62 or older, have a CBE every year. Also consider having a breast X-ray (mammogram) every year.  If you have a family history of breast cancer, talk to your health care provider about genetic screening.  If you are at high risk for breast cancer, talk to your health care provider about having an MRI and a mammogram every year.  Breast cancer gene (BRCA) assessment  is recommended for women who have family members with BRCA-related cancers. BRCA-related cancers include: ? Breast. ? Ovarian. ? Tubal. ? Peritoneal cancers.  Results of the assessment will determine the need for genetic counseling and BRCA1 and BRCA2 testing.  Cervical Cancer Your health care provider may recommend that you be screened regularly for cancer of the pelvic organs (ovaries, uterus, and vagina). This screening involves a pelvic examination, including checking for microscopic changes to the surface of your cervix (Pap test). You may be encouraged to have this screening done every 3 years, beginning at age 20.  For women ages 76-65, health care providers may recommend pelvic exams and Pap testing every 3 years, or they may recommend the Pap and pelvic exam, combined with testing for human papilloma virus (HPV), every 5 years. Some types of HPV increase your risk of cervical cancer. Testing for HPV may also be done on women of any age with unclear Pap test results.  Other health care providers may not recommend any screening for nonpregnant women who are considered low risk for pelvic cancer and who do not have symptoms. Ask your health care provider if a screening pelvic exam is right for you.  If you have had past treatment for cervical cancer or a condition that could lead to cancer, you need Pap tests and screening for cancer for at least 20 years after your treatment. If Pap tests have been discontinued, your risk factors (such as having a new sexual partner) need to be reassessed to determine if screening should resume. Some women have medical problems that increase the chance of getting cervical cancer. In these cases, your health care provider may recommend more frequent screening and Pap tests.  Colorectal Cancer  This type of cancer can be detected and often prevented.  Routine colorectal cancer screening usually begins at 62 years of age and continues through 62 years of  age.  Your health care provider may recommend screening at an earlier age if you have risk factors for colon cancer.  Your health care provider may also recommend using home test kits to check for hidden blood in the stool.  A small camera at the end of a tube can be used to examine your colon directly (sigmoidoscopy or colonoscopy). This is done to check for the earliest forms of colorectal cancer.  Routine screening usually begins at age 7.  Direct examination of the colon should be repeated every 5-10 years through 62 years of age. However, you may need to be screened more often if early forms of precancerous polyps or small growths are found.  Skin Cancer  Check your skin from head to toe regularly.  Tell your health care provider about any new moles or changes in moles, especially if there is a change in a mole's shape or color.  Also tell your health care provider if you have a mole that is larger than the size of a pencil eraser.  Always use sunscreen. Apply sunscreen liberally  and repeatedly throughout the day.  Protect yourself by wearing long sleeves, pants, a wide-brimmed hat, and sunglasses whenever you are outside.  Heart disease, diabetes, and high blood pressure  High blood pressure causes heart disease and increases the risk of stroke. High blood pressure is more likely to develop in: ? People who have blood pressure in the high end of the normal range (130-139/85-89 mm Hg). ? People who are overweight or obese. ? People who are African American.  If you are 49-20 years of age, have your blood pressure checked every 3-5 years. If you are 92 years of age or older, have your blood pressure checked every year. You should have your blood pressure measured twice-once when you are at a hospital or clinic, and once when you are not at a hospital or clinic. Record the average of the two measurements. To check your blood pressure when you are not at a hospital or clinic, you  can use: ? An automated blood pressure machine at a pharmacy. ? A home blood pressure monitor.  If you are between 57 years and 75 years old, ask your health care provider if you should take aspirin to prevent strokes.  Have regular diabetes screenings. This involves taking a blood sample to check your fasting blood sugar level. ? If you are at a normal weight and have a low risk for diabetes, have this test once every three years after 62 years of age. ? If you are overweight and have a high risk for diabetes, consider being tested at a younger age or more often. Preventing infection Hepatitis B  If you have a higher risk for hepatitis B, you should be screened for this virus. You are considered at high risk for hepatitis B if: ? You were born in a country where hepatitis B is common. Ask your health care provider which countries are considered high risk. ? Your parents were born in a high-risk country, and you have not been immunized against hepatitis B (hepatitis B vaccine). ? You have HIV or AIDS. ? You use needles to inject street drugs. ? You live with someone who has hepatitis B. ? You have had sex with someone who has hepatitis B. ? You get hemodialysis treatment. ? You take certain medicines for conditions, including cancer, organ transplantation, and autoimmune conditions.  Hepatitis C  Blood testing is recommended for: ? Everyone born from [birth year] through 1965. ? Anyone with known risk factors for hepatitis C.  Sexually transmitted infections (STIs)  You should be screened for sexually transmitted infections (STIs) including gonorrhea and chlamydia if: ? You are sexually active and are younger than 62 years of age. ? You are older than 62 years of age and your health care provider tells you that you are at risk for this type of infection. ? Your sexual activity has changed since you were last screened and you are at an increased risk for chlamydia or gonorrhea. Ask your  health care provider if you are at risk.  If you do not have HIV, but are at risk, it may be recommended that you take a prescription medicine daily to prevent HIV infection. This is called pre-exposure prophylaxis (PrEP). You are considered at risk if: ? You are sexually active and do not regularly use condoms or know the HIV status of your partner(s). ? You take drugs by injection. ? You are sexually active with a partner who has HIV.  Talk with your health care provider about whether  you are at high risk of being infected with HIV. If you choose to begin PrEP, you should first be tested for HIV. You should then be tested every 3 months for as long as you are taking PrEP. Pregnancy  If you are premenopausal and you may become pregnant, ask your health care provider about preconception counseling.  If you may become pregnant, take 400 to 800 micrograms (mcg) of folic acid every day.  If you want to prevent pregnancy, talk to your health care provider about birth control (contraception). Osteoporosis and menopause  Osteoporosis is a disease in which the bones lose minerals and strength with aging. This can result in serious bone fractures. Your risk for osteoporosis can be identified using a bone density scan.  If you are 32 years of age or older, or if you are at risk for osteoporosis and fractures, ask your health care provider if you should be screened.  Ask your health care provider whether you should take a calcium or vitamin D supplement to lower your risk for osteoporosis.  Menopause may have certain physical symptoms and risks.  Hormone replacement therapy may reduce some of these symptoms and risks. Talk to your health care provider about whether hormone replacement therapy is right for you. Follow these instructions at home:  Schedule regular health, dental, and eye exams.  Stay current with your immunizations.  Do not use any tobacco products including cigarettes, chewing  tobacco, or electronic cigarettes.  If you are pregnant, do not drink alcohol.  If you are breastfeeding, limit how much and how often you drink alcohol.  Limit alcohol intake to no more than 1 drink per day for nonpregnant women. One drink equals 12 ounces of beer, 5 ounces of wine, or 1 ounces of hard liquor.  Do not use street drugs.  Do not share needles.  Ask your health care provider for help if you need support or information about quitting drugs.  Tell your health care provider if you often feel depressed.  Tell your health care provider if you have ever been abused or do not feel safe at home. This information is not intended to replace advice given to you by your health care provider. Make sure you discuss any questions you have with your health care provider. Document Released: 09/30/2010 Document Revised: 08/23/2015 Document Reviewed: 12/19/2014 Elsevier Interactive Patient Education  [employer].

## 2017-04-02 LAB — URINALYSIS W MICROSCOPIC + REFLEX CULTURE
BACTERIA UA: NONE SEEN /HPF
Bilirubin Urine: NEGATIVE
Glucose, UA: NEGATIVE
HYALINE CAST: NONE SEEN /LPF
Ketones, ur: NEGATIVE
Leukocyte Esterase: NEGATIVE
Nitrites, Initial: NEGATIVE
Protein, ur: NEGATIVE
SPECIFIC GRAVITY, URINE: 1.022 (ref 1.001–1.03)
SQUAMOUS EPITHELIAL / LPF: NONE SEEN /HPF (ref <=5)
WBC, UA: NONE SEEN /HPF (ref 0–5)
pH: <=5 (ref 5.0–8.0)

## 2017-04-02 LAB — HEPATITIS C ANTIBODY
HEP C AB: NONREACTIVE
SIGNAL TO CUT-OFF: 0.03 (ref <1.00)

## 2017-04-02 LAB — HIV ANTIBODY (ROUTINE TESTING W REFLEX): HIV: NONREACTIVE

## 2017-04-02 LAB — NO CULTURE INDICATED

## 2017-04-03 ENCOUNTER — Ambulatory Visit: Payer: 59 | Admitting: Nurse Practitioner

## 2017-04-03 ENCOUNTER — Encounter: Payer: Self-pay | Admitting: Nurse Practitioner

## 2017-04-03 VITALS — BP 114/80 | HR 103 | Temp 98.1°F | Ht 65.0 in | Wt 161.0 lb

## 2017-04-03 DIAGNOSIS — B349 Viral infection, unspecified: Secondary | ICD-10-CM | POA: Diagnosis not present

## 2017-04-03 LAB — CYTOLOGY - PAP
Diagnosis: NEGATIVE
HPV: NOT DETECTED

## 2017-04-03 LAB — H PYLORI, IGM, IGG, IGA AB
H PYLORI IGG: 8.77 {index_val} — AB (ref 0.00–0.79)
H. PYLORI, IGA ABS: 12.7 U — AB (ref 0.0–8.9)
H. PYLORI, IGM ABS: 9.7 U — AB (ref 0.0–8.9)

## 2017-04-03 LAB — POC INFLUENZA A&B (BINAX/QUICKVUE)
INFLUENZA A, POC: NEGATIVE
INFLUENZA B, POC: NEGATIVE

## 2017-04-03 MED ORDER — ONDANSETRON HCL 4 MG PO TABS: 4.0000 mg | ORAL_TABLET | Freq: Three times a day (TID) | ORAL | 0 refills | Status: DC | PRN

## 2017-04-03 MED ORDER — OSELTAMIVIR PHOSPHATE 75 MG PO CAPS: 75.0000 mg | ORAL_CAPSULE | Freq: Two times a day (BID) | ORAL | 0 refills | Status: DC

## 2017-04-03 MED ORDER — FENOFIBRATE 145 MG PO TABS: 145.0000 mg | ORAL_TABLET | Freq: Every day | ORAL | 1 refills | Status: DC

## 2017-04-03 MED ORDER — BISMUTH SUBSALICYLATE 262 MG/15ML PO SUSP: 30.0000 mL | Freq: Four times a day (QID) | ORAL | 0 refills | Status: DC | PRN

## 2017-04-03 MED ORDER — ACETAMINOPHEN 325 MG PO TABS: 650.0000 mg | ORAL_TABLET | Freq: Four times a day (QID) | ORAL | Status: DC | PRN

## 2017-04-03 NOTE — Progress Notes (Signed)
Subjective:  Patient ID: Sheri Rice, female    DOB: 1955-12-11  Age: 62 y.o. MRN: [id number]  CC: Headache (headache,chills,diarrhea,fever/2 day)   Fever   This is a new problem. The current episode started in the past 7 days. The problem occurs intermittently. The problem has been waxing and waning. Her temperature was unmeasured prior to arrival. Associated symptoms include diarrhea, headaches, muscle aches and nausea. Pertinent negatives include no abdominal pain, congestion, coughing, ear pain, rash, sleepiness, sore throat, urinary pain, vomiting or wheezing. She has tried acetaminophen for the symptoms. The treatment provided mild relief.  Risk factors: sick contacts   Risk factors: no contaminated food, no contaminated water, no hx of cancer, no immunosuppression, no occupational exposure, no recent sickness and no recent travel     Outpatient Medications Prior to Visit  Medication Sig Dispense Refill  . cholecalciferol (VITAMIN D) 1000 units tablet Take 1 tablet (1,000 Units total) by mouth daily.    . ferrous sulfate 325 (65 FE) MG tablet Take 1 tablet (325 mg total) by mouth daily with breakfast.  3  . Gluc-Chonn-MSM-Boswellia-Vit D (GLUCOSAMINE CHONDROITIN COMPLX) TABS Take 1 tablet by mouth 2 (two) times daily.    . Multiple Vitamins-Minerals (CENTRUM ADULTS) TABS Take 1 tablet by mouth.    Marland Kitchen omeprazole (PRILOSEC) 20 MG capsule Take 1 capsule (20 mg total) by mouth daily before breakfast. 30 capsule 1   No facility-administered medications prior to visit.     ROS See HPI  Objective:  BP 114/80   Pulse (!) 103   Temp 98.1 F (36.7 C)   Ht 5\' 5"  (1.651 m)   Wt 161 lb (73 kg)   SpO2 95%   BMI 26.79 kg/m   BP Readings from Last 3 Encounters:  04/03/17 114/80  04/01/17 114/74  02/23/17 120/78    Wt Readings from Last 3 Encounters:  04/03/17 161 lb (73 kg)  04/01/17 163 lb (73.9 kg)  02/23/17 161 lb (73 kg)    Physical Exam  Constitutional: She is  oriented to person, place, and time.  HENT:  Right Ear: External ear normal.  Left Ear: External ear normal.  Nose: Nose normal.  Mouth/Throat: Oropharynx is clear and moist.  Eyes: Conjunctivae and EOM are normal. Pupils are equal, round, and reactive to light.  Neck: Normal range of motion. Neck supple.  Cardiovascular: Normal rate and regular rhythm.  Pulmonary/Chest: Effort normal and breath sounds normal.  Abdominal: Soft. She exhibits no distension and no mass. There is tenderness. There is no rebound and no guarding.  Musculoskeletal: She exhibits no edema.  Lymphadenopathy:    She has no cervical adenopathy.  Neurological: She is alert and oriented to person, place, and time.  Skin: No rash noted. She is diaphoretic. No erythema.  Psychiatric: She has a normal mood and affect.  Vitals reviewed.   Lab Results  Component Value Date   WBC 9.9 04/01/2017   HGB 12.8 04/01/2017   HCT 37.7 04/01/2017   PLT 269.0 04/01/2017   GLUCOSE 79 04/01/2017   CHOL 221 (H) 04/01/2017   TRIG 338.0 (H) 04/01/2017   HDL 43.40 04/01/2017   LDLDIRECT 112.0 04/01/2017   LDLCALC 94 06/11/2007   ALT 19 04/01/2017   AST 20 04/01/2017   NA 139 04/01/2017   K 3.5 04/01/2017   CL 102 04/01/2017   CREATININE 0.73 04/01/2017   BUN 16 04/01/2017   CO2 28 04/01/2017   TSH 2.97 04/01/2017   HGBA1C 5.4 03/03/2016  Assessment & Plan:   Sheri Rice was seen today for headache.  Diagnoses and all orders for this visit:  Viral illness -     POC Influenza A&B(BINAX/QUICKVUE) -     ondansetron (ZOFRAN) 4 MG tablet; Take 1 tablet (4 mg total) by mouth every 8 (eight) hours as needed for nausea or vomiting. -     oseltamivir (TAMIFLU) 75 MG capsule; Take 1 capsule (75 mg total) by mouth 2 (two) times daily. -     bismuth subsalicylate (PEPTO-BISMOL) 262 MG/15ML suspension; Take 30 mLs by mouth every 6 (six) hours as needed. -     acetaminophen (TYLENOL) 325 MG tablet; Take 2 tablets (650 mg total)  by mouth every 6 (six) hours as needed.   I am having Sheri Rice start on ondansetron, oseltamivir, bismuth subsalicylate, and acetaminophen. I am also having her maintain her ferrous sulfate, CENTRUM ADULTS, cholecalciferol, GLUCOSAMINE CHONDROITIN COMPLX, and omeprazole.  Meds ordered this encounter  Medications  . ondansetron (ZOFRAN) 4 MG tablet    Sig: Take 1 tablet (4 mg total) by mouth every 8 (eight) hours as needed for nausea or vomiting.    Dispense:  30 tablet    Refill:  0    Order Specific Question:   Supervising Provider    Answer:   Lucille Passy [[id number]]  . oseltamivir (TAMIFLU) 75 MG capsule    Sig: Take 1 capsule (75 mg total) by mouth 2 (two) times daily.    Dispense:  10 capsule    Refill:  0    Order Specific Question:   Supervising Provider    Answer:   Lucille Passy [[id number]]  . bismuth subsalicylate (PEPTO-BISMOL) 262 MG/15ML suspension    Sig: Take 30 mLs by mouth every 6 (six) hours as needed.    Dispense:  360 mL    Refill:  0    Order Specific Question:   Supervising Provider    Answer:   Lucille Passy [[id number]]  . acetaminophen (TYLENOL) 325 MG tablet    Sig: Take 2 tablets (650 mg total) by mouth every 6 (six) hours as needed.    Order Specific Question:   Supervising Provider    Answer:   Lucille Passy [[id number]]    Follow-up: Return if symptoms worsen or fail to improve.  Wilfred Lacy, NP

## 2017-04-03 NOTE — Patient Instructions (Signed)
Push oral hydration  Viral Illness, Adult Viruses are tiny germs that can get into a person's body and cause illness. There are many different types of viruses, and they cause many types of illness. Viral illnesses can range from mild to severe. They can affect various parts of the body. Common illnesses that are caused by a virus include colds and the flu. Viral illnesses also include serious conditions such as HIV/AIDS (human immunodeficiency virus/acquired immunodeficiency syndrome). A few viruses have been linked to certain cancers. What are the causes? Many types of viruses can cause illness. Viruses invade cells in your body, multiply, and cause the infected cells to malfunction or die. When the cell dies, it releases more of the virus. When this happens, you develop symptoms of the illness, and the virus continues to spread to other cells. If the virus takes over the function of the cell, it can cause the cell to divide and grow out of control, as is the case when a virus causes cancer. Different viruses get into the body in different ways. You can get a virus by:  Swallowing food or water that is contaminated with the virus.  Breathing in droplets that have been coughed or sneezed into the air by an infected person.  Touching a surface that has been contaminated with the virus and then touching your eyes, nose, or mouth.  Being bitten by an insect or animal that carries the virus.  Having sexual contact with a person who is infected with the virus.  Being exposed to blood or fluids that contain the virus, either through an open cut or during a transfusion.  If a virus enters your body, your body's defense system (immune system) will try to fight the virus. You may be at higher risk for a viral illness if your immune system is weak. What are the signs or symptoms? Symptoms vary depending on the type of virus and the location of the cells that it invades. Common symptoms of the main  types of viral illnesses include: Cold and flu viruses  Fever.  Headache.  Sore throat.  Muscle aches.  Nasal congestion.  Cough. Digestive system (gastrointestinal) viruses  Fever.  Abdominal pain.  Nausea.  Diarrhea. Liver viruses (hepatitis)  Loss of appetite.  Tiredness.  Yellowing of the skin (jaundice). Brain and spinal cord viruses  Fever.  Headache.  Stiff neck.  Nausea and vomiting.  Confusion or sleepiness. Skin viruses  Warts.  Itching.  Rash. Sexually transmitted viruses  Discharge.  Swelling.  Redness.  Rash. How is this treated? Viruses can be difficult to treat because they live within cells. Antibiotic medicines do not treat viruses because these drugs do not get inside cells. Treatment for a viral illness may include:  Resting and drinking plenty of fluids.  Medicines to relieve symptoms. These can include over-the-counter medicine for pain and fever, medicines for cough or congestion, and medicines to relieve diarrhea.  Antiviral medicines. These drugs are available only for certain types of viruses. They may help reduce flu symptoms if taken early. There are also many antiviral medicines for hepatitis and HIV/AIDS.  Some viral illnesses can be prevented with vaccinations. A common example is the flu shot. Follow these instructions at home: Medicines   Take over-the-counter and prescription medicines only as told by your health care provider.  If you were prescribed an antiviral medicine, take it as told by your health care provider. Do not stop taking the medicine even if you start to feel  better.  Be aware of when antibiotics are needed and when they are not needed. Antibiotics do not treat viruses. If your health care provider thinks that you may have a bacterial infection as well as a viral infection, you may get an antibiotic. ? Do not ask for an antibiotic prescription if you have been diagnosed with a viral illness.  That will not make your illness go away faster. ? Frequently taking antibiotics when they are not needed can lead to antibiotic resistance. When this develops, the medicine no longer works against the bacteria that it normally fights. General instructions  Drink enough fluids to keep your urine clear or pale yellow.  Rest as much as possible.  Return to your normal activities as told by your health care provider. Ask your health care provider what activities are safe for you.  Keep all follow-up visits as told by your health care provider. This is important. How is this prevented? Take these actions to reduce your risk of viral infection:  Eat a healthy diet and get enough rest.  Wash your hands often with soap and water. This is especially important when you are in public places. If soap and water are not available, use hand sanitizer.  Avoid close contact with friends and family who have a viral illness.  If you travel to areas where viral gastrointestinal infection is common, avoid drinking water or eating raw food.  Keep your immunizations up to date. Get a flu shot every year as told by your health care provider.  Do not share toothbrushes, nail clippers, razors, or needles with other people.  Always practice safe sex.  Contact a health care provider if:  You have symptoms of a viral illness that do not go away.  Your symptoms come back after going away.  Your symptoms get worse. Get help right away if:  You have trouble breathing.  You have a severe headache or a stiff neck.  You have severe vomiting or abdominal pain. This information is not intended to replace advice given to you by your health care provider. Make sure you discuss any questions you have with your health care provider. Document Released: 07/27/2015 Document Revised: 08/29/2015 Document Reviewed: 07/27/2015 Elsevier Interactive Patient Education  2018 [place] Choices to Help Relieve  Diarrhea, Adult When you have diarrhea, the foods you eat and your eating habits are very important. Choosing the right foods and drinks can help:  Relieve diarrhea.  Replace lost fluids and nutrients.  Prevent dehydration.  What general guidelines should I follow? Relieving diarrhea  Choose foods with less than 2 g or .07 oz. of fiber per serving.  Limit fats to less than 8 tsp (38 g or 1.34 oz.) a day.  Avoid the following: ? Foods and beverages sweetened with high-fructose corn syrup, honey, or sugar alcohols such as xylitol, sorbitol, and mannitol. ? Foods that contain a lot of fat or sugar. ? Fried, greasy, or spicy foods. ? High-fiber grains, breads, and cereals. ? Raw fruits and vegetables.  Eat foods that are rich in probiotics. These foods include dairy products such as yogurt and fermented milk products. They help increase healthy bacteria in the stomach and intestines (gastrointestinal tract, or GI tract).  If you have lactose intolerance, avoid dairy products. These may make your diarrhea worse.  Take medicine to help stop diarrhea (antidiarrheal medicine) only as told by your health care provider. Replacing nutrients  Eat small meals or snacks every 3-4 hours.  Eat bland foods, such as white rice, toast, or baked potato, until your diarrhea starts to get better. Gradually reintroduce nutrient-rich foods as tolerated or as told by your health care provider. This includes: ? Well-cooked protein foods. ? Peeled, seeded, and soft-cooked fruits and vegetables. ? Low-fat dairy products.  Take vitamin and mineral supplements as told by your health care provider. Preventing dehydration   Start by sipping water or a special solution to prevent dehydration (oral rehydration solution, ORS). Urine that is clear or pale yellow means that you are getting enough fluid.  Try to drink at least 8-10 cups of fluid each day to help replace lost fluids.  You may add other liquids  in addition to water, such as clear juice or decaffeinated sports drinks, as tolerated or as told by your health care provider.  Avoid drinks with caffeine, such as coffee, tea, or soft drinks.  Avoid alcohol. What foods are recommended? The items listed may not be a complete list. Talk with your health care provider about what dietary choices are best for you. Grains White rice. White, [place], or pita breads (fresh or toasted), including plain rolls, buns, or bagels. White pasta. Saltine, soda, or graham crackers. Pretzels. Low-fiber cereal. Cooked cereals made with water (such as cornmeal, farina, or cream cereals). Plain muffins. Matzo. Melba toast. Zwieback. Vegetables Potatoes (without the skin). Most well-cooked and canned vegetables without skins or seeds. Tender lettuce. Fruits Apple sauce. Fruits canned in juice. Cooked apricots, cherries, grapefruit, peaches, pears, or plums. Fresh bananas and cantaloupe. Meats and other protein foods Baked or boiled chicken. Eggs. Tofu. Fish. Seafood. Smooth nut butters. Ground or well-cooked tender beef, ham, veal, lamb, pork, or poultry. Dairy Plain yogurt, kefir, and unsweetened liquid yogurt. Lactose-free milk, buttermilk, skim milk, or soy milk. Low-fat or nonfat hard cheese. Beverages Water. Low-calorie sports drinks. Fruit juices without pulp. Strained tomato and vegetable juices. Decaffeinated teas. Sugar-free beverages not sweetened with sugar alcohols. Oral rehydration solutions, if approved by your health care provider. Seasoning and other foods Bouillon, broth, or soups made from recommended foods. What foods are not recommended? The items listed may not be a complete list. Talk with your health care provider about what dietary choices are best for you. Grains Whole grain, whole wheat, bran, or rye breads, rolls, pastas, and crackers. Wild or brown rice. Whole grain or bran cereals. Barley. Oats and oatmeal. Corn tortillas or taco  shells. Granola. Popcorn. Vegetables Raw vegetables. Fried vegetables. Cabbage, broccoli, Brussels sprouts, artichokes, baked beans, beet greens, corn, kale, legumes, peas, sweet potatoes, and yams. Potato skins. Cooked spinach and cabbage. Fruits Dried fruit, including raisins and dates. Raw fruits. Stewed or dried prunes. Canned fruits with syrup. Meat and other protein foods Fried or fatty meats. Deli meats. Chunky nut butters. Nuts and seeds. Beans and lentils. Berniece Salines. Hot dogs. Sausage. Dairy High-fat cheeses. Whole milk, chocolate milk, and beverages made with milk, such as milk shakes. Half-and-half. Cream. sour cream. Ice cream. Beverages Caffeinated beverages (such as coffee, tea, soda, or energy drinks). Alcoholic beverages. Fruit juices with pulp. Prune juice. Soft drinks sweetened with high-fructose corn syrup or sugar alcohols. High-calorie sports drinks. Fats and oils Butter. Cream sauces. Margarine. Salad oils. Plain salad dressings. Olives. Avocados. Mayonnaise. Sweets and desserts Sweet rolls, doughnuts, and sweet breads. Sugar-free desserts sweetened with sugar alcohols such as xylitol and sorbitol. Seasoning and other foods Honey. Hot sauce. Chili powder. Gravy. Cream-based or milk-based soups. Pancakes and waffles. Summary  When you have diarrhea,  the foods you eat and your eating habits are very important.  Make sure you get at least 8-10 cups of fluid each day, or enough to keep your urine clear or pale yellow.  Eat bland foods and gradually reintroduce healthy, nutrient-rich foods as tolerated, or as told by your health care provider.  Avoid high-fiber, fried, greasy, or spicy foods. This information is not intended to replace advice given to you by your health care provider. Make sure you discuss any questions you have with your health care provider. Document Released: 06/07/2003 Document Revised: 03/14/2016 Document Reviewed: 03/14/2016 Elsevier Interactive Patient  Education  [employer].

## 2017-04-06 ENCOUNTER — Telehealth: Payer: Self-pay | Admitting: Nurse Practitioner

## 2017-04-06 DIAGNOSIS — R09A2 Foreign body sensation, throat: Secondary | ICD-10-CM | POA: Insufficient documentation

## 2017-04-06 DIAGNOSIS — E781 Pure hyperglyceridemia: Secondary | ICD-10-CM | POA: Insufficient documentation

## 2017-04-06 DIAGNOSIS — F458 Other somatoform disorders: Secondary | ICD-10-CM | POA: Insufficient documentation

## 2017-04-06 MED ORDER — FENOFIBRATE 54 MG PO TABS: 54.0000 mg | ORAL_TABLET | Freq: Every day | ORAL | 2 refills | Status: DC

## 2017-04-06 NOTE — Assessment & Plan Note (Signed)
Normal urinalysis.

## 2017-04-06 NOTE — Telephone Encounter (Signed)
Spoke with the pt, she said she will have someone call back that can speak [place]. Need to inform her that we changed the dosage of her medication Fenofibrate 145mg  to Fenofibrate 45mg . CRM

## 2017-04-06 NOTE — Telephone Encounter (Signed)
Pt informed of medication dosage change; Fenofibrate 145mg  to 54mg .

## 2017-04-06 NOTE — Assessment & Plan Note (Signed)
Start fenofibrate ?

## 2017-04-06 NOTE — Telephone Encounter (Signed)
Pt is aware.  

## 2017-04-06 NOTE — Assessment & Plan Note (Signed)
Continue omeprazole. Entered GI referral

## 2017-04-06 NOTE — Telephone Encounter (Signed)
Received massage from [facility] on [address] stating PA for Fenofibrate 145mg .   Spoke with the pharmacy rep, she said that insurance will cover Fenofibrate 54 mg or 160 mg. Please avdvise?

## 2017-05-08 ENCOUNTER — Ambulatory Visit: Payer: 59 | Admitting: Internal Medicine

## 2017-05-08 ENCOUNTER — Encounter: Payer: Self-pay | Admitting: Internal Medicine

## 2017-05-08 VITALS — BP 110/78 | HR 84 | Ht 61.61 in | Wt 160.5 lb

## 2017-05-08 DIAGNOSIS — Z1211 Encounter for screening for malignant neoplasm of colon: Secondary | ICD-10-CM | POA: Diagnosis not present

## 2017-05-08 DIAGNOSIS — K219 Gastro-esophageal reflux disease without esophagitis: Secondary | ICD-10-CM | POA: Diagnosis not present

## 2017-05-08 DIAGNOSIS — R1310 Dysphagia, unspecified: Secondary | ICD-10-CM

## 2017-05-08 MED ORDER — NA SULFATE-K SULFATE-MG SULF 17.5-3.13-1.6 GM/177ML PO SOLN: 1.0000 | Freq: Once | ORAL | 0 refills | Status: AC

## 2017-05-08 NOTE — Patient Instructions (Signed)

## 2017-05-08 NOTE — Progress Notes (Signed)
HISTORY OF PRESENT ILLNESS:  Sheri Rice is a 62 y.o. 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 Patient reports problems with heartburn and indigestion as well as mild intermittent solid food dysphagia. Also globus sensation. She was placed on PPI with improvement in classic reflux symptoms. However, globus type sensation has persisted. Blood work from January 2019 was unremarkable including normal comprehensive metabolic panel and CBC. Patient has not had prior GI evaluation or screening colonoscopy as recommended by her PCP. She is accompanied today by a professional medical interpreter. Patient's GI review of systems is otherwise negative.  REVIEW OF SYSTEMS:  All non-GI ROS negative except for  Past Medical History:  Diagnosis Date  . Allergy   . GERD (gastroesophageal reflux disease)     Past Surgical History:  Procedure Laterality Date  . DILATION AND CURETTAGE OF UTERUS      Social History Sheri Rice  reports that  has never smoked. she has never used smokeless tobacco. She reports that she does not drink alcohol or use drugs.  family history includes Depression in her mother; Diabetes in her mother; Stroke in her maternal grandmother.  No Known Allergies     PHYSICAL EXAMINATION: Vital signs: BP 110/78   Pulse 84   Ht 5' 1.61" (1.565 m)   Wt 160 lb 8 oz (72.8 kg)   BMI 29.72 kg/m   Constitutional: generally well-appearing, no acute distress Psychiatric: alert and oriented x3, cooperative Eyes: extraocular movements intact, anicteric, conjunctiva pink Mouth: oral pharynx moist, no lesions Neck: supple without thyromegaly Lymph: no supraclavicular lymphadenopathy Cardiovascular: heart regular rate and rhythm, no murmur Lungs: clear to auscultation bilaterally Abdomen: soft, nontender, nondistended, no  obvious ascites, no peritoneal signs, normal bowel sounds, no organomegaly Rectal: Deferred until colonoscopy Extremities: no clubbing, cyanosis, or lower extremity edema bilaterally Skin: no lesions on visible extremities Neuro: No focal deficits. Cranial nerves intact  ASSESSMENT:  #1. GERD. Classic symptoms have responded PPI #2. Mild intermittent solid food dysphagia. Rule out peptic stricture #3. Globus sensation. May be related to GERD. May be functional #4. Colon cancer screening. The patient is interested in optical colonoscopy after reviewing the options   PLAN:  #1. Reflux precautions #2. Continue omeprazole 20 mg daily #3. Schedule upper endoscopy with possible esophageal dilation.The nature of the procedure, as well as the risks, benefits, and alternatives were carefully and thoroughly reviewed with the patient. Ample time for discussion and questions allowed. The patient understood, was satisfied, and agreed to proceed. #4. Schedule screening colonoscopy.The nature of the procedure, as well as the risks, benefits, and alternatives were carefully and thoroughly reviewed with the patient. Ample time for discussion and questions allowed. The patient understood, was satisfied, and agreed to proceed.  A copy of this consultation note has been sent to Wilfred Lacy, NP

## 2017-06-02 ENCOUNTER — Ambulatory Visit
Admission: RE | Admit: 2017-06-02 | Discharge: 2017-06-02 | Disposition: A | Discharge status: Home / Self Care | Payer: 59 | Source: Ambulatory Visit | Attending: Nurse Practitioner | Admitting: Nurse Practitioner

## 2017-06-03 ENCOUNTER — Other Ambulatory Visit: Payer: Self-pay | Admitting: Nurse Practitioner

## 2017-06-03 DIAGNOSIS — R928 Other abnormal and inconclusive findings on diagnostic imaging of breast: Secondary | ICD-10-CM

## 2017-06-03 NOTE — Addendum Note (Signed)
Addended by: Wilfred Lacy L on: 06/03/2017 07:50 AM   Modules accepted: Orders

## 2017-06-11 ENCOUNTER — Other Ambulatory Visit: Payer: Self-pay | Admitting: Nurse Practitioner

## 2017-06-11 ENCOUNTER — Ambulatory Visit
Admission: RE | Admit: 2017-06-11 | Discharge: 2017-06-11 | Disposition: A | Discharge status: Home / Self Care | Payer: 59 | Source: Ambulatory Visit | Attending: Nurse Practitioner | Admitting: Nurse Practitioner

## 2017-06-11 DIAGNOSIS — R928 Other abnormal and inconclusive findings on diagnostic imaging of breast: Secondary | ICD-10-CM

## 2017-06-15 ENCOUNTER — Encounter: Payer: Self-pay | Admitting: Internal Medicine

## 2017-06-25 ENCOUNTER — Telehealth: Payer: Self-pay | Admitting: Internal Medicine

## 2017-06-25 MED ORDER — NA SULFATE-K SULFATE-MG SULF 17.5-3.13-1.6 GM/177ML PO SOLN: 1.0000 | Freq: Once | ORAL | 0 refills | Status: AC

## 2017-06-25 NOTE — Telephone Encounter (Signed)
Resent Suprep

## 2017-06-29 ENCOUNTER — Other Ambulatory Visit: Payer: Self-pay

## 2017-06-29 ENCOUNTER — Ambulatory Visit (AMBULATORY_SURGERY_CENTER): Payer: 59 | Admitting: Internal Medicine

## 2017-06-29 ENCOUNTER — Encounter: Payer: Self-pay | Admitting: Internal Medicine

## 2017-06-29 VITALS — BP 102/60 | HR 64 | Temp 98.2°F | Resp 13 | Ht 61.0 in | Wt 160.0 lb

## 2017-06-29 DIAGNOSIS — Z1211 Encounter for screening for malignant neoplasm of colon: Secondary | ICD-10-CM | POA: Diagnosis not present

## 2017-06-29 DIAGNOSIS — K219 Gastro-esophageal reflux disease without esophagitis: Secondary | ICD-10-CM

## 2017-06-29 HISTORY — PX: COLONOSCOPY: SHX174

## 2017-06-29 HISTORY — PX: ESOPHAGOGASTRODUODENOSCOPY: SHX1529

## 2017-06-29 MED ORDER — SODIUM CHLORIDE 0.9 % IV SOLN: 500.0000 mL | Freq: Once | INTRAVENOUS | Status: AC

## 2017-06-29 NOTE — Op Note (Signed)
[facility] Patient Name: Sheri Rice Procedure Date: 06/29/2017 1:13 PM MRN: [id number] Endoscopist: Docia Chuck. Henrene Pastor , MD Age: 62 Referring MD:  Date of Birth: [birth year] Gender: Female Account #: [id number] Procedure:                Upper GI endoscopy Indications:              Dysphagia, Esophageal reflux Medicines:                Monitored Anesthesia Care Procedure:                Pre-Anesthesia Assessment:                           - Prior to the procedure, a History and Physical                            was performed, and patient medications and                            allergies were reviewed. The patient's tolerance of                            previous anesthesia was also reviewed. The risks                            and benefits of the procedure and the sedation                            options and risks were discussed with the patient.                            All questions were answered, and informed consent                            was obtained. Prior Anticoagulants: The patient has                            taken no previous anticoagulant or antiplatelet                            agents. ASA Grade Assessment: I - A normal, healthy                            patient. After reviewing the risks and benefits,                            the patient was deemed in satisfactory condition to                            undergo the procedure.                           After obtaining informed consent, the endoscope was  passed under direct vision. Throughout the                            procedure, the patient's blood pressure, pulse, and                            oxygen saturations were monitored continuously. The                            Model [id number]-[id number]) scope was introduced                            through the mouth, and advanced to the second part                            of duodenum. The upper GI endoscopy  was                            accomplished without difficulty. The patient                            tolerated the procedure well. Scope In: Scope Out: Findings:                 The esophagus was normal.                           The stomach was normal.                           The examined duodenum was normal.                           The cardia and gastric fundus were normal on                            retroflexion. Complications:            No immediate complications. Estimated Blood Loss:     Estimated blood loss: none. Impression:               1. Normal EGD                           2. GERD Recommendation:           1. Reflux precautions                           2. Continue omeprazole daily to control reflux                            symptoms                           3. Return to the care of your primary provider Docia Chuck. Henrene Pastor, MD 06/29/2017 1:42:50 PM This report has been signed electronically.

## 2017-06-29 NOTE — Patient Instructions (Signed)
YOU HAD AN ENDOSCOPIC PROCEDURE TODAY: Refer to the procedure report and other information in the discharge instructions given to you for any specific questions about what was found during the examination. If this information does not answer your questions, please call [facility] office at [phone] to clarify.  ° °YOU SHOULD EXPECT: Some feelings of bloating in the abdomen. Passage of more gas than usual. Walking can help get rid of the air that was put into your GI tract during the procedure and reduce the bloating. If you had a lower endoscopy (such as a colonoscopy or flexible sigmoidoscopy) you may notice spotting of blood in your stool or on the toilet paper. Some abdominal soreness may be present for a day or two, also. ° °DIET: Your first meal following the procedure should be a light meal and then it is ok to progress to your normal diet. A half-sandwich or bowl of soup is an example of a good first meal. Heavy or fried foods are harder to digest and may make you feel nauseous or bloated. Drink plenty of fluids but you should avoid alcoholic beverages for 24 hours. If you had a esophageal dilation, please see attached instructions for diet.   ° °ACTIVITY: Your care partner should take you home directly after the procedure. You should plan to take it easy, moving slowly for the rest of the day. You can resume normal activity the day after the procedure however YOU SHOULD NOT DRIVE, use power tools, machinery or perform tasks that involve climbing or major physical exertion for 24 hours (because of the sedation medicines used during the test).  ° °SYMPTOMS TO REPORT IMMEDIATELY: °A gastroenterologist can be reached at any hour. Please call [phone]  for any of the following symptoms:  °Following lower endoscopy (colonoscopy, flexible sigmoidoscopy) °Excessive amounts of blood in the stool  °Significant tenderness, worsening of abdominal pains  °Swelling of the abdomen that is new, acute  °Fever of 100° or  higher  °Following upper endoscopy (EGD, EUS, ERCP, esophageal dilation) °Vomiting of blood or coffee ground material  °New, significant abdominal pain  °New, significant chest pain or pain under the shoulder blades  °Painful or persistently difficult swallowing  °New shortness of breath  °Black, tarry-looking or red, bloody stools ° °FOLLOW UP:  °If any biopsies were taken you will be contacted by phone or by letter within the next 1-3 weeks. Call [phone]  if you have not heard about the biopsies in 3 weeks.  °Please also call with any specific questions about appointments or follow up tests. ° °

## 2017-06-29 NOTE — Op Note (Signed)
[place] Patient Name: Sheri Rice Procedure Date: 06/29/2017 1:14 PM MRN: [id number] Endoscopist: Docia Chuck. Henrene Pastor , MD Age: 62 Referring MD:  Date of Birth: Aug 08, 1955 Gender: Female Account #: [id number] Procedure:                Colonoscopy Indications:              Screening for colorectal malignant neoplasm Medicines:                Monitored Anesthesia Care Procedure:                Pre-Anesthesia Assessment:                           - Prior to the procedure, a History and Physical                            was performed, and patient medications and                            allergies were reviewed. The patient's tolerance of                            previous anesthesia was also reviewed. The risks                            and benefits of the procedure and the sedation                            options and risks were discussed with the patient.                            All questions were answered, and informed consent                            was obtained. Prior Anticoagulants: The patient has                            taken no previous anticoagulant or antiplatelet                            agents. ASA Grade Assessment: I - A normal, healthy                            patient. After reviewing the risks and benefits,                            the patient was deemed in satisfactory condition to                            undergo the procedure.                           After obtaining informed consent, the colonoscope  was passed under direct vision. Throughout the                            procedure, the patient's blood pressure, pulse, and                            oxygen saturations were monitored continuously. The                            Colonoscope was introduced through the anus and                            advanced to the the cecum, identified by                            appendiceal orifice and ileocecal  valve. The                            ileocecal valve, appendiceal orifice, and rectum                            were photographed. The quality of the bowel                            preparation was excellent. The colonoscopy was                            performed without difficulty. The patient tolerated                            the procedure well. The bowel preparation used was                            SUPREP. Scope In: 1:19:08 PM Scope Out: 1:30:51 PM Scope Withdrawal Time: 0 hours 9 minutes 4 seconds  Total Procedure Duration: 0 hours 11 minutes 43 seconds  Findings:                 Internal hemorrhoids were found during                            retroflexion. The hemorrhoids were small.                           The exam was otherwise without abnormality on                            direct and retroflexion views. Complications:            No immediate complications. Estimated blood loss:                            None. Estimated Blood Loss:     Estimated blood loss: none. Impression:               - Internal hemorrhoids.                           -  The examination was otherwise normal on direct                            and retroflexion views.                           - No specimens collected. Recommendation:           - Repeat colonoscopy in 10 years for screening                            purposes.                           - Patient has a contact number available for                            emergencies. The signs and symptoms of potential                            delayed complications were discussed with the                            patient. Return to normal activities tomorrow.                            Written discharge instructions were provided to the                            patient.                           - Resume previous diet.                           - Continue present medications.                           - EGD today. Please see  report Docia Chuck. Henrene Pastor, MD 06/29/2017 1:34:04 PM This report has been signed electronically.

## 2017-06-29 NOTE — Progress Notes (Signed)
Pt's states no medical or surgical changes since previsit or office visit. 

## 2017-06-30 ENCOUNTER — Telehealth: Payer: Self-pay

## 2017-06-30 ENCOUNTER — Ambulatory Visit: Payer: 59 | Admitting: Nurse Practitioner

## 2017-06-30 NOTE — Telephone Encounter (Signed)
  Follow up Call-  Call back number 06/29/2017  Post procedure Call Back phone  # [phone]  Permission to leave phone message Yes  Some recent data might be hidden     Patient questions:  Do you have a fever, pain , or abdominal swelling? No. Pain Score  0 *  Have you tolerated food without any problems? Yes.    Have you been able to return to your normal activities? Yes.    Do you have any questions about your discharge instructions: Diet   No. Medications  No. Follow up visit  No.  Do you have questions or concerns about your Care? No.  Actions: * If pain score is 4 or above: No action needed, pain <4.  No problems noted per pt. maw

## 2017-07-24 ENCOUNTER — Other Ambulatory Visit: Payer: Self-pay | Admitting: Nurse Practitioner

## 2017-07-24 DIAGNOSIS — K219 Gastro-esophageal reflux disease without esophagitis: Secondary | ICD-10-CM

## 2018-06-02 ENCOUNTER — Encounter: Payer: Self-pay | Admitting: Nurse Practitioner

## 2018-06-02 ENCOUNTER — Ambulatory Visit (INDEPENDENT_AMBULATORY_CARE_PROVIDER_SITE_OTHER): Payer: BLUE CROSS/BLUE SHIELD | Admitting: Nurse Practitioner

## 2018-06-02 VITALS — BP 124/94 | HR 81 | Temp 98.5°F | Ht 61.0 in | Wt 162.8 lb

## 2018-06-02 DIAGNOSIS — M2550 Pain in unspecified joint: Secondary | ICD-10-CM

## 2018-06-02 DIAGNOSIS — M2560 Stiffness of unspecified joint, not elsewhere classified: Secondary | ICD-10-CM

## 2018-06-02 DIAGNOSIS — E781 Pure hyperglyceridemia: Secondary | ICD-10-CM | POA: Diagnosis not present

## 2018-06-02 DIAGNOSIS — B351 Tinea unguium: Secondary | ICD-10-CM

## 2018-06-02 LAB — HEPATIC FUNCTION PANEL
ALBUMIN: 4.2 g/dL (ref 3.5–5.2)
ALK PHOS: 98 U/L (ref 39–117)
ALT: 17 U/L (ref 0–35)
AST: 20 U/L (ref 0–37)
BILIRUBIN DIRECT: 0 mg/dL (ref 0.0–0.3)
TOTAL PROTEIN: 7.3 g/dL (ref 6.0–8.3)
Total Bilirubin: 0.4 mg/dL (ref 0.2–1.2)

## 2018-06-02 LAB — BASIC METABOLIC PANEL
BUN: 16 mg/dL (ref 6–23)
CALCIUM: 9.5 mg/dL (ref 8.4–10.5)
CHLORIDE: 105 meq/L (ref 96–112)
CO2: 24 mEq/L (ref 19–32)
CREATININE: 0.7 mg/dL (ref 0.40–1.20)
GFR: 84.61 mL/min (ref >60.00)
Glucose, Bld: 84 mg/dL (ref 70–99)
Potassium: 3.7 mEq/L (ref 3.5–5.1)
Sodium: 137 mEq/L (ref 135–145)

## 2018-06-02 LAB — LIPID PANEL
CHOLESTEROL: 223 mg/dL — AB (ref 0–200)
HDL: 54.5 mg/dL (ref >39.00)
LDL Cholesterol: 129 mg/dL — ABNORMAL HIGH (ref 0–99)
NonHDL: 168.6
TRIGLYCERIDES: 199 mg/dL — AB (ref 0.0–149.0)
Total CHOL/HDL Ratio: 4
VLDL: 39.8 mg/dL (ref 0.0–40.0)

## 2018-06-02 LAB — SEDIMENTATION RATE: Sed Rate: 26 mm/hr (ref 0–30)

## 2018-06-02 MED ORDER — MULTI-VITAMIN/MINERALS PO TABS: 1.0000 | ORAL_TABLET | Freq: Every day | ORAL | Status: DC

## 2018-06-02 MED ORDER — MELOXICAM 7.5 MG PO TABS: 7.5000 mg | ORAL_TABLET | Freq: Every day | ORAL | 0 refills | Status: DC | PRN

## 2018-06-02 NOTE — Patient Instructions (Addendum)
Start multivitamin (nature made brand) 1tab once a day with food.  May alternate between meloxicam and tylenol for pain.  Do not take ibuprofen or naproxen while taking meloxicam.  Negative rheumatoid factor, and sed rate. normal renal and liver function.  Start lamisil tabs for toenail fungus. Treatment is for 12weeks. Only 30tabs sent for now. Return to lab for repeat hepatic panel in 51month prior to getting additional refills. Avoid excessive use of tylenol or ETOH while taking lamisil.  Persistent Abnormal lipid panel: elevated TC, LDL, and triglyceride. You have 7.6% chance of developing cardiovascular disease in the next 31yrs. Optimal number should be <7%. This mean it is important to change diet to DASH diet and have regular exercise. Need to repeat lipid panel in 1year (fasting)

## 2018-06-02 NOTE — Progress Notes (Signed)
Subjective:  Patient ID: Sheri Rice, female    DOB: 03-29-1956  Age: 63 y.o. MRN: [id number]  CC: Joint Swelling (Bilateral knee, shoulders, elbows and wrist pain, OTC tylenol , 3-6 mo)  HPI Sheri Rice present with generalized joint pain, stiffness and swelling. Ongoing for over 73months, knee pain has been worse in last 44months, associate with intermittent swelling. Has AM stiffness that last for about 5-28mins, stiffness improves with activity, then worse at bedtime again. LE swelling improves with elevation. Denies any FHx of RA, no injury, no fever, no joint erythema, no fatigue, no paresthesia, no weakness. Current job involves prolonged standing, repetitive bending and lifting.  She also complains of left great toenail, thickening, brittle and discoloration, onset over 70yr ago. No OTC used. She denies nay toenail injury  Reviewed past Medical, Social and Family history today.  Outpatient Medications Prior to Visit  Medication Sig Dispense Refill  . acetaminophen (TYLENOL) 325 MG tablet Take 2 tablets (650 mg total) by mouth every 6 (six) hours as needed. (Patient taking differently: Take 650 mg by mouth as needed. )    . cholecalciferol (VITAMIN D) 1000 units tablet Take 1 tablet (1,000 Units total) by mouth daily.    . ferrous sulfate 325 (65 FE) MG tablet Take 1 tablet (325 mg total) by mouth daily with breakfast.  3  . omeprazole (PRILOSEC) 20 MG capsule TAKE 1 CAPSULE BY MOUTH ONCE DAILY BEFORE BREAKFAST 90 capsule 1  . fenofibrate 54 MG tablet Take 1 tablet (54 mg total) by mouth daily. (Patient not taking: Reported on 06/02/2018) 30 tablet 2  . Omega-3 Fatty Acids (OMEGA-3 FISH OIL PO) Take by mouth daily.     Facility-Administered Medications Prior to Visit  Medication Dose Route Frequency Provider Last Rate Last Dose  . 0.9 %  sodium chloride infusion  500 mL Intravenous Once Irene Shipper, MD        ROS See HPI  Objective:  BP (!) 124/94   Pulse 81   Temp 98.5 F  (36.9 C) (Oral)   Ht 5\' 1"  (1.549 m)   Wt 162 lb 12.8 oz (73.8 kg)   SpO2 96%   BMI 30.76 kg/m   BP Readings from Last 3 Encounters:  06/02/18 (!) 124/94  06/29/17 102/60  05/08/17 110/78    Wt Readings from Last 3 Encounters:  06/02/18 162 lb 12.8 oz (73.8 kg)  06/29/17 160 lb (72.6 kg)  05/08/17 160 lb 8 oz (72.8 kg)    Physical Exam Vitals signs reviewed.  Cardiovascular:     Rate and Rhythm: Normal rate.     Pulses: Normal pulses.  Pulmonary:     Effort: Pulmonary effort is normal.  Musculoskeletal:        General: No swelling, tenderness or deformity.     Right lower leg: No edema.     Left lower leg: No edema.  Feet:     Right foot:     Skin integrity: Skin integrity normal.     Left foot:     Skin integrity: Skin integrity normal.     Toenail Condition: Left toenails are abnormally thick. Fungal disease present. Skin:    Findings: No erythema or rash.     Comments: Bilateral LE varicose veins  Neurological:     Mental Status: She is alert and oriented to person, place, and time.    Lab Results  Component Value Date   WBC 9.9 04/01/2017   HGB 12.8 04/01/2017   HCT  37.7 04/01/2017   PLT 269.0 04/01/2017   GLUCOSE 84 06/02/2018   CHOL 223 (H) 06/02/2018   TRIG 199.0 (H) 06/02/2018   HDL 54.50 06/02/2018   LDLDIRECT 112.0 04/01/2017   LDLCALC 129 (H) 06/02/2018   ALT 17 06/02/2018   AST 20 06/02/2018   NA 137 06/02/2018   K 3.7 06/02/2018   CL 105 06/02/2018   CREATININE 0.70 06/02/2018   BUN 16 06/02/2018   CO2 24 06/02/2018   TSH 2.97 04/01/2017   HGBA1C 5.4 03/03/2016    US Breast Ltd Uni Right Inc Axilla  Result Date: 06/11/2017 CLINICAL DATA:  63 year old patient was called back from screening mammogram for evaluation of a mass in the right breast. I reviewed the screening mammogram images today from June 02, 2017 and circles were placed in the medial periareolar right breast on both views, denoting a small mass. EXAM: ULTRASOUND OF THE  RIGHT BREAST COMPARISON:  Screening mammogram June 02, 2017 FINDINGS: On physical exam, no mass is palpated in the medial periareolar right breast. Targeted ultrasound is performed, showing 2 small oval cysts in the 3 o'clock position of the right breast 1 cm from the nipple. A 0.7 cm cyst with a few internal septations is present. A smaller 0.5 cm cyst with a few thin internal septations is seen in the adjacent tissue. No suspicious mass. IMPRESSION: Two benign subcentimeter cysts in the 3 o'clock position the right breast. No suspicious mass is seen in this region of the right breast. RECOMMENDATION: Screening mammogram in one year.(Code:SM-B-01Y) I have discussed the findings and recommendations with the patient. Results were also provided in writing at the conclusion of the visit. If applicable, a reminder letter will be sent to the patient regarding the next appointment. BI-RADS CATEGORY  2: Benign. Electronically Signed   By: Curlene Dolphin M.D.   On: 06/11/2017 12:57    Assessment & Plan:   Sheri Rice was seen today for joint swelling.  Diagnoses and all orders for this visit:  Joint stiffness -     Sedimentation rate -     Rheumatoid Factor -     meloxicam (MOBIC) 7.5 MG tablet; Take 1 tablet (7.5 mg total) by mouth daily as needed for pain. With food -     Basic metabolic panel  Onychomycosis -     Hepatic function panel -     terbinafine (LAMISIL) 250 MG tablet; Take 1 tablet (250 mg total) by mouth daily. -     Hepatic function panel; Future  Hypertriglyceridemia -     Lipid panel -     fenofibrate 54 MG tablet; Take 1 tablet (54 mg total) by mouth daily.  Arthralgia, unspecified joint -     Sedimentation rate -     Rheumatoid Factor -     meloxicam (MOBIC) 7.5 MG tablet; Take 1 tablet (7.5 mg total) by mouth daily as needed for pain. With food -     Basic metabolic panel  Other orders -     Multiple Vitamins-Minerals (MULTIVITAMIN WITH MINERALS) tablet; Take 1 tablet by mouth  daily. (Nature-Made Brand)   I have discontinued Sheri Rice's ferrous sulfate, cholecalciferol, Omega-3 Fatty Acids (OMEGA-3 FISH OIL PO), and omeprazole. I am also having her start on meloxicam, multivitamin with minerals, and terbinafine. Additionally, I am having her maintain her acetaminophen and fenofibrate. We will continue to administer sodium chloride.  Meds ordered this encounter  Medications  . meloxicam (MOBIC) 7.5 MG tablet    Sig:  Take 1 tablet (7.5 mg total) by mouth daily as needed for pain. With food    Dispense:  30 tablet    Refill:  0    Order Specific Question:   Supervising Provider    Answer:   Lucille Passy [[id number]]  . Multiple Vitamins-Minerals (MULTIVITAMIN WITH MINERALS) tablet    Sig: Take 1 tablet by mouth daily. (Nature-Made Brand)    Order Specific Question:   Supervising Provider    Answer:   Lucille Passy [[id number]]  . terbinafine (LAMISIL) 250 MG tablet    Sig: Take 1 tablet (250 mg total) by mouth daily.    Dispense:  30 tablet    Refill:  0    Order Specific Question:   Supervising Provider    Answer:   Lucille Passy [[id number]]  . fenofibrate 54 MG tablet    Sig: Take 1 tablet (54 mg total) by mouth daily.    Dispense:  90 tablet    Refill:  1    Order Specific Question:   Supervising Provider    Answer:   Lucille Passy [[id number]]    Problem List Items Addressed This Visit      Other   Hypertriglyceridemia   Relevant Medications   fenofibrate 54 MG tablet   Other Relevant Orders   Lipid panel (Completed)    Other Visit Diagnoses    Joint stiffness    -  Primary   Relevant Medications   meloxicam (MOBIC) 7.5 MG tablet   Other Relevant Orders   Sedimentation rate (Completed)   Rheumatoid Factor (Completed)   Basic metabolic panel (Completed)   Onychomycosis       Relevant Medications   terbinafine (LAMISIL) 250 MG tablet   Other Relevant Orders   Hepatic function panel (Completed)   Hepatic function panel   Arthralgia, unspecified joint        Relevant Medications   meloxicam (MOBIC) 7.5 MG tablet   Other Relevant Orders   Sedimentation rate (Completed)   Rheumatoid Factor (Completed)   Basic metabolic panel (Completed)       Follow-up: Return in about 6 months (around 12/03/2018) for CPE(fasting).  Wilfred Lacy, NP

## 2018-06-03 ENCOUNTER — Encounter: Payer: Self-pay | Admitting: Nurse Practitioner

## 2018-06-03 LAB — RHEUMATOID FACTOR: Rhuematoid fact SerPl-aCnc: <14 IU/mL (ref <14)

## 2018-06-03 MED ORDER — FENOFIBRATE 54 MG PO TABS: 54.0000 mg | ORAL_TABLET | Freq: Every day | ORAL | 1 refills | Status: DC

## 2018-06-03 MED ORDER — TERBINAFINE HCL 250 MG PO TABS: 250.0000 mg | ORAL_TABLET | Freq: Every day | ORAL | 0 refills | Status: DC

## 2018-06-07 ENCOUNTER — Encounter: Payer: Self-pay | Admitting: Nurse Practitioner

## 2018-11-08 ENCOUNTER — Telehealth: Payer: Self-pay | Admitting: Behavioral Health

## 2018-11-08 NOTE — Telephone Encounter (Signed)

## 2018-11-09 ENCOUNTER — Ambulatory Visit (INDEPENDENT_AMBULATORY_CARE_PROVIDER_SITE_OTHER): Payer: BC Managed Care – PPO | Admitting: Nurse Practitioner

## 2018-11-09 ENCOUNTER — Encounter: Payer: Self-pay | Admitting: Nurse Practitioner

## 2018-11-09 ENCOUNTER — Other Ambulatory Visit: Payer: Self-pay

## 2018-11-09 VITALS — BP 114/78 | HR 81 | Temp 97.8°F | Ht 61.0 in | Wt 162.4 lb

## 2018-11-09 DIAGNOSIS — M2550 Pain in unspecified joint: Secondary | ICD-10-CM

## 2018-11-09 DIAGNOSIS — E781 Pure hyperglyceridemia: Secondary | ICD-10-CM | POA: Diagnosis not present

## 2018-11-09 DIAGNOSIS — R252 Cramp and spasm: Secondary | ICD-10-CM | POA: Diagnosis not present

## 2018-11-09 DIAGNOSIS — B351 Tinea unguium: Secondary | ICD-10-CM

## 2018-11-09 LAB — HEPATIC FUNCTION PANEL
ALT: 17 U/L (ref 0–35)
AST: 18 U/L (ref 0–37)
Albumin: 4.3 g/dL (ref 3.5–5.2)
Alkaline Phosphatase: 81 U/L (ref 39–117)
Bilirubin, Direct: 0.1 mg/dL (ref 0.0–0.3)
Total Bilirubin: 0.3 mg/dL (ref 0.2–1.2)
Total Protein: 6.9 g/dL (ref 6.0–8.3)

## 2018-11-09 LAB — LIPID PANEL
Cholesterol: 205 mg/dL — ABNORMAL HIGH (ref 0–200)
HDL: 60.2 mg/dL (ref >39.00)
LDL Cholesterol: 107 mg/dL — ABNORMAL HIGH (ref 0–99)
NonHDL: 145.21
Total CHOL/HDL Ratio: 3
Triglycerides: 189 mg/dL — ABNORMAL HIGH (ref 0.0–149.0)
VLDL: 37.8 mg/dL (ref 0.0–40.0)

## 2018-11-09 LAB — BASIC METABOLIC PANEL
BUN: 21 mg/dL (ref 6–23)
CO2: 29 mEq/L (ref 19–32)
Calcium: 9.8 mg/dL (ref 8.4–10.5)
Chloride: 103 mEq/L (ref 96–112)
Creatinine, Ser: 0.81 mg/dL (ref 0.40–1.20)
GFR: 71.39 mL/min (ref >60.00)
Glucose, Bld: 90 mg/dL (ref 70–99)
Potassium: 4.7 mEq/L (ref 3.5–5.1)
Sodium: 139 mEq/L (ref 135–145)

## 2018-11-09 LAB — CK: Total CK: 92 U/L (ref 7–177)

## 2018-11-09 MED ORDER — MELOXICAM 7.5 MG PO TABS: 7.5000 mg | ORAL_TABLET | Freq: Every day | ORAL | 1 refills | Status: DC | PRN

## 2018-11-09 NOTE — Progress Notes (Signed)
Subjective:  Patient ID: Sheri Rice, female    DOB: 07/11/1955  Age: 63 y.o. MRN: [id number]  CC: Follow-up (follow up on pain--still in pain--wrose at night)  Leg Pain  The incident occurred more than 1 week ago. There was no injury mechanism. The pain is present in the left leg, left hip, left knee, left foot, right leg, right hip, right knee and right foot. The quality of the pain is described as aching and cramping. The pain has been intermittent since onset. Pertinent negatives include no inability to bear weight, loss of motion, loss of sensation, muscle weakness, numbness or tingling. The symptoms are aggravated by palpation and movement. She has tried NSAIDs for the symptoms. The treatment provided significant relief.   Left Great Toenail thickening and discoloration: No change Use of lamisil x 30days in March, did not take refill.  Reviewed past Medical, Social and Family history today.  Outpatient Medications Prior to Visit  Medication Sig Dispense Refill  . acetaminophen (TYLENOL) 325 MG tablet Take 2 tablets (650 mg total) by mouth every 6 (six) hours as needed. (Patient taking differently: Take 650 mg by mouth as needed. )    . Multiple Vitamins-Minerals (MULTIVITAMIN WITH MINERALS) tablet Take 1 tablet by mouth daily. (Nature-Made Brand)    . fenofibrate 54 MG tablet Take 1 tablet (54 mg total) by mouth daily. 90 tablet 1  . meloxicam (MOBIC) 7.5 MG tablet Take 1 tablet (7.5 mg total) by mouth daily as needed for pain. With food (Patient not taking: Reported on 11/09/2018) 30 tablet 0  . terbinafine (LAMISIL) 250 MG tablet Take 1 tablet (250 mg total) by mouth daily. (Patient not taking: Reported on 11/09/2018) 30 tablet 0   Facility-Administered Medications Prior to Visit  Medication Dose Route Frequency Provider Last Rate Last Dose  . 0.9 %  sodium chloride infusion  500 mL Intravenous Once Irene Shipper, MD        ROS See HPI  Objective:  BP 114/78   Pulse 81    Temp 97.8 F (36.6 C) (Tympanic)   Ht 5\' 1"  (1.549 m)   Wt 162 lb 6.4 oz (73.7 kg)   SpO2 97%   BMI 30.69 kg/m   BP Readings from Last 3 Encounters:  11/09/18 114/78  06/02/18 (!) 124/94  06/29/17 102/60    Wt Readings from Last 3 Encounters:  11/09/18 162 lb 6.4 oz (73.7 kg)  06/02/18 162 lb 12.8 oz (73.8 kg)  06/29/17 160 lb (72.6 kg)    Physical Exam Vitals signs reviewed.  Cardiovascular:     Rate and Rhythm: Normal rate.     Pulses: Normal pulses.  Pulmonary:     Effort: Pulmonary effort is normal.  Musculoskeletal: Normal range of motion.        General: Tenderness present. No swelling or deformity.     Right lower leg: No edema.     Left lower leg: No edema.  Skin:    General: Skin is warm and dry.     Findings: No erythema.  Neurological:     Mental Status: She is alert and oriented to person, place, and time.     Lab Results  Component Value Date   WBC 9.9 04/01/2017   HGB 12.8 04/01/2017   HCT 37.7 04/01/2017   PLT 269.0 04/01/2017   GLUCOSE 90 11/09/2018   CHOL 205 (H) 11/09/2018   TRIG 189.0 (H) 11/09/2018   HDL 60.20 11/09/2018   LDLDIRECT 112.0 04/01/2017  LDLCALC 107 (H) 11/09/2018   ALT 17 11/09/2018   AST 18 11/09/2018   NA 139 11/09/2018   K 4.7 11/09/2018   CL 103 11/09/2018   CREATININE 0.81 11/09/2018   BUN 21 11/09/2018   CO2 29 11/09/2018   TSH 2.97 04/01/2017   HGBA1C 5.4 03/03/2016    US Breast Ltd Uni Right Inc Axilla  Result Date: 06/11/2017 CLINICAL DATA:  63 year old patient was called back from screening mammogram for evaluation of a mass in the right breast. I reviewed the screening mammogram images today from June 02, 2017 and circles were placed in the medial periareolar right breast on both views, denoting a small mass. EXAM: ULTRASOUND OF THE RIGHT BREAST COMPARISON:  Screening mammogram June 02, 2017 FINDINGS: On physical exam, no mass is palpated in the medial periareolar right breast. Targeted ultrasound is  performed, showing 2 small oval cysts in the 3 o'clock position of the right breast 1 cm from the nipple. A 0.7 cm cyst with a few internal septations is present. A smaller 0.5 cm cyst with a few thin internal septations is seen in the adjacent tissue. No suspicious mass. IMPRESSION: Two benign subcentimeter cysts in the 3 o'clock position the right breast. No suspicious mass is seen in this region of the right breast. RECOMMENDATION: Screening mammogram in one year.(Code:SM-B-01Y) I have discussed the findings and recommendations with the patient. Results were also provided in writing at the conclusion of the visit. If applicable, a reminder letter will be sent to the patient regarding the next appointment. BI-RADS CATEGORY  2: Benign. Electronically Signed   By: Curlene Dolphin M.D.   On: 06/11/2017 12:57    Assessment & Plan:   Sheri Rice was seen today for follow-up.  Diagnoses and all orders for this visit:  Arthralgia, unspecified joint -     CK -     meloxicam (MOBIC) 7.5 MG tablet; Take 1 tablet (7.5 mg total) by mouth daily as needed for pain. With food -     Basic metabolic panel  Hypertriglyceridemia -     Hepatic function panel -     Lipid panel -     fenofibrate 120 MG TABS; Take 1 tablet (120 mg total) by mouth daily.  Muscle cramps at night -     meloxicam (MOBIC) 7.5 MG tablet; Take 1 tablet (7.5 mg total) by mouth daily as needed for pain. With food  Onychomycosis -     terbinafine (LAMISIL) 250 MG tablet; Take 1 tablet (250 mg total) by mouth daily.   I have changed Sheri Rice's Fenofibrate. I am also having her maintain her acetaminophen, multivitamin with minerals, meloxicam, and terbinafine. We will continue to administer sodium chloride.  Meds ordered this encounter  Medications  . meloxicam (MOBIC) 7.5 MG tablet    Sig: Take 1 tablet (7.5 mg total) by mouth daily as needed for pain. With food    Dispense:  90 tablet    Refill:  1    Order Specific Question:    Supervising Provider    Answer:   Lucille Passy [[id number]]  . fenofibrate 120 MG TABS    Sig: Take 1 tablet (120 mg total) by mouth daily.    Dispense:  90 tablet    Refill:  1    Order Specific Question:   Supervising Provider    Answer:   Lucille Passy [[id number]]  . terbinafine (LAMISIL) 250 MG tablet    Sig: Take 1 tablet (250 mg  total) by mouth daily.    Dispense:  30 tablet    Refill:  0    Order Specific Question:   Supervising Provider    Answer:   Lucille Passy [[id number]]    Problem List Items Addressed This Visit      Other   Hypertriglyceridemia   Relevant Medications   fenofibrate 120 MG TABS   Other Relevant Orders   Hepatic function panel (Completed)   Lipid panel (Completed)    Other Visit Diagnoses    Arthralgia, unspecified joint    -  Primary   Relevant Medications   meloxicam (MOBIC) 7.5 MG tablet   Other Relevant Orders   CK (Completed)   Basic metabolic panel (Completed)   Muscle cramps at night       Relevant Medications   meloxicam (MOBIC) 7.5 MG tablet   Onychomycosis       Relevant Medications   terbinafine (LAMISIL) 250 MG tablet       Follow-up: Return in about 3 months (around 02/09/2019) for CPE (fasting).  Wilfred Lacy, NP

## 2018-11-09 NOTE — Patient Instructions (Addendum)
Go to lab for blood draw.  Resume meloxicam as needed for joint pain.  Normal hepatic, renal, and CK. Abnormal lipid panel: increase fenofibrate dose to 120mg . New rx sent. F/up in 59months as discussed (fasting)

## 2018-11-10 ENCOUNTER — Encounter: Payer: Self-pay | Admitting: Nurse Practitioner

## 2018-11-10 MED ORDER — FENOFIBRATE 120 MG PO TABS: 120.0000 mg | ORAL_TABLET | Freq: Every day | ORAL | 1 refills | Status: DC

## 2018-11-10 MED ORDER — TERBINAFINE HCL 250 MG PO TABS: 250.0000 mg | ORAL_TABLET | Freq: Every day | ORAL | 0 refills | Status: DC

## 2019-01-17 ENCOUNTER — Other Ambulatory Visit: Payer: Self-pay | Admitting: Nurse Practitioner

## 2019-01-17 DIAGNOSIS — E781 Pure hyperglyceridemia: Secondary | ICD-10-CM

## 2019-01-18 ENCOUNTER — Other Ambulatory Visit: Payer: Self-pay

## 2019-01-18 DIAGNOSIS — Z20822 Contact with and (suspected) exposure to covid-19: Secondary | ICD-10-CM

## 2019-01-20 LAB — NOVEL CORONAVIRUS, NAA: SARS-CoV-2, NAA: NOT DETECTED

## 2019-01-21 ENCOUNTER — Other Ambulatory Visit: Payer: Self-pay | Admitting: Nurse Practitioner

## 2019-01-21 DIAGNOSIS — E781 Pure hyperglyceridemia: Secondary | ICD-10-CM

## 2019-02-03 ENCOUNTER — Other Ambulatory Visit: Payer: Self-pay | Admitting: Nurse Practitioner

## 2019-02-03 DIAGNOSIS — E781 Pure hyperglyceridemia: Secondary | ICD-10-CM

## 2019-02-08 ENCOUNTER — Other Ambulatory Visit: Payer: Self-pay | Admitting: Nurse Practitioner

## 2019-02-08 DIAGNOSIS — E781 Pure hyperglyceridemia: Secondary | ICD-10-CM

## 2019-02-09 ENCOUNTER — Encounter: Payer: BC Managed Care – PPO | Admitting: Nurse Practitioner

## 2019-03-01 ENCOUNTER — Encounter: Payer: BC Managed Care – PPO | Admitting: Nurse Practitioner

## 2019-03-16 ENCOUNTER — Encounter: Payer: Self-pay | Admitting: Nurse Practitioner

## 2019-03-16 ENCOUNTER — Ambulatory Visit (INDEPENDENT_AMBULATORY_CARE_PROVIDER_SITE_OTHER): Payer: BC Managed Care – PPO | Admitting: Nurse Practitioner

## 2019-03-16 ENCOUNTER — Other Ambulatory Visit: Payer: Self-pay

## 2019-03-16 VITALS — BP 124/87 | HR 66 | Temp 96.8°F | Ht 60.63 in | Wt 162.4 lb

## 2019-03-16 DIAGNOSIS — Z23 Encounter for immunization: Secondary | ICD-10-CM

## 2019-03-16 DIAGNOSIS — E782 Mixed hyperlipidemia: Secondary | ICD-10-CM

## 2019-03-16 DIAGNOSIS — R498 Other voice and resonance disorders: Secondary | ICD-10-CM

## 2019-03-16 DIAGNOSIS — M170 Bilateral primary osteoarthritis of knee: Secondary | ICD-10-CM

## 2019-03-16 DIAGNOSIS — Z0001 Encounter for general adult medical examination with abnormal findings: Secondary | ICD-10-CM | POA: Diagnosis not present

## 2019-03-16 LAB — CBC WITH DIFFERENTIAL/PLATELET
Basophils Absolute: 0 10*3/uL (ref 0.0–0.1)
Basophils Relative: 0.4 % (ref 0.0–3.0)
Eosinophils Absolute: 0.1 10*3/uL (ref 0.0–0.7)
Eosinophils Relative: 1.2 % (ref 0.0–5.0)
HCT: 35.9 % — ABNORMAL LOW (ref 36.0–46.0)
Hemoglobin: 11.9 g/dL — ABNORMAL LOW (ref 12.0–15.0)
Lymphocytes Relative: 33.4 % (ref 12.0–46.0)
Lymphs Abs: 2.5 10*3/uL (ref 0.7–4.0)
MCHC: 33.2 g/dL (ref 30.0–36.0)
MCV: 91.4 fl (ref 78.0–100.0)
Monocytes Absolute: 0.5 10*3/uL (ref 0.1–1.0)
Monocytes Relative: 6.5 % (ref 3.0–12.0)
Neutro Abs: 4.4 10*3/uL (ref 1.4–7.7)
Neutrophils Relative %: 58.5 % (ref 43.0–77.0)
Platelets: 300 10*3/uL (ref 150.0–400.0)
RBC: 3.93 Mil/uL (ref 3.87–5.11)
RDW: 12.4 % (ref 11.5–15.5)
WBC: 7.6 10*3/uL (ref 4.0–10.5)

## 2019-03-16 LAB — COMPREHENSIVE METABOLIC PANEL
ALT: 19 U/L (ref 0–35)
AST: 22 U/L (ref 0–37)
Albumin: 4.4 g/dL (ref 3.5–5.2)
Alkaline Phosphatase: 88 U/L (ref 39–117)
BUN: 18 mg/dL (ref 6–23)
CO2: 31 mEq/L (ref 19–32)
Calcium: 9.5 mg/dL (ref 8.4–10.5)
Chloride: 103 mEq/L (ref 96–112)
Creatinine, Ser: 0.72 mg/dL (ref 0.40–1.20)
GFR: 81.7 mL/min (ref >60.00)
Glucose, Bld: 94 mg/dL (ref 70–99)
Potassium: 4 mEq/L (ref 3.5–5.1)
Sodium: 140 mEq/L (ref 135–145)
Total Bilirubin: 0.5 mg/dL (ref 0.2–1.2)
Total Protein: 7.1 g/dL (ref 6.0–8.3)

## 2019-03-16 LAB — TSH: TSH: 3.28 u[IU]/mL (ref 0.35–4.50)

## 2019-03-16 MED ORDER — PRAVASTATIN SODIUM 40 MG PO TABS: 40.0000 mg | ORAL_TABLET | Freq: Every day | ORAL | 3 refills | Status: DC

## 2019-03-16 MED ORDER — DICLOFENAC SODIUM 1 % EX GEL: 2.0000 g | Freq: Four times a day (QID) | CUTANEOUS | 1 refills | Status: DC

## 2019-03-16 MED ORDER — OMEPRAZOLE 20 MG PO CPDR: 20.0000 mg | DELAYED_RELEASE_CAPSULE | Freq: Two times a day (BID) | ORAL | 1 refills | Status: DC

## 2019-03-16 NOTE — Progress Notes (Signed)
Subjective:    Patient ID: Sheri Rice, female    DOB: 11-27-1955, 63 y.o.   MRN: [id number]  Patient presents today for complete physical and f/up on chronic conditions.   Knee Pain  The incident occurred more than 1 week ago. There was no injury mechanism. The pain is present in the left knee and right knee (L>R). The quality of the pain is described as aching. The pain is moderate. The pain has been intermittent since onset. Pertinent negatives include no inability to bear weight, loss of motion, loss of sensation, muscle weakness or tingling. She reports no foreign bodies present. The symptoms are aggravated by weight bearing. She has tried nothing for the symptoms.  Gastroesophageal Reflux She complains of choking, dysphagia, globus sensation, heartburn and a hoarse voice. She reports no abdominal pain, no belching, no chest pain, no coughing, no early satiety, no nausea, no sore throat, no stridor, no tooth decay, no water brash or no wheezing. choking on liquids only. This is a chronic problem. The current episode started more than 1 year ago. The problem occurs frequently. The problem has been unchanged. The heartburn is located in the abdomen and substernum. The heartburn is of mild intensity. The heartburn does not wake her from sleep. The heartburn does not limit her activity. The heartburn doesn't change with position. The symptoms are aggravated by lying down. Pertinent negatives include no anemia, fatigue, melena, muscle weakness or weight loss. Risk factors include lack of exercise, NSAIDs, obesity and caffeine use. She has tried a PPI for the symptoms. The treatment provided mild relief. Past procedures do not include an abdominal ultrasound, an EGD, esophageal manometry, esophageal pH monitoring, H. pylori antibody titer or a UGI.  reports left knee instability, denies any previous knee injury also complains of intermittent right foot pain, describes at sharp, last for few minutes  then resolves spontaneous. Limited use of NSAIDs due to GERD symptoms  Sexual History (orientation,birth control, marital status, STD):married, sexually active, up to date with PAP and mammogram  Depression/Suicide: Depression screen [facility] 2/9 03/16/2019 11/09/2018 04/01/2017  Decreased Interest 0 0 0  Down, Depressed, Hopeless 1 0 0  PHQ - 2 Score 1 0 0   Vision:up to date  Dental:will schedule  Immunizations: (TDAP, Hep C screen, Pneumovax, Influenza, zoster)  Health Maintenance  Topic Date Due  . Mammogram  06/03/2019  . Pap Smear  04/01/2020  . Tetanus Vaccine  04/02/2027  . Colon Cancer Screening  06/30/2027  . Flu Shot  Completed  .  Hepatitis C: One time screening is recommended by Center for Disease Control  (CDC) for  adults born from [birth year] through 1965.   Completed  . HIV Screening  Completed   Diet:regular.  Weight:  Wt Readings from Last 3 Encounters:  03/16/19 162 lb 6.4 oz (73.7 kg)  11/09/18 162 lb 6.4 oz (73.7 kg)  06/02/18 162 lb 12.8 oz (73.8 kg)    Exercise:none  Fall Risk: Fall Risk  03/16/2019 11/09/2018 11/09/2018 04/01/2017  Falls in the past year? 0 1 0 No  Number falls in past yr: - 0 - -  Injury with Fall? - 1 - -  Comment - right hand painful - -   Medications and allergies reviewed with patient and updated if appropriate.  Patient Active Problem List   Diagnosis Date Noted  . Globus hystericus 04/06/2017  . Hypertriglyceridemia 04/06/2017  . Gastroesophageal reflux disease 02/23/2017  . Weakness 03/03/2016  . Herpes zoster 09/25/2015  .  Urinary urgency 04/18/2015  . DDD (degenerative disc disease), cervical 03/16/2015  . Osteoarthritis of both knees 03/16/2015  . COUGH 03/18/2010  . HYPERLIPIDEMIA, MIXED, MILD 05/28/2007  . ANAL OR RECTAL PAIN 05/28/2007  . URINARY INCONTINENCE, MIXED 02/05/2007  . RESTLESS LEG SYNDROME 12/08/2006  . CHEST PAIN, ATYPICAL 12/08/2006  . SYMPTOM, PAIN, ABDOMINAL, GENERALIZED 12/08/2006  . DYSMENORRHEA  12/05/2006  . HEADACHE 12/05/2006  . Allergic rhinitis 12/04/2006  . PLANTAR FASCIITIS, BILATERAL 10/05/2006  . HOARSENESS 07/28/2006  . FIBROIDS, UTERUS 03/31/2001    Current Outpatient Medications on File Prior to Visit  Medication Sig Dispense Refill  . acetaminophen (TYLENOL) 325 MG tablet Take 2 tablets (650 mg total) by mouth every 6 (six) hours as needed. (Patient taking differently: Take 650 mg by mouth as needed. )    . Multiple Vitamins-Minerals (MULTIVITAMIN WITH MINERALS) tablet Take 1 tablet by mouth daily. (Nature-Made Brand)     Current Facility-Administered Medications on File Prior to Visit  Medication Dose Route Frequency Provider Last Rate Last Admin  . 0.9 %  sodium chloride infusion  500 mL Intravenous Once Irene Shipper, MD        Past Medical History:  Diagnosis Date  . GERD (gastroesophageal reflux disease)     Past Surgical History:  Procedure Laterality Date  . DILATION AND CURETTAGE OF UTERUS      Social History   Socioeconomic History  . Marital status: Married    Spouse name: Not on file  . Number of children: 3  . Years of education: 70  . Highest education level: Not on file  Occupational History  . 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   Tobacco Use  . Smoking status: Never Smoker  . Smokeless tobacco: Never Used  Substance and Sexual Activity  . Alcohol use: No    Alcohol/week: 0.0 standard drinks  . Drug use: No  . Sexual activity: Yes    Birth control/protection: Post-menopausal  Other Topics Concern  . Not on file  Social History Narrative  . Not on file   Social Determinants of Health   Financial Resource Strain:   . Difficulty of Paying Living Expenses: Not on file  Food Insecurity:   . Worried About Charity fundraiser in the Last Year: Not on file  . Ran Out of Food in the Last Year: Not on file  Transportation Needs:   . Lack of Transportation (Medical): Not on file  . Lack of Transportation (Non-Medical): Not on file  Physical  Activity:   . Days of Exercise per Week: Not on file  . Minutes of Exercise per Session: Not on file  Stress:   . Feeling of Stress : Not on file  Social Connections:   . Frequency of Communication with Friends and Family: Not on file  . Frequency of Social Gatherings with Friends and Family: Not on file  . Attends Religious Services: Not on file  . Active Member of Clubs or Organizations: Not on file  . Attends Archivist Meetings: Not on file  . Marital Status: Not on file   Family History  Problem Relation Age of Onset  . Diabetes Mother   . Depression Mother   . Stroke Maternal Grandmother   . Colon cancer Neg Hx   . Liver cancer Neg Hx        Review of Systems  Constitutional: Negative for fatigue, fever, malaise/fatigue and weight loss.  HENT: Positive for hoarse voice. Negative for congestion and sore throat.  Eyes:       Negative for visual changes  Respiratory: Positive for choking. Negative for cough, shortness of breath and wheezing.   Cardiovascular: Negative for chest pain, palpitations and leg swelling.  Gastrointestinal: Positive for dysphagia and heartburn. Negative for abdominal pain, blood in stool, constipation, diarrhea, melena and nausea.  Genitourinary: Negative for dysuria, frequency and urgency.  Musculoskeletal: Positive for joint pain. Negative for back pain, falls, myalgias and muscle weakness.  Skin: Negative for rash.  Neurological: Negative for dizziness, tingling, sensory change, focal weakness, weakness and headaches.  Endo/Heme/Allergies: Does not bruise/bleed easily.  Psychiatric/Behavioral: Negative for depression, substance abuse and suicidal ideas. The patient is not nervous/anxious and does not have insomnia.    Objective:   Vitals:   03/16/19 1109  BP: 124/87  Pulse: 66  Temp: (!) 96.8 F (36 C)  SpO2: 97%    Body mass index is 31.06 kg/m.   Physical Examination:  Physical Exam Vitals reviewed.  Constitutional:       Appearance: She is obese.  HENT:     Right Ear: Tympanic membrane, ear canal and external ear normal.     Left Ear: Tympanic membrane, ear canal and external ear normal.  Eyes:     Extraocular Movements: Extraocular movements intact.     Conjunctiva/sclera: Conjunctivae normal.  Cardiovascular:     Rate and Rhythm: Normal rate and regular rhythm.     Pulses: Normal pulses.     Heart sounds: Normal heart sounds.  Pulmonary:     Effort: Pulmonary effort is normal.     Breath sounds: Normal breath sounds.  Abdominal:     General: Bowel sounds are normal.     Palpations: Abdomen is soft.     Tenderness: There is no abdominal tenderness. There is no guarding.  Genitourinary:    Comments: Declined pelvic and breast exam today Musculoskeletal:        General: No swelling or tenderness.     Cervical back: Normal range of motion and neck supple.     Right knee: Normal.     Left knee: Normal.     Right lower leg: No edema.     Left lower leg: No edema.     Right foot: Normal range of motion. Bunion present. No swelling, bony tenderness or crepitus. Normal pulse.     Left foot: Normal range of motion. No swelling, bunion, bony tenderness or crepitus. Normal pulse.  Lymphadenopathy:     Cervical: No cervical adenopathy.  Skin:    General: Skin is warm and dry.     Findings: No erythema.  Neurological:     General: No focal deficit present.     Mental Status: She is alert and oriented to person, place, and time.  Psychiatric:        Mood and Affect: Mood normal.        Behavior: Behavior normal.        Thought Content: Thought content normal.    ASSESSMENT and PLAN: This visit occurred during the SARS-CoV-2 public health emergency.  Safety protocols were in place, including screening questions prior to the visit, additional usage of staff PPE, and extensive cleaning of exam room while observing appropriate contact time as indicated for disinfecting solutions.   Sheri Rice was  seen today for annual exam.  Diagnoses and all orders for this visit:  Encounter for preventative adult health care exam with abnormal findings -     CBC with Differential/Platelet -  CMP -     TSH  HYPERLIPIDEMIA, MIXED, MILD -     pravastatin (PRAVACHOL) 40 MG tablet; Take 1 tablet (40 mg total) by mouth daily.  Primary osteoarthritis of both knees -     diclofenac Sodium (VOLTAREN) 1 % GEL; Apply 2 g topically 4 (four) times daily.  HOARSENESS -     Ambulatory referral to ENT -     omeprazole (PRILOSEC) 20 MG capsule; Take 1 capsule (20 mg total) by mouth 2 (two) times daily before a meal.  Need for influenza vaccination -     Flu Vaccine QUAD 6+ mos PF IM (Fluarix Quad PF)    No problem-specific Assessment & Plan notes found for this encounter.      Problem List Items Addressed This Visit      Musculoskeletal and Integument   Osteoarthritis of both knees   Relevant Medications   diclofenac Sodium (VOLTAREN) 1 % GEL     Other   HOARSENESS   Relevant Medications   omeprazole (PRILOSEC) 20 MG capsule   Other Relevant Orders   Ambulatory referral to ENT   HYPERLIPIDEMIA, MIXED, MILD   Relevant Medications   pravastatin (PRAVACHOL) 40 MG tablet    Other Visit Diagnoses    Encounter for preventative adult health care exam with abnormal findings    -  Primary   Relevant Orders   CBC with Differential/Platelet   CMP   TSH   Need for influenza vaccination       Relevant Orders   Flu Vaccine QUAD 6+ mos PF IM (Fluarix Quad PF) (Completed)      Follow up: Return in about 6 months (around 09/14/2019) for hyperlipidemia (fasting, video or F2F).  Wilfred Lacy, NP

## 2019-03-16 NOTE — Patient Instructions (Addendum)
Start pravastatin 40mg . Ok to continue omega fish oil OTC supplement. Maintain DASH diet.  Start leg exercise daily for next 6weeks. Use voltaren gel 3-4x/days Use knee brace daily. If no improvement in 6weeks, you will need referral to sports medicine.  Schedule appt for dental cleaning every 77months.  Go to lab got blood draw.  Journal for Nurse Practitioners, 15(4), [phone]. Retrieved January 04, 2018 from http://clinicalkey.com/nursing">  Ejercicios para las rodillas Knee Exercises Pregunte al mdico qu ejercicios son seguros para usted. Haga los ejercicios exactamente como se lo haya indicado el mdico y gradelos como se lo hayan indicado. Es normal sentir un estiramiento leve, tironeo, opresin o Tree surgeon al [employer]. Detngase de inmediato si siente un dolor repentino o Printmaker. No comience a hacer estos ejercicios hasta que se lo indique el mdico. Ejercicios de elongacin y amplitud de movimiento Estos ejercicios calientan los msculos y las articulaciones, y mejoran la movilidad y la flexibilidad de la rodilla. Adems, ayudan a Best boy y la hinchazn. Extensin de la rodilla, en decbito prono 1. Recustese boca abajo (posicin prona) sobre una cama. 2. Coloque la rodilla izquierda/derecha fuera de dicha superficie para no apoyarla. Puede colocar una toalla debajo del muslo izquierdo/derecho, justo sobre la [place], para mayor comodidad. 3. Relaje los msculos de la pierna, dejando que la ley de gravedad extienda la rodilla (extensin). Debe sentir un estiramiento detrs de la rodilla izquierda/derecha. 4. Mantenga esta posicin durante __________ segundos. 5. Muvase un poco hacia adelante para apoyar la rodilla entre repeticiones. Repita __________ veces. Realice este ejercicio __________ veces al da. Flexin de la rodilla, activo  1. Acustese boca arriba con las piernas extendidas. Si esto le ocasiona molestias en la espalda, flexione la  rodilla izquierda/derecha y coloque el pie sobre el suelo. 2. Lentamente, acerque el taln izquierdo/derecho [employer]. Detngase cuando sienta un leve estiramiento en la parte de adelante de la rodilla o del muslo (flexin). 3. Mantenga esta posicin durante __________ segundos. 4. Deslice lentamente el taln izquierdo/derecho hasta la posicin inicial. Repita __________ veces. Realice este ejercicio __________ veces al da. Estiramiento de cudriceps, decbito prono  1. Recustese boca abajo en una superficie firme, como una cama o un suelo acolchonado. 2. Flexione la rodilla izquierda/derecha y tmese del tobillo. Si no puede llegar al Ardis Hughs o al pantaln, tese un cinturn alrededor del pie y agrrelo en Chief of Staff. 3. Acerque lentamente el taln a las nalgas. La rodilla no deber [employer]. Debe sentir un estiramiento en la parte de adelante del muslo y la rodilla (cudriceps). 4. Mantenga esta posicin durante __________ segundos. Repita __________ veces. Realice este ejercicio __________ veces al da. Isquiotibiales, en decbito supino 1. Acustese boca arriba (posicin supina). 2. Ate un cinturn o una toalla en la regin metatarsiana del pie izquierdo/derecho. La regin metatarsiana del pie es la superficie sobre la que caminamos, justo debajo de los dedos. 3. Estire la rodilla izquierda/derecha y, lentamente, tire del cinturn para elevar la pierna, hasta que sienta un leve estiramiento detrs de la rodilla (isquiotibiales). ? No flexione la rodilla mientras realiza este ejercicio. ? Mantenga la otra pierna estirada contra el suelo. 4. Mantenga esta posicin durante __________ segundos. Repita __________ veces. Realice este ejercicio __________ veces al da. Ejercicios de fortalecimiento Estos ejercicios fortalecen la rodilla y Building control surveyor resistencia. La resistencia es la capacidad de usar los msculos durante un tiempo prolongado, incluso despus de que se  cansen. Cudriceps, ejercicio isomtrico En este ejercicio  se estiran los msculos de la parte de adelante del muslo (cudriceps) sin mover la articulacin de la rodilla (isomtrico). 1. Recustese boca arriba con la pierna izquierda/derecha extendida y la rodilla opuesta flexionada. Coloque una toalla enrollada o un almohadn pequeo debajo de la rodilla, segn las indicaciones del mdico. 2. Tensione lentamente los msculos de la parte de adelante del muslo izquierdo/derecho. Deber ver la rtula que se desliza para arriba hacia la cadera o que se profundizan los hoyuelos justo por encima de la rodilla. Este [employer] la parte posterior de rodilla hacia el suelo. 3. Mantenga el msculo tan apretado como pueda sin aumentar el dolor durante __________ segundos. 4. Relaje los msculos lentamente y por completo. Repita __________ veces. Realice este ejercicio __________ veces al da. Elevaciones con la pierna extendida Este ejercicio estira los msculos de la parte de adelante del muslo (cudriceps) y los msculos que permiten mover las caderas (flexores de cadera). 1. Recustese boca arriba con la pierna izquierda/derecha extendida y la rodilla opuesta flexionada. 2. Tensione los msculos de la parte de adelante del muslo izquierdo/derecho. Deber ver la rtula de la rodilla que se desliza hacia arriba o que se profundizan los hoyuelos justo por encima de la rodilla. El muslo podr sacudirse un poco. 3. Mantenga estos msculos contrados mientras levanta la pierna a una altura de 4 a 6pulgadas (10 a 15cm) del suelo. No deje que la rodilla se flexione. 4. Mantenga esta posicin durante __________ segundos. 5. Mantenga estos msculos contrados mientras baja la pierna. 6. Deje que sus msculos se relajen completamente despus de cada repeticin. Repita __________ veces. Realice este ejercicio __________ veces al da. Isquiotibiales, isometra 1. Acustese boca arriba sobre una superficie  firme. 2. Flexione la rodilla izquierda/derecha aproximadamente __________ Marella Chimes. 3. Clave el taln izquierdo/derecho en la superficie, como si estuviera tratando de tirar de l [employer]. Tense los msculos de la parte posterior de los muslos (isquiotibiales) para presionar tanto como pueda, sin Corporate treasurer. 4. Mantenga esta posicin durante __________ segundos. 5. Libere la tensin poco a poco y deje que sus msculos se relajen completamente durante __________ [employer]  cada repeticin. Repita __________ veces. Realice este ejercicio __________ veces al da. Isquiotibiales con flexin Realice este ejercicio con pesas en los tobillos si se lo ha indicado el mdico. Comience con pesas de __________ [employer]. Luego aumente el peso en incrementos de [place] [phone]). No use pesas en los tobillos que pesen ms de __________ Kenn File. 1. Recustese boca abajo con las piernas extendidas. 2. Flexione la rodilla izquierda/derecha lo ms que pueda sin Education officer, environmental. Mantenga la cadera apoyada contra el suelo. 3. Mantenga esta posicin durante __________ segundos. 4. Baje lentamente la pierna hasta la posicin inicial. Repita __________ veces. Realice este ejercicio __________ veces al da. Cuclillas Este ejercicio fortalece los msculos de la parte de adelante del muslo y la rodilla (cudriceps). 1. Prese enfrente de [employer], con los pies y las rodillas apuntando hacia adelante. Puede colocar las [employer] la mesa para mantener el equilibrio, pero no para apoyarse. Skillman y baje la cadera, como si fuera a sentarse en una silla. ? Mantenga el [employer] Energy, no ArvinMeritor dedos. ? Mantenga la parte inferior de las piernas rectas, de modo que queden paralelas a las patas de la mesa. ? No deje que la cadera quede ms abajo que las rodillas. ? No flexione las rodillas ms de lo indicado por el mdico. ? Si el dolor  aumenta, no las flexione  tanto. 3. Mantenga la posicin de cuclillas durante __________ segundos. [place] para pararse de [place]. No use las manos para pararse. Repita __________ veces. Realice este ejercicio __________ veces al da. Deslizamientos sobre la pared [employer] ejercicio fortalece los msculos de la parte de adelante del muslo y la rodilla (cudriceps). 1. Apoye la espalda contra una pared o una puerta, pero mantenga los pies a una distancia de entre 18 y 24pulgadas (66 y 61cm) de la pared o Counselling psychologist. 2. Separe los pies al ancho de caderas. 3. Deslcese lentamente hacia abajo, contra la pared o la puerta, de modo que sus rodillas queden flexionadas a __________ Marella Chimes. Mantenga las [employer]ys[employer]us, no sobre la punta de los pies. Mantenga las rodillas alineadas con las caderas. 4. Mantenga esta posicin durante __________ segundos. Repita __________ veces. Realice este ejercicio __________ veces al da. Elevaciones con la pierna extendida Este ejercicio fortalece los msculos que rotan la pierna en la cadera y la alejan del cuerpo (abductores de cadera). 1. Recustese de costado con la pierna izquierda/derecha arriba. Recustese de Lowe[employer] Companies cabeza, los hombros, la rodilla y la cadera estn alineados. Puede flexionar la rodilla de abajo para mantener el equilibrio. 2. Mueva ligeramente las caderas hacia adelante, de modo que queden alineadas y la rodilla izquierda/derecha quede hacia adelante. 3. Con el taln como gua, levante la pierna de 4 a 6pulgadas (10 a 15cm). Debe sentir que se elevan los msculos de la parte externa de la cadera. ? No lleve el pie hacia adelante. ? No gire la rodilla hacia el techo. 4. Mantenga esta posicin durante __________ segundos. 5. Regrese lentamente la pierna a la posicin inicial. 6. Deje que los msculos se relajen completamente despus de cada repeticin. Repita __________ veces. Realice este ejercicio __________ veces al da. Elevaciones  con la pierna extendida Este ejercicio [employer] Rx que extienden las caderas desde la parte anterior de la pelvis (extensores de cadera). 1. Acustese boca abajo sobre una superficie firme. Puede colocar un almohadn debajo de la cadera si le resulta ms cmodo. 2. Tense los msculos de las nalgas para levantar la pierna izquierda/derecha unas 4 a 6pulgadas (10 a 15cm). Mantenga la rodilla estirada mientras eleva la pierna. 3. Mantenga esta posicin durante __________ segundos. 4. Baje lentamente la pierna hasta la posicin inicial. 5. Deje que la pierna se relaje completamente despus de cada repeticin. Repita __________ veces. Realice este ejercicio __________ veces al da. Esta informacin no tiene Marine scientist el consejo del mdico. Asegrese de hacerle al mdico cualquier pregunta que tenga. Document Released: 09/08/2005 Document Revised: 02/15/2018 Document Reviewed: 02/15/2018 Elsevier Patient Education  2020 [employer].

## 2019-03-24 ENCOUNTER — Other Ambulatory Visit: Payer: Self-pay

## 2019-03-24 ENCOUNTER — Ambulatory Visit: Payer: BC Managed Care – PPO | Attending: Internal Medicine

## 2019-03-24 DIAGNOSIS — Z20822 Contact with and (suspected) exposure to covid-19: Secondary | ICD-10-CM

## 2019-03-25 LAB — NOVEL CORONAVIRUS, NAA: SARS-CoV-2, NAA: NOT DETECTED

## 2019-04-07 ENCOUNTER — Other Ambulatory Visit: Payer: Self-pay

## 2019-04-07 ENCOUNTER — Ambulatory Visit: Payer: BC Managed Care – PPO | Attending: Internal Medicine

## 2019-04-07 DIAGNOSIS — Z20822 Contact with and (suspected) exposure to covid-19: Secondary | ICD-10-CM

## 2019-04-09 LAB — NOVEL CORONAVIRUS, NAA: SARS-CoV-2, NAA: DETECTED — AB

## 2019-04-13 ENCOUNTER — Encounter: Payer: Self-pay | Admitting: Nurse Practitioner

## 2019-04-13 ENCOUNTER — Telehealth (INDEPENDENT_AMBULATORY_CARE_PROVIDER_SITE_OTHER): Payer: BC Managed Care – PPO | Admitting: Nurse Practitioner

## 2019-04-13 VITALS — Temp 97.2°F | Ht 60.63 in

## 2019-04-13 DIAGNOSIS — U071 COVID-19: Secondary | ICD-10-CM | POA: Diagnosis not present

## 2019-04-13 DIAGNOSIS — J9801 Acute bronchospasm: Secondary | ICD-10-CM | POA: Diagnosis not present

## 2019-04-13 DIAGNOSIS — B349 Viral infection, unspecified: Secondary | ICD-10-CM | POA: Diagnosis not present

## 2019-04-13 MED ORDER — DM-GUAIFENESIN ER 30-600 MG PO TB12: 1.0000 | ORAL_TABLET | Freq: Two times a day (BID) | ORAL | 0 refills | Status: DC | PRN

## 2019-04-13 MED ORDER — ALBUTEROL SULFATE HFA 108 (90 BASE) MCG/ACT IN AERS: 1.0000 | INHALATION_SPRAY | Freq: Four times a day (QID) | RESPIRATORY_TRACT | 0 refills | Status: DC | PRN

## 2019-04-13 MED ORDER — PROMETHAZINE-PHENYLEPH-CODEINE 6.25-5-10 MG/5ML PO SYRP: 5.0000 mL | ORAL_SOLUTION | Freq: Three times a day (TID) | ORAL | 0 refills | Status: DC | PRN

## 2019-04-13 NOTE — Progress Notes (Signed)
Virtual Visit via Video Note  I connected with Sheri Rice on 04/13/19 at 11:00 AM EST by a video enabled telemedicine application and verified that I am speaking with the correct person using two identifiers.  Location: Patient:home Provider:office  Participants: patient and provider I discussed the limitations of evaluation and management by telemedicine and the availability of in person appointments. The patient expressed understanding and agreed to proceed.   [id number] infection, cough and SOB History of Present Illness: Cough The current episode started 1 to 4 weeks ago. The problem has been unchanged. Associated symptoms include chills, myalgias, nasal congestion, postnasal drip, rhinorrhea and shortness of breath. Pertinent negatives include no fever, heartburn, hemoptysis, sore throat, sweats, weight loss or wheezing. The symptoms are aggravated by lying down. She has tried OTC cough suppressant for the symptoms. The treatment provided mild relief. There is no history of bronchitis or pneumonia.  positive COVID test 03/25/2019. Onset of symptoms 03/24/2019, exposed on 03/17/2019 by husband, son is also symptomatic and hospitalized.  Observations/Objective: Physical Exam  Constitutional: She is oriented to person, place, and time. No distress.  Pulmonary/Chest: Effort normal.  Neurological: She is alert and oriented to person, place, and time.  Skin: Skin is dry.  Psychiatric: She has a normal mood and affect. Her behavior is normal. Thought content normal.   Assessment and Plan: Gwili was seen today for cough.  Diagnoses and all orders for this visit:  COVID-19 -     Promethazine-Phenyleph-Codeine 6.25-5-10 MG/5ML SYRP; Take 5 mLs by mouth every 8 (eight) hours as needed. -     albuterol (VENTOLIN HFA) 108 (90 Base) MCG/ACT inhaler; Inhale 1-2 puffs into the lungs every 6 (six) hours as needed for wheezing or shortness of breath. -     dextromethorphan-guaiFENesin  (MUCINEX DM) 30-600 MG 12hr tablet; Take 1 tablet by mouth 2 (two) times daily as needed for cough.  Acute bronchospasm due to viral infection -     Promethazine-Phenyleph-Codeine 6.25-5-10 MG/5ML SYRP; Take 5 mLs by mouth every 8 (eight) hours as needed. -     albuterol (VENTOLIN HFA) 108 (90 Base) MCG/ACT inhaler; Inhale 1-2 puffs into the lungs every 6 (six) hours as needed for wheezing or shortness of breath. -     dextromethorphan-guaiFENesin (MUCINEX DM) 30-600 MG 12hr tablet; Take 1 tablet by mouth 2 (two) times daily as needed for cough.   Follow Up Instructions: See avs   I discussed the assessment and treatment plan with the patient. The patient was provided an opportunity to ask questions and all were answered. The patient agreed with the plan and demonstrated an understanding of the instructions.   The patient was advised to call back or seek an in-person evaluation if the symptoms worsen or if the condition fails to improve as anticipated.   Wilfred Lacy, NP

## 2019-04-15 ENCOUNTER — Ambulatory Visit (INDEPENDENT_AMBULATORY_CARE_PROVIDER_SITE_OTHER): Payer: BC Managed Care – PPO | Admitting: Medical

## 2019-04-15 ENCOUNTER — Encounter: Payer: Self-pay | Admitting: Nurse Practitioner

## 2019-04-15 VITALS — BP 124/90 | HR 87 | Temp 98.1°F | Ht 60.0 in | Wt 162.0 lb

## 2019-04-15 DIAGNOSIS — R05 Cough: Secondary | ICD-10-CM

## 2019-04-15 DIAGNOSIS — R5383 Other fatigue: Secondary | ICD-10-CM

## 2019-04-15 DIAGNOSIS — R110 Nausea: Secondary | ICD-10-CM

## 2019-04-15 DIAGNOSIS — U071 COVID-19: Secondary | ICD-10-CM

## 2019-04-15 DIAGNOSIS — R059 Cough, unspecified: Secondary | ICD-10-CM

## 2019-04-15 NOTE — Patient Instructions (Signed)
Your symptoms are improving by the week.  Your lungs sound clear today  If you need to use the cough syrup prescribed by your primary care doctor, use it when at home due to possible drowsiness.   During the day if you have cough, you can use either the inhaler or Delsym over the counter.  Continue to hydrate well with water, gradually resume your activity.   The fatigue will gradually improve.  Continue your multivamin.  You can use your once daily iron supplement  Follow up with your PCP about the anemia, may be a repeat blood count in the next few weeks

## 2019-04-15 NOTE — Progress Notes (Signed)
Subjective:  Sheri Rice is a 64 y.o. female who presents for covid f/u at [facility]  [id number], Charlene Brooke, NP  She presents today for follow-up.  Her initial symptoms started March 24, 2019.  She was positive for Covid on 03/25/2019.  She notes the first week she felt really bad with body aches, chills, weakness, cough, nausea, orthopnea, nasal congestion.  The second week was a little better.  And then gradually her symptoms have improved.  Showing to make sure her lungs were clear today and that she was safe to go back to work.  During that time she was sick her husband and son were also sick.  Her son does not live with them, but he had to be hospitalized.  He is doing better now and out of the hospital.  She had a video visit 2 days ago.  She still felt very weak up until this past week.  She does feel like she is improving, but she was prescribed some medications 2 days ago.  She has used the inhaler once, she used the cough syrup once and is using the Mucinex..  In the last few days her only symptoms have really been some mild sinus pressure and a little shortness of breath.  She gets a occasional cough.  Last nausea was 3 days ago.  No vomiting.  She never had a fever.  She is supposed to go back to work on Monday in 3 days.  No other aggravating or relieving factors.  No other c/o.  Past Medical History:  Diagnosis Date  . GERD (gastroesophageal reflux disease)    Current Outpatient Medications on File Prior to Visit  Medication Sig Dispense Refill  . acetaminophen (TYLENOL) 325 MG tablet Take 2 tablets (650 mg total) by mouth every 6 (six) hours as needed. (Patient taking differently: Take 650 mg by mouth as needed. )    . albuterol (VENTOLIN HFA) 108 (90 Base) MCG/ACT inhaler Inhale 1-2 puffs into the lungs every 6 (six) hours as needed for wheezing or shortness of breath. 6.7 g 0  . Cholecalciferol (VITAMIN D3 PO) Take by mouth.    .  dextromethorphan-guaiFENesin (MUCINEX DM) 30-600 MG 12hr tablet Take 1 tablet by mouth 2 (two) times daily as needed for cough. 14 tablet 0  . diclofenac Sodium (VOLTAREN) 1 % GEL Apply 2 g topically 4 (four) times daily. 100 g 1  . Multiple Vitamins-Minerals (MULTIVITAMIN WITH MINERALS) tablet Take 1 tablet by mouth daily. (Nature-Made Brand)    . omeprazole (PRILOSEC) 20 MG capsule Take 1 capsule (20 mg total) by mouth 2 (two) times daily before a meal. 30 capsule 1  . pravastatin (PRAVACHOL) 40 MG tablet Take 1 tablet (40 mg total) by mouth daily. 90 tablet 3  . Promethazine-Phenyleph-Codeine 6.25-5-10 MG/5ML SYRP Take 5 mLs by mouth every 8 (eight) hours as needed. 118 mL 0   Current Facility-Administered Medications on File Prior to Visit  Medication Dose Route Frequency Provider Last Rate Last Admin  . 0.9 %  sodium chloride infusion  500 mL Intravenous Once Irene Shipper, MD        ROS as in subjective   Objective: BP 124/90   Pulse 87   Temp 98.1 F (36.7 C)   Ht 5' (1.524 m)   Wt 162 lb (73.5 kg)   SpO2 97%   BMI 31.64 kg/m    Wt Readings from Last 3 Encounters:  04/15/19 162 lb (73.5 kg)  03/16/19  162 lb 6.4 oz (73.7 kg)  11/09/18 162 lb 6.4 oz (73.7 kg)    General appearance: Alert, WD/WN, no distress, not ill-appearing                             Skin: warm, no rash                           Head: no sinus tenderness                            Eyes: conjunctiva normal, corneas clear, PERRLA                            Ears: pearly TMs, external ear canals normal                          Nose: septum midline, turbinates normal,, no erythema and no discharge             Mouth/throat: MMM, tongue normal,no pharyngeal erythema                           Neck: supple, no adenopathy, no thyromegaly, non tender                          Heart: RRR, normal S1, S2, no murmurs                         Lungs: CTA bilaterally, no wheezes, rales, or rhonchi No calf tenderness no  calf swelling or asymmetry negative Homans       Assessment  Encounter Diagnoses  Name Primary?  . COVID-19 virus infection Yes  . Fatigue, unspecified type   . Cough   . Nausea       Plan: Her exam today is reassuring.  Her vital signs are stable.  I reviewed labs from the chart record from March 16, 2019.  She had mild anemia.  She notes history of anemia and was planning to restart iron once daily.  She is already taking a multivitamin.  I advised that she has completed a quarantine.  And she is safe to go back to work Monday.  Patient Instructions  Your symptoms are improving by the week.  Your lungs sound clear today  If you need to use the cough syrup prescribed by your primary care doctor, use it when at home due to possible drowsiness.   During the day if you have cough, you can use either the inhaler or Delsym over the counter.  Continue to hydrate well with water, gradually resume your activity.   The fatigue will gradually improve.  Continue your multivamin.  You can use your once daily iron supplement  Follow up with your PCP about the anemia, may be a repeat blood count in the next few weeks   She voiced understanding and agreement of recommendations

## 2019-04-15 NOTE — Patient Instructions (Signed)
You are schedule with respiratory clinic on Friday, but do not hesitant to go to urgent care clinic/ED if your symptoms worsen. Maintain adequate oral hydration.  Cough, Adult A cough helps to clear your throat and lungs. A cough may be a sign of an illness or another medical condition. An acute cough may only last 2-3 weeks, while a chronic cough may last 8 or more weeks. Many things can cause a cough. They include:  Germs (viruses or bacteria) that attack the airway.  Breathing in things that bother (irritate) your lungs.  Allergies.  Asthma.  Mucus that runs down the back of your throat (postnasal drip).  Smoking.  Acid backing up from the stomach into the tube that moves food from the mouth to the stomach (gastroesophageal reflux).  Some medicines.  Lung problems.  Other medical conditions, such as heart failure or a blood clot in the lung (pulmonary embolism). Follow these instructions at home: Medicines  Take over-the-counter and prescription medicines only as told by your doctor.  Talk with your doctor before you take medicines that stop a cough (coughsuppressants). Lifestyle   Do not smoke, and try not to be around smoke. Do not use any products that contain nicotine or tobacco, such as cigarettes, e-cigarettes, and chewing tobacco. If you need help quitting, ask your doctor.  Drink enough fluid to keep your pee (urine) pale yellow.  Avoid caffeine.  Do not drink alcohol if your doctor tells you not to drink. General instructions   Watch for any changes in your cough. Tell your doctor about them.  Always cover your mouth when you cough.  Stay away from things that make you cough, such as perfume, candles, campfire smoke, or cleaning products.  If the air is dry, use a cool mist vaporizer or humidifier in your home.  If your cough is worse at night, try using extra pillows to raise your head up higher while you sleep.  Rest as needed.  Keep all  follow-up visits as told by your doctor. This is important. Contact a doctor if:  You have new symptoms.  You cough up pus.  Your cough does not get better after 2-3 weeks, or your cough gets worse.  Cough medicine does not help your cough and you are not sleeping well.  You have pain that gets worse or pain that is not helped with medicine.  You have a fever.  You are losing weight and you do not know why.  You have night sweats. Get help right away if:  You cough up blood.  You have trouble breathing.  Your heartbeat is very fast. These symptoms may be an emergency. Do not wait to see if the symptoms will go away. Get medical help right away. Call your local emergency services (911 in the [place] Do not drive yourself to the hospital. Summary  A cough helps to clear your throat and lungs. Many things can cause a cough.  Take over-the-counter and prescription medicines only as told by your doctor.  Always cover your mouth when you cough.  Contact a doctor if you have new symptoms or you have a cough that does not get better or gets worse. This information is not intended to replace advice given to you by your health care provider. Make sure you discuss any questions you have with your health care provider. Document Revised: 04/05/2018 Document Reviewed: 04/05/2018 Elsevier Patient Education  [place].

## 2019-04-28 ENCOUNTER — Other Ambulatory Visit: Payer: Self-pay | Admitting: Nurse Practitioner

## 2019-04-28 DIAGNOSIS — R498 Other voice and resonance disorders: Secondary | ICD-10-CM

## 2019-04-28 MED ORDER — OMEPRAZOLE 20 MG PO CPDR: 20.0000 mg | DELAYED_RELEASE_CAPSULE | Freq: Every day | ORAL | 1 refills | Status: DC

## 2019-05-16 ENCOUNTER — Telehealth (INDEPENDENT_AMBULATORY_CARE_PROVIDER_SITE_OTHER): Payer: BC Managed Care – PPO | Admitting: Nurse Practitioner

## 2019-05-16 ENCOUNTER — Encounter: Payer: Self-pay | Admitting: Nurse Practitioner

## 2019-05-16 VITALS — Ht 60.0 in

## 2019-05-16 DIAGNOSIS — S161XXA Strain of muscle, fascia and tendon at neck level, initial encounter: Secondary | ICD-10-CM | POA: Diagnosis not present

## 2019-05-16 MED ORDER — CYCLOBENZAPRINE HCL 7.5 MG PO TABS: 7.5000 mg | ORAL_TABLET | Freq: Every day | ORAL | 0 refills | Status: DC

## 2019-05-16 MED ORDER — ACETAMINOPHEN 500 MG PO TABS: 1000.0000 mg | ORAL_TABLET | Freq: Three times a day (TID) | ORAL | Status: AC | PRN

## 2019-05-16 MED ORDER — PREDNISONE 20 MG PO TABS: 40.0000 mg | ORAL_TABLET | Freq: Every day | ORAL | 0 refills | Status: DC

## 2019-05-16 NOTE — Progress Notes (Signed)
Virtual Visit via Video Note  I connected with@ on 05/19/19 at 11:30 AM EST by a video enabled telemedicine application and verified that I am speaking with the correct person using two identifiers.  Location: Patient:Home Provider: Office Participants: patient and provider   II discussed the limitations of evaluation and management by telemedicine and the availability of in person appointments. I also discussed with the patient that there may be a patient responsible charge related to this service. The patient expressed understanding and agreed to proceed.  [id number] and upper pain  History of Present Illness: Neck Pain  This is a new problem. The current episode started in the past 7 days. The problem has been unchanged. The pain is associated with a sleep position. The pain is present in the left side. The quality of the pain is described as cramping and shooting. The pain is severe. The symptoms are aggravated by twisting. The pain is worse during the night. Stiffness is present at night. Associated symptoms include numbness and tingling. Pertinent negatives include no chest pain, fever, headaches, leg pain, pain with swallowing, paresis, photophobia, syncope, trouble swallowing, visual change, weakness or weight loss. She has tried NSAIDs for the symptoms. The treatment provided mild relief.   Observations/Objective: Physical Exam  Constitutional: She is oriented to person, place, and time.  Cardiovascular: Normal rate.  Pulmonary/Chest: Effort normal.  Musculoskeletal:     Cervical back: Decreased range of motion.  Neurological: She is alert and oriented to person, place, and time.  Skin: Skin is dry. No rash noted.  Psychiatric: She has a normal mood and affect. Her behavior is normal.   Assessment and Plan: Chanise was seen today for pain.  Diagnoses and all orders for this visit:  Posterolateral cervical muscle strain, initial encounter -     acetaminophen (TYLENOL) 500 MG  tablet; Take 2 tablets (1,000 mg total) by mouth every 8 (eight) hours as needed. -     predniSONE (DELTASONE) 20 MG tablet; Take 2 tablets (40 mg total) by mouth daily with breakfast. -     cyclobenzaprine (FEXMID) 7.5 MG tablet; Take 1 tablet (7.5 mg total) by mouth at bedtime.    Follow Up Instructions: See avs   I discussed the assessment and treatment plan with the patient. The patient was provided an opportunity to ask questions and all were answered. The patient agreed with the plan and demonstrated an understanding of the instructions.   The patient was advised to call back or seek an in-person evaluation if the symptoms worsen or if the condition fails to improve as anticipated.  Wilfred Lacy, NP

## 2019-05-19 ENCOUNTER — Encounter: Payer: Self-pay | Admitting: Nurse Practitioner

## 2019-09-13 ENCOUNTER — Other Ambulatory Visit: Payer: Self-pay

## 2019-09-14 ENCOUNTER — Ambulatory Visit: Payer: BC Managed Care – PPO | Admitting: Nurse Practitioner

## 2019-09-14 ENCOUNTER — Encounter: Payer: Self-pay | Admitting: Nurse Practitioner

## 2019-09-14 VITALS — BP 110/62 | HR 67 | Temp 97.4°F | Ht 60.0 in | Wt 162.6 lb

## 2019-09-14 DIAGNOSIS — M25562 Pain in left knee: Secondary | ICD-10-CM

## 2019-09-14 DIAGNOSIS — E782 Mixed hyperlipidemia: Secondary | ICD-10-CM

## 2019-09-14 DIAGNOSIS — K2100 Gastro-esophageal reflux disease with esophagitis, without bleeding: Secondary | ICD-10-CM | POA: Diagnosis not present

## 2019-09-14 DIAGNOSIS — M25552 Pain in left hip: Secondary | ICD-10-CM

## 2019-09-14 DIAGNOSIS — G8929 Other chronic pain: Secondary | ICD-10-CM

## 2019-09-14 MED ORDER — PRAVASTATIN SODIUM 40 MG PO TABS: 40.0000 mg | ORAL_TABLET | Freq: Every day | ORAL | 3 refills | Status: DC

## 2019-09-14 MED ORDER — OMEPRAZOLE 20 MG PO CPDR: 20.0000 mg | DELAYED_RELEASE_CAPSULE | Freq: Every day | ORAL | 3 refills | Status: DC

## 2019-09-14 MED ORDER — NABUMETONE 500 MG PO TABS: 500.0000 mg | ORAL_TABLET | Freq: Two times a day (BID) | ORAL | 0 refills | Status: DC

## 2019-09-14 NOTE — Patient Instructions (Signed)
Schedule lab appt (need to be fasting 6hrs prior to blood draw, ok to drink water).  You will be contacted for outpatient physical therapy. Start relafen for hip and knee pain (with food)  Start home exercise. Apply cold compress after completion of home exercise, 4mins.  Hip Exercises Ask your health care provider which exercises are safe for you. Do exercises exactly as told by your health care provider and adjust them as directed. It is normal to feel mild stretching, pulling, tightness, or discomfort as you do these exercises. Stop right away if you feel sudden pain or your pain gets worse. Do not begin these exercises until told by your health care provider. Stretching and range-of-motion exercises These exercises warm up your muscles and joints and improve the movement and flexibility of your hip. These exercises also help to relieve pain, numbness, and tingling. You may be asked to limit your range of motion if you had a hip replacement. Talk to your health care provider about these restrictions. Hamstrings, supine  1. Lie on your back (supine position). 2. Loop a belt or towel over the ball of your left / right foot. The ball of your foot is on the walking surface, right under your toes. 3. Straighten your left / right knee and slowly pull on the belt or towel to raise your leg until you feel a gentle stretch behind your knee (hamstring). ? Do not let your knee bend while you do this. ? Keep your other leg flat on the floor. 4. Hold this position for _____10_____ seconds. 5. Slowly return your leg to the starting position. Repeat ____10______ times. Complete this exercise ____1______ times a day. Hip rotation  1. Lie on your back on a firm surface. 2. With your left / right hand, gently pull your left / right knee toward the shoulder that is on the same side of the body. Stop when your knee is pointing toward the ceiling. 3. Hold your left / right ankle with your other  hand. 4. Keeping your knee steady, gently pull your left / right ankle toward your other shoulder until you feel a stretch in your buttocks. ? Keep your hips and shoulders firmly planted while you do this stretch. 5. Hold this position for __________ seconds. Repeat __________ times. Complete this exercise __________ times a day. Seated stretch This exercise is sometimes called hamstrings and adductors stretch. 1. Sit on the floor with your legs stretched wide. Keep your knees straight during this exercise. 2. Keeping your head and back in a straight line, bend at your waist to reach for your left foot (position A). You should feel a stretch in your right inner thigh (adductors). 3. Hold this position for __________ seconds. Then slowly return to the upright position. 4. Keeping your head and back in a straight line, bend at your waist to reach forward (position B). You should feel a stretch behind both of your thighs and knees (hamstrings). 5. Hold this position for __________ seconds. Then slowly return to the upright position. 6. Keeping your head and back in a straight line, bend at your waist to reach for your right foot (position C). You should feel a stretch in your left inner thigh (adductors). 7. Hold this position for __________ seconds. Then slowly return to the upright position. Repeat __________ times. Complete this exercise __________ times a day. Lunge This exercise stretches the muscles of the hip (hip flexors). 1. Place your left / right knee on the floor and  bend your other knee so that is directly over your ankle. You should be half-kneeling. 2. Keep good posture with your head over your shoulders. 3. Tighten your buttocks to point your tailbone downward. This will prevent your back from arching too much. 4. You should feel a gentle stretch in the front of your left / right thigh and hip. If you do not feel a stretch, slide your other foot forward slightly and then slowly lunge  forward with your chest up until your knee once again lines up over your ankle. ? Make sure your tailbone continues to point downward. 5. Hold this position for __________ seconds. 6. Slowly return to the starting position. Repeat __________ times. Complete this exercise __________ times a day. Strengthening exercises These exercises build strength and endurance in your hip. Endurance is the ability to use your muscles for a long time, even after they get tired. Bridge This exercise strengthens the muscles of your hip (hip extensors). 1. Lie on your back on a firm surface with your knees bent and your feet flat on the floor. 2. Tighten your buttocks muscles and lift your bottom off the floor until the trunk of your body and your hips are level with your thighs. ? Do not arch your back. ? You should feel the muscles working in your buttocks and the back of your thighs. If you do not feel these muscles, slide your feet 1-2 inches (2.5-5 cm) farther away from your buttocks. 3. Hold this position for __________ seconds. 4. Slowly lower your hips to the starting position. 5. Let your muscles relax completely between repetitions. Repeat __________ times. Complete this exercise __________ times a day. Straight leg raises, side-lying This exercise strengthens the muscles that move the hip joint away from the center of the body (hip abductors). 1. Lie on your side with your left / right leg in the top position. Lie so your head, shoulder, hip, and knee line up. You may bend your bottom knee slightly to help you balance. 2. Roll your hips slightly forward, so your hips are stacked directly over each other and your left / right knee is facing forward. 3. Leading with your heel, lift your top leg 4-6 inches (10-15 cm). You should feel the muscles in your top hip lifting. ? Do not let your foot drift forward. ? Do not let your knee roll toward the ceiling. 4. Hold this position for __________  seconds. 5. Slowly return to the starting position. 6. Let your muscles relax completely between repetitions. Repeat __________ times. Complete this exercise __________ times a day. Straight leg raises, side-lying This exercise strengthens the muscles that move the hip joint toward the center of the body (hip adductors). 1. Lie on your side with your left / right leg in the bottom position. Lie so your head, shoulder, hip, and knee line up. You may place your upper foot in front to help you balance. 2. Roll your hips slightly forward, so your hips are stacked directly over each other and your left / right knee is facing forward. 3. Tense the muscles in your inner thigh and lift your bottom leg 4-6 inches (10-15 cm). 4. Hold this position for __________ seconds. 5. Slowly return to the starting position. 6. Let your muscles relax completely between repetitions. Repeat __________ times. Complete this exercise __________ times a day. Straight leg raises, supine This exercise strengthens the muscles in the front of your thigh (quadriceps). 1. Lie on your back (supine position) with  your left / right leg extended and your other knee bent. 2. Tense the muscles in the front of your left / right thigh. You should see your kneecap slide up or see increased dimpling just above your knee. 3. Keep these muscles tight as you raise your leg 4-6 inches (10-15 cm) off the floor. Do not let your knee bend. 4. Hold this position for __________ seconds. 5. Keep these muscles tense as you lower your leg. 6. Relax the muscles slowly and completely between repetitions. Repeat __________ times. Complete this exercise __________ times a day. Hip abductors, standing This exercise strengthens the muscles that move the leg and hip joint away from the center of the body (hip abductors). 1. Tie one end of a rubber exercise band or tubing to a secure surface, such as a chair, table, or pole. 2. Loop the other end of the  band or tubing around your left / right ankle. 3. Keeping your ankle with the band or tubing directly opposite the secured end, step away until there is tension in the tubing or band. Hold on to a chair, table, or pole as needed for balance. 4. Lift your left / right leg out to your side. While you do this: ? Keep your back upright. ? Keep your shoulders over your hips. ? Keep your toes pointing forward. ? Make sure to use your hip muscles to slowly lift your leg. Do not tip your body or forcefully lift your leg. 5. Hold this position for __________ seconds. 6. Slowly return to the starting position. Repeat __________ times. Complete this exercise __________ times a day. Squats This exercise strengthens the muscles in the front of your thigh (quadriceps). 1. Stand in a door frame so your feet and knees are in line with the frame. You may place your hands on the frame for balance. 2. Slowly bend your knees and lower your hips like you are going to sit in a chair. ? Keep your lower legs in a straight-up-and-down position. ? Do not let your hips go lower than your knees. ? Do not bend your knees lower than told by your health care provider. ? If your hip pain increases, do not bend as low. 3. Hold this position for ___________ seconds. 4. Slowly push with your legs to return to standing. Do not use your hands to pull yourself to standing. Repeat __________ times. Complete this exercise __________ times a day. This information is not intended to replace advice given to you by your health care provider. Make sure you discuss any questions you have with your health care provider. Document Revised: 10/21/2018 Document Reviewed: 01/26/2018 Elsevier Patient Education  [place].

## 2019-09-14 NOTE — Progress Notes (Signed)
Subjective:  Patient ID: Sheri Rice, female    DOB: Jul 27, 1955  Age: 64 y.o. MRN: [id number]  CC: Follow-up (6 mo follow up hyperlipidemia-pt not fasting//pt complains of legs bothering her mainly left knee-really bothering 3 month//no mammogram done)  Knee Pain  The incident occurred more than 1 week ago. There was no injury mechanism. The pain is present in the left knee and left hip. The quality of the pain is described as aching. The pain has been intermittent since onset. Pertinent negatives include no inability to bear weight, loss of motion, loss of sensation, muscle weakness, numbness or tingling. She reports no foreign bodies present. The symptoms are aggravated by palpation and movement. She has tried NSAIDs and rest for the symptoms. The treatment provided moderate relief.  pain is worse with going down stairs. Use on voltaren gel and knee brace with minimal relief. Left hip DG completed 2013: normal  Hyperlipidemia: No adverse side effects with pravastatin Not fasting today, so she will like to schedule lab appt for another day.  Reviewed past Medical, Social and Family history today.  Outpatient Medications Prior to Visit  Medication Sig Dispense Refill  . acetaminophen (TYLENOL) 500 MG tablet Take 2 tablets (1,000 mg total) by mouth every 8 (eight) hours as needed.    . chlorhexidine (PERIDEX) 0.12 % solution 15 mLs 2 (two) times daily.    . Cholecalciferol (VITAMIN D3 PO) Take by mouth.    . Multiple Vitamins-Minerals (MULTIVITAMIN WITH MINERALS) tablet Take 1 tablet by mouth daily. (Nature-Made Brand)    . omeprazole (PRILOSEC) 20 MG capsule Take 1 capsule (20 mg total) by mouth daily. 90 capsule 1  . pravastatin (PRAVACHOL) 40 MG tablet Take 1 tablet (40 mg total) by mouth daily. 90 tablet 3  . ZINC SULFATE PO Take by mouth.    Marland Kitchen albuterol (VENTOLIN HFA) 108 (90 Base) MCG/ACT inhaler Inhale 1-2 puffs into the lungs every 6 (six) hours as needed for wheezing or  shortness of breath. (Patient not taking: Reported on 09/14/2019) 6.7 g 0  . cyclobenzaprine (FEXMID) 7.5 MG tablet Take 1 tablet (7.5 mg total) by mouth at bedtime. (Patient not taking: Reported on 09/14/2019) 7 tablet 0  . diclofenac Sodium (VOLTAREN) 1 % GEL Apply 2 g topically 4 (four) times daily. (Patient not taking: Reported on 09/14/2019) 100 g 1  . predniSONE (DELTASONE) 20 MG tablet Take 2 tablets (40 mg total) by mouth daily with breakfast. (Patient not taking: Reported on 09/14/2019) 6 tablet 0   Facility-Administered Medications Prior to Visit  Medication Dose Route Frequency Provider Last Rate Last Admin  . 0.9 %  sodium chloride infusion  500 mL Intravenous Once Irene Shipper, MD        ROS See HPI  Objective:  BP 110/62   Pulse 67   Temp (!) 97.4 F (36.3 C) (Tympanic)   Ht 5' (1.524 m)   Wt 162 lb 9.6 oz (73.8 kg)   SpO2 96%   BMI 31.76 kg/m   BP Readings from Last 3 Encounters:  09/14/19 110/62  04/15/19 124/90  03/16/19 124/87    Wt Readings from Last 3 Encounters:  09/14/19 162 lb 9.6 oz (73.8 kg)  04/15/19 162 lb (73.5 kg)  03/16/19 162 lb 6.4 oz (73.7 kg)    Physical Exam Vitals reviewed.  Constitutional:      Appearance: She is obese.  Cardiovascular:     Rate and Rhythm: Normal rate and regular rhythm.  Pulses: Normal pulses.     Heart sounds: Normal heart sounds.  Pulmonary:     Effort: Pulmonary effort is normal.     Breath sounds: Normal breath sounds.  Musculoskeletal:        General: Tenderness present. No swelling or deformity.     Right hip: Normal.     Left hip: Tenderness present. No bony tenderness or crepitus. Normal range of motion. Normal strength.     Right upper leg: Normal.     Left upper leg: Normal.     Right knee: Normal.     Left knee: No swelling, effusion, erythema, lacerations or crepitus. Normal range of motion. Tenderness present over the medial joint line. No lateral joint line or patellar tendon tenderness. Normal  pulse.     Right lower leg: Normal. No edema.     Left lower leg: Normal. No edema.     Comments: Lateral left hip tenderness  Skin:    General: Skin is warm and dry.  Neurological:     Mental Status: She is alert and oriented to person, place, and time.  Psychiatric:        Mood and Affect: Mood normal.        Behavior: Behavior normal.        Thought Content: Thought content normal.     Lab Results  Component Value Date   WBC 7.6 03/16/2019   HGB 11.9 (L) 03/16/2019   HCT 35.9 (L) 03/16/2019   PLT 300.0 03/16/2019   GLUCOSE 94 03/16/2019   CHOL 205 (H) 11/09/2018   TRIG 189.0 (H) 11/09/2018   HDL 60.20 11/09/2018   LDLDIRECT 112.0 04/01/2017   LDLCALC 107 (H) 11/09/2018   ALT 19 03/16/2019   AST 22 03/16/2019   NA 140 03/16/2019   K 4.0 03/16/2019   CL 103 03/16/2019   CREATININE 0.72 03/16/2019   BUN 18 03/16/2019   CO2 31 03/16/2019   TSH 3.28 03/16/2019   HGBA1C 5.4 03/03/2016    US BREAST LTD UNI RIGHT INC AXILLA  Result Date: 06/11/2017 CLINICAL DATA:  64 year old patient was called back from screening mammogram for evaluation of a mass in the right breast. I reviewed the screening mammogram images today from June 02, 2017 and circles were placed in the medial periareolar right breast on both views, denoting a small mass. EXAM: ULTRASOUND OF THE RIGHT BREAST COMPARISON:  Screening mammogram June 02, 2017 FINDINGS: On physical exam, no mass is palpated in the medial periareolar right breast. Targeted ultrasound is performed, showing 2 small oval cysts in the 3 o'clock position of the right breast 1 cm from the nipple. A 0.7 cm cyst with a few internal septations is present. A smaller 0.5 cm cyst with a few thin internal septations is seen in the adjacent tissue. No suspicious mass. IMPRESSION: Two benign subcentimeter cysts in the 3 o'clock position the right breast. No suspicious mass is seen in this region of the right breast. RECOMMENDATION: Screening mammogram in  one year.(Code:[id number]-[id number]) I have discussed the findings and recommendations with the patient. Results were also provided in writing at the conclusion of the visit. If applicable, a reminder letter will be sent to the patient regarding the next appointment. BI-RADS CATEGORY  2: Benign. Electronically Signed   By: Curlene Dolphin M.D.   On: 06/11/2017 12:57    Assessment & Plan:  This visit occurred during the SARS-CoV-2 public health emergency.  Safety protocols were in place, including screening questions prior to the  visit, additional usage of staff PPE, and extensive cleaning of exam room while observing appropriate contact time as indicated for disinfecting solutions.   Sheri Rice was seen today for follow-up.  Diagnoses and all orders for this visit:  Chronic pain of left knee -     Ambulatory referral to Physical Therapy -     nabumetone (RELAFEN) 500 MG tablet; Take 1 tablet (500 mg total) by mouth 2 (two) times daily. With food  HYPERLIPIDEMIA, MIXED, MILD -     Lipid panel; Future -     Hepatic function panel; Future -     CK (Creatine Kinase); Future -     pravastatin (PRAVACHOL) 40 MG tablet; Take 1 tablet (40 mg total) by mouth daily.  Chronic left hip pain -     Ambulatory referral to Physical Therapy -     nabumetone (RELAFEN) 500 MG tablet; Take 1 tablet (500 mg total) by mouth 2 (two) times daily. With food  Gastroesophageal reflux disease with esophagitis without hemorrhage -     omeprazole (PRILOSEC) 20 MG capsule; Take 1 capsule (20 mg total) by mouth daily.   I have discontinued Keali Wethington's diclofenac Sodium, albuterol, ZINC SULFATE PO, predniSONE, and cyclobenzaprine. I am also having her start on nabumetone. Additionally, I am having her maintain her multivitamin with minerals, Cholecalciferol (VITAMIN D3 PO), acetaminophen, chlorhexidine, pravastatin, and omeprazole. We will continue to administer sodium chloride.  Meds ordered this encounter  Medications  .  pravastatin (PRAVACHOL) 40 MG tablet    Sig: Take 1 tablet (40 mg total) by mouth daily.    Dispense:  90 tablet    Refill:  3    Order Specific Question:   Supervising Provider    Answer:   Ronnald Nian [[id number]]  . nabumetone (RELAFEN) 500 MG tablet    Sig: Take 1 tablet (500 mg total) by mouth 2 (two) times daily. With food    Dispense:  28 tablet    Refill:  0    Order Specific Question:   Supervising Provider    Answer:   Ronnald Nian [[id number]]  . omeprazole (PRILOSEC) 20 MG capsule    Sig: Take 1 capsule (20 mg total) by mouth daily.    Dispense:  90 capsule    Refill:  3    Order Specific Question:   Supervising Provider    Answer:   Ronnald Nian [[id number]]    Problem List Items Addressed This Visit      Other   HOARSENESS   Relevant Medications   omeprazole (PRILOSEC) 20 MG capsule   HYPERLIPIDEMIA, MIXED, MILD   Relevant Medications   pravastatin (PRAVACHOL) 40 MG tablet   Other Relevant Orders   Lipid panel   Hepatic function panel   CK (Creatine Kinase)    Other Visit Diagnoses    Chronic pain of left knee    -  Primary   Relevant Medications   nabumetone (RELAFEN) 500 MG tablet   Other Relevant Orders   Ambulatory referral to Physical Therapy   Chronic left hip pain       Relevant Medications   nabumetone (RELAFEN) 500 MG tablet   Other Relevant Orders   Ambulatory referral to Physical Therapy      Follow-up: Return in about 6 months (around 03/15/2020) for CPE (fasting).  Wilfred Lacy, NP

## 2019-10-17 ENCOUNTER — Ambulatory Visit (HOSPITAL_COMMUNITY): Payer: BC Managed Care – PPO | Attending: Nurse Practitioner | Admitting: Physical Therapy

## 2020-03-19 ENCOUNTER — Encounter: Payer: BC Managed Care – PPO | Admitting: Nurse Practitioner

## 2020-10-22 ENCOUNTER — Ambulatory Visit: Payer: Self-pay | Admitting: Nurse Practitioner

## 2020-11-05 ENCOUNTER — Ambulatory Visit: Payer: 59 | Admitting: Nurse Practitioner

## 2020-11-05 ENCOUNTER — Other Ambulatory Visit: Payer: Self-pay

## 2020-11-13 ENCOUNTER — Ambulatory Visit: Payer: 59 | Admitting: Nurse Practitioner

## 2020-12-18 ENCOUNTER — Encounter: Payer: Self-pay | Admitting: Nurse Practitioner

## 2020-12-18 ENCOUNTER — Other Ambulatory Visit: Payer: Self-pay

## 2020-12-18 ENCOUNTER — Ambulatory Visit (INDEPENDENT_AMBULATORY_CARE_PROVIDER_SITE_OTHER): Payer: 59 | Admitting: Nurse Practitioner

## 2020-12-18 ENCOUNTER — Ambulatory Visit (INDEPENDENT_AMBULATORY_CARE_PROVIDER_SITE_OTHER): Payer: 59

## 2020-12-18 ENCOUNTER — Other Ambulatory Visit (HOSPITAL_COMMUNITY)
Admission: RE | Admit: 2020-12-18 | Discharge: 2020-12-18 | Disposition: A | Discharge status: Home / Self Care | Payer: 59 | Source: Ambulatory Visit | Attending: Nurse Practitioner | Admitting: Nurse Practitioner

## 2020-12-18 VITALS — BP 128/74 | HR 80 | Temp 97.4°F | Ht 60.0 in | Wt 164.0 lb

## 2020-12-18 DIAGNOSIS — Z1231 Encounter for screening mammogram for malignant neoplasm of breast: Secondary | ICD-10-CM

## 2020-12-18 DIAGNOSIS — Z780 Asymptomatic menopausal state: Secondary | ICD-10-CM | POA: Diagnosis not present

## 2020-12-18 DIAGNOSIS — E782 Mixed hyperlipidemia: Secondary | ICD-10-CM

## 2020-12-18 DIAGNOSIS — Z124 Encounter for screening for malignant neoplasm of cervix: Secondary | ICD-10-CM | POA: Diagnosis not present

## 2020-12-18 DIAGNOSIS — G8929 Other chronic pain: Secondary | ICD-10-CM

## 2020-12-18 DIAGNOSIS — E781 Pure hyperglyceridemia: Secondary | ICD-10-CM | POA: Diagnosis not present

## 2020-12-18 DIAGNOSIS — Z23 Encounter for immunization: Secondary | ICD-10-CM | POA: Diagnosis not present

## 2020-12-18 DIAGNOSIS — M25562 Pain in left knee: Secondary | ICD-10-CM | POA: Diagnosis not present

## 2020-12-18 DIAGNOSIS — E559 Vitamin D deficiency, unspecified: Secondary | ICD-10-CM

## 2020-12-18 DIAGNOSIS — E039 Hypothyroidism, unspecified: Secondary | ICD-10-CM

## 2020-12-18 DIAGNOSIS — Z0000 Encounter for general adult medical examination without abnormal findings: Secondary | ICD-10-CM

## 2020-12-18 LAB — BASIC METABOLIC PANEL
BUN: 14 mg/dL (ref 6–23)
CO2: 31 mEq/L (ref 19–32)
Calcium: 9.5 mg/dL (ref 8.4–10.5)
Chloride: 103 mEq/L (ref 96–112)
Creatinine, Ser: 0.72 mg/dL (ref 0.40–1.20)
GFR: 87.88 mL/min (ref >60.00)
Glucose, Bld: 81 mg/dL (ref 70–99)
Potassium: 4.1 mEq/L (ref 3.5–5.1)
Sodium: 141 mEq/L (ref 135–145)

## 2020-12-18 LAB — LIPID PANEL
Cholesterol: 195 mg/dL (ref 0–200)
HDL: 57.2 mg/dL (ref >39.00)
NonHDL: 137.3
Total CHOL/HDL Ratio: 3
Triglycerides: 232 mg/dL — ABNORMAL HIGH (ref 0.0–149.0)
VLDL: 46.4 mg/dL — ABNORMAL HIGH (ref 0.0–40.0)

## 2020-12-18 LAB — LDL CHOLESTEROL, DIRECT: Direct LDL: 108 mg/dL

## 2020-12-18 LAB — HEPATIC FUNCTION PANEL
ALT: 17 U/L (ref 0–35)
AST: 20 U/L (ref 0–37)
Albumin: 4.3 g/dL (ref 3.5–5.2)
Alkaline Phosphatase: 82 U/L (ref 39–117)
Bilirubin, Direct: 0.1 mg/dL (ref 0.0–0.3)
Total Bilirubin: 0.5 mg/dL (ref 0.2–1.2)
Total Protein: 7.3 g/dL (ref 6.0–8.3)

## 2020-12-18 LAB — T4, FREE: Free T4: 0.77 ng/dL (ref 0.60–1.60)

## 2020-12-18 LAB — TSH: TSH: 4.35 u[IU]/mL (ref 0.35–5.50)

## 2020-12-18 LAB — VITAMIN D 25 HYDROXY (VIT D DEFICIENCY, FRACTURES): VITD: 34.59 ng/mL (ref 30.00–100.00)

## 2020-12-18 LAB — URIC ACID: Uric Acid, Serum: 5 mg/dL (ref 2.4–7.0)

## 2020-12-18 NOTE — Patient Instructions (Addendum)
  Sheri Rice , Thank you for taking time to come for your Medicare Wellness Visit. I appreciate your ongoing commitment to your health goals. Please review the following plan we discussed and let me know if I can assist you in the future.   These are the goals we discussed: Go to lab for blood draw and knee x-ray. You will be contacted to schedule appt for mammogram and bone density. Bring copy of advance directive (living will and HCPOA). Start daily exercise (walking and stretching)   This is a list of the screening recommended for you and due dates:  Health Maintenance  Topic Date Due   Zoster (Shingles) Vaccine (1 of 2) Never done   Mammogram  06/03/2019   COVID-19 Vaccine (3 - Booster for Pfizer series) 12/01/2019   Pap Smear  04/01/2020   DEXA scan (bone density measurement)  Never done   Flu Shot  10/29/2020   Tetanus Vaccine  04/02/2027   Colon Cancer Screening  06/30/2027   Hepatitis C Screening: USPSTF Recommendation to screen - Ages 18-79 yo.  Completed   HIV Screening  Completed   HPV Vaccine  Aged Out

## 2020-12-18 NOTE — Assessment & Plan Note (Signed)
Repeat lipid panel Reports itching with pravastatin. She stopped medication 1.22months ago

## 2020-12-18 NOTE — Assessment & Plan Note (Addendum)
Reports persistent myalgia and fatigue. Check TSH and T4, vit. D

## 2020-12-18 NOTE — Assessment & Plan Note (Signed)
Associated with mild swelling and crepitus. She also reports joint instability. Denies any previous injury. Check uric acid and knee x-ray today

## 2020-12-18 NOTE — Progress Notes (Signed)
Subjective:    Patient ID: Sheri Rice, female    DOB: 25-Oct-1955, 65 y.o.   MRN: [id number]  Patient presents today for welcome to Medicare visit  Accompanied by her Husband  Knee Pain  The incident occurred more than 1 week ago (ongoing for several years). There was no injury mechanism. The pain is present in the left knee, left hip and left leg. The quality of the pain is described as aching and cramping. The pain is severe. The pain has been Intermittent since onset. Pertinent negatives include no inability to bear weight, loss of motion, loss of sensation, muscle weakness, numbness or tingling. She reports no foreign bodies present. Exacerbated by: prolonged inactivity. She has tried NSAIDs for the symptoms. The treatment provided mild relief.  Denies any AM stiffness. Pain improves with mobility  Hypothyroidism Reports persistent myalgia and fatigue. Check TSH and T4, vit. D  HYPERLIPIDEMIA, MIXED, MILD Repeat lipid panel Reports itching with pravastatin. She stopped medication 1.78months ago  Chronic pain of left knee Associated with mild swelling and crepitus. She also reports joint instability. Denies any previous injury. Check uric acid and knee x-ray today   Here for medicare wellness, no new complaints. Please see A/P for status and treatment of chronic medical problems.   Diet: heart healthy Physical activity: sedentary Depression/mood screen: negative Hearing: intact to whispered voice Visual acuity: grossly normal, performs annual eye exam  ADLs: capable Fall risk: none Home safety: good Cognitive evaluation: intact to orientation, naming, recall and repetition EOL planning: adv directives discussed, copy requested  I have personally reviewed and have noted 1. The patient's medical and social history - reviewed today no changes 2. Their use of alcohol, tobacco or illicit drugs 3. Their current medications and supplements 4. The patient's functional  ability including ADL's, fall risks, home safety risks and hearing or visual impairment. 5. Diet and physical activities 6. Evidence for depression or mood disorders 7. Care team reviewed and updated (available in snapshot)   Vision: use of corrective lens, upcoming appt with opthalmology Vision Screening   Right eye Left eye Both eyes  Without correction     With correction 25/25 25/25 20/20    Dental: will schedule Exercise:none Weight:  Wt Readings from Last 3 Encounters:  12/18/20 164 lb (74.4 kg)  09/14/19 162 lb 9.6 oz (73.8 kg)  04/15/19 162 lb (73.5 kg)    Sexual History (orientation,birth control, marital status, STD):agreed to breast and pelvic exam today  Depression/Suicide: Depression screen [facility] 2/9 12/18/2020 09/14/2019 03/16/2019 11/09/2018 04/01/2017  Decreased Interest 0 0 0 0 0  Down, Depressed, Hopeless 0 0 1 0 0  PHQ - 2 Score 0 0 1 0 0   MMSE - Mini Mental State Exam 12/18/2020  Orientation to time 5  Orientation to Place 5  Registration 3  Attention/ Calculation 3  Recall 2  Language- name 2 objects 2  Language- repeat 1  Language- follow 3 step command 3  Language- read & follow direction 1  Write a sentence 1  Copy design 1  Total score 27    Immunizations: (TDAP, Hep C screen, Pneumovax, Influenza, zoster): she agreed to pneumovax 20 and influenza vaccine today. Health Maintenance  Topic Date Due   Zoster (Shingles) Vaccine (1 of 2) Never done   Mammogram  06/03/2019   COVID-19 Vaccine (3 - Booster for Pfizer series) 12/01/2019   DEXA scan (bone density measurement)  Never done   Pap Smear  12/19/2023   Tetanus Vaccine  04/02/2027   Colon Cancer Screening  06/30/2027   Flu Shot  Completed   Hepatitis C Screening: USPSTF Recommendation to screen - Ages 18-79 yo.  Completed   HIV Screening  Completed   HPV Vaccine  Aged Out   Fall Risk: Fall Risk  12/18/2020 09/14/2019 03/16/2019 11/09/2018 11/09/2018 04/01/2017  Falls in the past year? 0 0 0 1 0 No   Number falls in past yr: 0 0 - 0 - -  Injury with Fall? 0 0 - 1 - -  Comment - - - right hand painful - -  Risk for fall due to : No Fall Risks - - - - -  Follow up Falls evaluation completed - - - - -   Advanced Directive: Advanced Directives 12/18/2020  Does Patient Have a Medical Advance Directive? No  Would patient like information on creating a medical advance directive? Yes (MAU/Ambulatory/Procedural Areas - Information given)    Medications and allergies reviewed with patient and updated if appropriate.  Patient Active Problem List   Diagnosis Date Noted   Hypothyroidism 12/18/2020   Chronic pain of left knee 12/18/2020   Globus hystericus 04/06/2017   Gastroesophageal reflux disease 02/23/2017   Weakness 03/03/2016   Herpes zoster 09/25/2015   Urinary urgency 04/18/2015   DDD (degenerative disc disease), cervical 03/16/2015   Osteoarthritis of both knees 03/16/2015   COUGH 03/18/2010   HYPERLIPIDEMIA, MIXED, MILD 05/28/2007   ANAL OR RECTAL PAIN 05/28/2007   URINARY INCONTINENCE, MIXED 02/05/2007   RESTLESS LEG SYNDROME 12/08/2006   CHEST PAIN, ATYPICAL 12/08/2006   DYSMENORRHEA 12/05/2006   HEADACHE 12/05/2006   Allergic rhinitis 12/04/2006   PLANTAR FASCIITIS, BILATERAL 10/05/2006   HOARSENESS 07/28/2006   FIBROIDS, UTERUS 03/31/2001    Current Outpatient Medications on File Prior to Visit  Medication Sig Dispense Refill   acetaminophen (TYLENOL) 500 MG tablet Take 2 tablets (1,000 mg total) by mouth every 8 (eight) hours as needed.     Cholecalciferol (VITAMIN D3 PO) Take 5,000 Units by mouth daily.     magnesium gluconate (MAGONATE) 500 MG tablet Take 250 mg by mouth daily.     Multiple Vitamins-Minerals (MULTIVITAMIN WITH MINERALS) tablet Take 1 tablet by mouth daily. (Nature-Made Brand)     omeprazole (PRILOSEC) 20 MG capsule Take 1 capsule (20 mg total) by mouth daily. 90 capsule 3   Potassium 99 MG TABS Take 50 mg by mouth daily.     Current  Facility-Administered Medications on File Prior to Visit  Medication Dose Route Frequency Provider Last Rate Last Admin   0.9 %  sodium chloride infusion  500 mL Intravenous Once Irene Shipper, MD        Past Medical History:  Diagnosis Date   GERD (gastroesophageal reflux disease)     Past Surgical History:  Procedure Laterality Date   DILATION AND CURETTAGE OF UTERUS      Social History   Socioeconomic History   Marital status: Married    Spouse name: Not on file   Number of children: 3   Years of education: 11   Highest education level: Not on file  Occupational History   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   Tobacco Use   Smoking status: Never   Smokeless tobacco: Never  Vaping Use   Vaping Use: Never used  Substance and Sexual Activity   Alcohol use: No    Alcohol/week: 0.0 standard drinks   Drug use: No   Sexual activity: Yes    Birth control/protection: Post-menopausal  Other Topics Concern   Not on file  Social History Narrative   Not on file   Social Determinants of Health   Financial Resource Strain: Not on file  Food Insecurity: Not on file  Transportation Needs: Not on file  Physical Activity: Not on file  Stress: Not on file  Social Connections: Not on file    Family History  Problem Relation Age of Onset   Diabetes Mother    Depression Mother    Stroke Maternal Grandmother    Colon cancer Neg Hx    Liver cancer Neg Hx         Review of Systems  Constitutional:  Negative for fever, malaise/fatigue and weight loss.  HENT:  Negative for congestion and sore throat.   Eyes:        Negative for visual changes  Respiratory:  Negative for cough and shortness of breath.   Cardiovascular:  Negative for chest pain, palpitations and leg swelling.  Gastrointestinal:  Negative for blood in stool, constipation, diarrhea and heartburn.  Genitourinary:  Negative for dysuria, frequency and urgency.  Musculoskeletal:  Negative for falls, joint pain and myalgias.   Skin:  Negative for rash.  Neurological:  Negative for dizziness, tingling, sensory change, numbness and headaches.  Endo/Heme/Allergies:  Does not bruise/bleed easily.  Psychiatric/Behavioral:  Negative for depression, substance abuse and suicidal ideas. The patient is not nervous/anxious.    Objective:   Vitals:   12/18/20 1037  BP: 128/74  Pulse: 80  Temp: (!) 97.4 F (36.3 C)   Body mass index is 32.03 kg/m.  Physical Examination:  Physical Exam Vitals reviewed. Exam conducted with a chaperone present.  Constitutional:      General: She is not in acute distress.    Appearance: She is obese.  HENT:     Right Ear: Hearing, tympanic membrane, ear canal and external ear normal.     Left Ear: Hearing, tympanic membrane, ear canal and external ear normal.     Ears:     Weber exam findings: Does not lateralize.    Right Rinne: AC > BC.    Left Rinne: AC > BC. Eyes:     General: No scleral icterus.    Extraocular Movements: Extraocular movements intact.     Conjunctiva/sclera: Conjunctivae normal.     Pupils: Pupils are equal, round, and reactive to light.  Neck:     Thyroid: No thyromegaly.  Cardiovascular:     Rate and Rhythm: Normal rate.     Heart sounds: Normal heart sounds.  Pulmonary:     Effort: Pulmonary effort is normal.     Breath sounds: Normal breath sounds.  Chest:     Chest wall: No tenderness.  Breasts:    Breasts are symmetrical.     Right: Normal.     Left: Normal.  Abdominal:     General: Bowel sounds are normal. There is no distension.     Palpations: Abdomen is soft.     Tenderness: There is no abdominal tenderness.     Hernia: There is no hernia in the left inguinal area or right inguinal area.  Genitourinary:    General: Normal vulva.     Labia:        Right: No rash, tenderness or lesion.        Left: No rash, tenderness or lesion.   Musculoskeletal:        General: Swelling and tenderness present. Normal range of motion.      Cervical  back: Normal range of motion and neck supple.     Right lower leg: No edema.     Left lower leg: No edema.  Lymphadenopathy:     Cervical: No cervical adenopathy.     Upper Body:     Right upper body: No supraclavicular, axillary or pectoral adenopathy.     Left upper body: No supraclavicular, axillary or pectoral adenopathy.     Lower Body: No right inguinal adenopathy. No left inguinal adenopathy.  Skin:    General: Skin is warm and dry.  Neurological:     Mental Status: She is alert and oriented to person, place, and time.  Psychiatric:        Mood and Affect: Mood normal.        Behavior: Behavior normal.        Thought Content: Thought content normal.        Judgment: Judgment normal.    ASSESSMENT and PLAN: This visit occurred during the SARS-CoV-2 public health emergency.  Safety protocols were in place, including screening questions prior to the visit, additional usage of staff PPE, and extensive cleaning of exam room while observing appropriate contact time as indicated for disinfecting solutions.   Deepti was seen today for follow-up.  Diagnoses and all orders for this visit:  Welcome to Medicare preventive visit  Hypothyroidism, unspecified type -     TSH -     T4, free -     Basic metabolic panel -     Hepatic function panel -     levothyroxine (SYNTHROID) 100 MCG tablet; Take 1 tablet (100 mcg total) by mouth daily before breakfast.  Pure hypertriglyceridemia -     Lipid panel -     Basic metabolic panel -     Hepatic function panel  Chronic pain of left knee -     Uric acid -     DG Knee Complete 4 Views Left -     meloxicam (MOBIC) 7.5 MG tablet; Take 1 tablet (7.5 mg total) by mouth daily. With food  Encounter for Papanicolaou smear for cervical cancer screening -     Cytology - PAP  Breast cancer screening by mammogram -     MM 3D SCREEN BREAST BILATERAL; Future  Asymptomatic age-related postmenopausal state -     DG Bone Density;  Future  Vitamin D insufficiency -     VITAMIN D 25 Hydroxy (Vit-D Deficiency, Fractures)  Flu vaccine need -     Flu Vaccine QUAD High Dose(Fluad)  Need for pneumococcal vaccine -     Pneumococcal conjugate vaccine 20-valent (Prevnar 20)  Other orders -     LDL cholesterol, direct     Problem List Items Addressed This Visit       Endocrine   Hypothyroidism    Reports persistent myalgia and fatigue. Check TSH and T4, vit. D      Relevant Medications   levothyroxine (SYNTHROID) 100 MCG tablet   Other Relevant Orders   TSH (Completed)   T4, free (Completed)   Basic metabolic panel (Completed)   Hepatic function panel (Completed)     Other   Chronic pain of left knee    Associated with mild swelling and crepitus. She also reports joint instability. Denies any previous injury. Check uric acid and knee x-ray today      Relevant Medications   meloxicam (MOBIC) 7.5 MG tablet   Other Relevant Orders   Uric acid (Completed)   DG Knee Complete 4  Views Left (Completed)   HYPERLIPIDEMIA, MIXED, MILD    Repeat lipid panel Reports itching with pravastatin. She stopped medication 1.97months ago      Other Visit Diagnoses     Welcome to Medicare preventive visit    -  Primary   Encounter for Papanicolaou smear for cervical cancer screening       Relevant Orders   Cytology - PAP (Completed)   Breast cancer screening by mammogram       Relevant Orders   MM 3D SCREEN BREAST BILATERAL   Asymptomatic age-related postmenopausal state       Relevant Orders   DG Bone Density   Vitamin D insufficiency       Relevant Orders   VITAMIN D 25 Hydroxy (Vit-D Deficiency, Fractures) (Completed)   Flu vaccine need       Relevant Orders   Flu Vaccine QUAD High Dose(Fluad) (Completed)   Need for pneumococcal vaccine       Relevant Orders   Pneumococcal conjugate vaccine 20-valent (Prevnar 20) (Completed)       Follow up: Return in about 3 months (around 03/19/2021) for left knee  pain.  Wilfred Lacy, NP

## 2020-12-19 LAB — CYTOLOGY - PAP
Comment: NEGATIVE
Diagnosis: NEGATIVE
High risk HPV: NEGATIVE

## 2020-12-21 MED ORDER — LEVOTHYROXINE SODIUM 100 MCG PO TABS: 100.0000 ug | ORAL_TABLET | Freq: Every day | ORAL | 1 refills | Status: DC

## 2020-12-21 MED ORDER — MELOXICAM 7.5 MG PO TABS: 7.5000 mg | ORAL_TABLET | Freq: Every day | ORAL | 1 refills | Status: DC

## 2021-01-23 ENCOUNTER — Ambulatory Visit: Payer: 59

## 2021-03-19 ENCOUNTER — Ambulatory Visit: Payer: 59 | Admitting: Nurse Practitioner

## 2021-03-29 ENCOUNTER — Ambulatory Visit: Payer: 59

## 2021-03-29 ENCOUNTER — Other Ambulatory Visit: Payer: 59

## 2021-04-08 ENCOUNTER — Ambulatory Visit (INDEPENDENT_AMBULATORY_CARE_PROVIDER_SITE_OTHER): Payer: Medicare HMO | Admitting: Nurse Practitioner

## 2021-04-08 ENCOUNTER — Encounter: Payer: Self-pay | Admitting: Nurse Practitioner

## 2021-04-08 ENCOUNTER — Other Ambulatory Visit: Payer: Self-pay

## 2021-04-08 VITALS — BP 129/81 | HR 80 | Ht 60.5 in | Wt 165.0 lb

## 2021-04-08 DIAGNOSIS — Z780 Asymptomatic menopausal state: Secondary | ICD-10-CM

## 2021-04-08 DIAGNOSIS — E669 Obesity, unspecified: Secondary | ICD-10-CM

## 2021-04-08 DIAGNOSIS — E782 Mixed hyperlipidemia: Secondary | ICD-10-CM

## 2021-04-08 DIAGNOSIS — M25562 Pain in left knee: Secondary | ICD-10-CM

## 2021-04-08 DIAGNOSIS — E039 Hypothyroidism, unspecified: Secondary | ICD-10-CM

## 2021-04-08 DIAGNOSIS — K2100 Gastro-esophageal reflux disease with esophagitis, without bleeding: Secondary | ICD-10-CM | POA: Diagnosis not present

## 2021-04-08 DIAGNOSIS — G8929 Other chronic pain: Secondary | ICD-10-CM

## 2021-04-08 DIAGNOSIS — Z1231 Encounter for screening mammogram for malignant neoplasm of breast: Secondary | ICD-10-CM

## 2021-04-08 MED ORDER — LEVOTHYROXINE SODIUM 100 MCG PO TABS: 100.0000 ug | ORAL_TABLET | Freq: Every day | ORAL | 3 refills | Status: DC

## 2021-04-08 MED ORDER — OMEPRAZOLE 20 MG PO CPDR: 20.0000 mg | DELAYED_RELEASE_CAPSULE | Freq: Every day | ORAL | 3 refills | Status: DC

## 2021-04-08 NOTE — Assessment & Plan Note (Signed)
-  Continue omeprazole 20 mg daily

## 2021-04-08 NOTE — Assessment & Plan Note (Signed)
She ws taking synthroid 100 mcg for hypothyroidism , not sure why med was discontinue.  Restart  synthroid 100 mcg daily, check TSH in 2 months

## 2021-04-08 NOTE — Assessment & Plan Note (Signed)
Continue tylenol as needed, refused RX ibuprofen, pt requested for a refera to otho

## 2021-04-08 NOTE — Assessment & Plan Note (Signed)
Wt Readings from Last 3 Encounters:  04/08/21 165 lb (74.8 kg)  12/18/20 164 lb (74.4 kg)  09/14/19 162 lb 9.6 oz (73.8 kg)  pt stated  that their son bought an exercise bike for them , she is planning to start exercising.

## 2021-04-08 NOTE — Assessment & Plan Note (Signed)
dexa scan ordered.

## 2021-04-08 NOTE — Assessment & Plan Note (Signed)
screening mammogram ordered.

## 2021-04-08 NOTE — Assessment & Plan Note (Signed)
Lipid panel ordered. Lab Results  Component Value Date   CHOL 195 12/18/2020   HDL 57.20 12/18/2020   LDLCALC 107 (H) 11/09/2018   LDLDIRECT 108.0 12/18/2020   TRIG 232.0 (H) 12/18/2020   CHOLHDL 3 12/18/2020

## 2021-04-08 NOTE — Patient Instructions (Addendum)
Please get your labs done 3-5 days before your next appointment.    It is important that you exercise regularly at least 30 minutes 5 times a week.  Think about what you will eat, plan ahead. Choose " clean, green, fresh or frozen" over canned, processed or packaged foods which are more sugary, salty and fatty. 70 to 75% of food eaten should be vegetables and fruit. Three meals at set times with snacks allowed between meals, but they must be fruit or vegetables. Aim to eat over a 12 hour period , example 7 am to 7 pm, and STOP after  your last meal of the day. Drink water,generally about 64 ounces per day, no other drink is as healthy. Fruit juice is best enjoyed in a healthy way, by EATING the fruit.  Thanks for choosing [facility], we consider it a privelige to serve you.

## 2021-04-08 NOTE — Progress Notes (Signed)
° °  Rufus Cypert     MRN: [id number]      DOB: 1955/09/17   HPI Ms. Chaudhary is here to establish care. Previous PCP at [facility] NP at [facility] primary care . Interpretation services provided by Hillery Hunter (staff of for [employer] access partner). FH and PMH reviewed   Pt stated that she was taking pravastatin but she stopped taking med due to rashes, not sure of the dosage she was taking, she was taking  levothyroxine  she is not sure how much the dosage was .The last time she took both medicines was in september 2022.  Pt c/o chronic  left knee joint pain,cramps, stiffness of left knee joint, pain is  normally 5/10,  uses tylenol as needed. denies numbness, tingling she is able to move her knees  She is up to date with PAP, needs mammogram , dexa scan stated that she is up to date with her required vaccines , she will bring her vaccin card at next visit.    ROS Denies recent fever or chills. Denies sinus pressure, nasal congestion, ear pain or sore throat. Denies chest congestion, productive cough or wheezing. Denies chest pains, palpitations and leg swelling Denies abdominal pain, nausea, vomiting,diarrhea or constipation.   Denies dysuria, frequency, hesitancy or incontinence. Has left knee joint pain, no swelling and limitation in mobility. Denies headaches, seizures, numbness, or tingling. Denies depression, anxiety or insomnia. Denies skin break down or rash.   PE  BP 129/81 (BP Location: Right Arm, Patient Position: Sitting, Cuff Size: Large)    Pulse 80    Ht 5' 0.5" (1.537 m)    Wt 165 lb (74.8 kg)    SpO2 96%    BMI 31.69 kg/m   Patient alert and oriented and in no cardiopulmonary distress.  HEENT: No facial asymmetry, EOMI,     Neck supple .  Chest: Clear to auscultation bilaterally.  CVS: S1, S2 no murmurs, no S3.Regular rate.  ABD: Soft non tender.   Ext: No edema  MS: Adequate ROM spine, shoulders, hips and knees.tenderness on palpation of left knee  joint  Skin: Intact, no ulcerations or rash noted.  Psych: Good eye contact, normal affect. Memory intact not anxious or depressed appearing.  CNS: CN 2-12 intact, power,  normal throughout.no focal deficits noted.   Assessment & Plan

## 2021-04-11 ENCOUNTER — Encounter: Payer: Self-pay | Admitting: Orthopaedic Surgery

## 2021-04-11 ENCOUNTER — Ambulatory Visit (INDEPENDENT_AMBULATORY_CARE_PROVIDER_SITE_OTHER): Payer: Medicare HMO | Admitting: Orthopaedic Surgery

## 2021-04-11 ENCOUNTER — Other Ambulatory Visit: Payer: Self-pay

## 2021-04-11 VITALS — BP 118/78 | HR 90 | Ht 60.0 in | Wt 166.2 lb

## 2021-04-11 DIAGNOSIS — G8929 Other chronic pain: Secondary | ICD-10-CM

## 2021-04-11 DIAGNOSIS — M25562 Pain in left knee: Secondary | ICD-10-CM

## 2021-04-11 NOTE — Progress Notes (Signed)
Subjective:    Patient ID: Sheri Rice, female    DOB: Aug 25, 1955, 66 y.o.   MRN: [id number]  HPI She has long history of left knee pain for several years getting worse over the last couple of months.  She has popping and swelling and more recently giving way of the left knee.  Nothing seems to help it.  She has tried Tylenol, rest, heat, ice with little help.  It is getting worse.  She was seen at [facility] on 04-08-21 and I have reviewed the notes.  She is accompanied by her husband.  She had x-rays done in September showing: IMPRESSION: 1. No acute abnormality. Left knee is intact. No evidence of significant effusion.   2. Degenerative changes noted about both knees. Tiny right knee joint loose body may be present.   Review of Systems  Constitutional:  Positive for activity change.  Musculoskeletal:  Positive for arthralgias, gait problem and joint swelling.  All other systems reviewed and are negative. For Review of Systems, all other systems reviewed and are negative.  The following is a summary of the past history medically, past history surgically, known current medicines, social history and family history.  This information is gathered electronically by the computer from prior information and documentation.  I review this each visit and have found including this information at this point in the chart is beneficial and informative.   Past Medical History:  Diagnosis Date   GERD (gastroesophageal reflux disease)    Hyperlipidemia     Past Surgical History:  Procedure Laterality Date   DILATION AND CURETTAGE OF UTERUS      Current Outpatient Medications on File Prior to Visit  Medication Sig Dispense Refill   acetaminophen (TYLENOL) 500 MG tablet Take 2 tablets (1,000 mg total) by mouth every 8 (eight) hours as needed.     Cholecalciferol (VITAMIN D3 PO) Take 5,000 Units by mouth daily.     levothyroxine (SYNTHROID) 100 MCG tablet Take 1 tablet (100 mcg  total) by mouth daily. 90 tablet 3   magnesium gluconate (MAGONATE) 500 MG tablet Take 250 mg by mouth daily.     Multiple Vitamins-Minerals (MULTIVITAMIN WITH MINERALS) tablet Take 1 tablet by mouth daily. (Nature-Made Brand)     omeprazole (PRILOSEC) 20 MG capsule Take 1 capsule (20 mg total) by mouth daily. 90 capsule 3   Potassium 99 MG TABS Take 50 mg by mouth daily.     Current Facility-Administered Medications on File Prior to Visit  Medication Dose Route Frequency Provider Last Rate Last Admin   0.9 %  sodium chloride infusion  500 mL Intravenous Once Irene Shipper, MD        Social History   Socioeconomic History   Marital status: Married    Spouse name: Not on file   Number of children: 3   Years of education: 11   Highest education level: Not on file  Occupational History   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   Tobacco Use   Smoking status: Never   Smokeless tobacco: Never  Vaping Use   Vaping Use: Never used  Substance and Sexual Activity   Alcohol use: No    Alcohol/week: 0.0 standard drinks   Drug use: No   Sexual activity: Yes    Birth control/protection: Post-menopausal  Other Topics Concern   Not on file  Social History Narrative   Pt is retired, Married , lives with her husband    Social Determinants of Engineer, drilling  Resource Strain: Not on file  Food Insecurity: Not on file  Transportation Needs: Not on file  Physical Activity: Not on file  Stress: Not on file  Social Connections: Not on file  Intimate Partner Violence: Not on file    Family History  Problem Relation Age of Onset   Diabetes Mother    Depression Mother    Diabetes type II Sister    Hypertension Sister    Stroke Maternal Grandmother    Colon cancer Neg Hx    Liver cancer Neg Hx    Breast cancer Neg Hx    Cervical cancer Neg Hx     BP 118/78    Pulse 90    Ht 5' (1.524 m)    Wt 166 lb 3.2 oz (75.4 kg)    BMI 32.46 kg/m   Body mass index is 32.46 kg/m.     Objective:    Physical Exam Vitals and nursing note reviewed. Exam conducted with a chaperone present.  Constitutional:      Appearance: She is well-developed.  HENT:     Head: Normocephalic and atraumatic.  Eyes:     Conjunctiva/sclera: Conjunctivae normal.     Pupils: Pupils are equal, round, and reactive to light.  Cardiovascular:     Rate and Rhythm: Normal rate and regular rhythm.  Pulmonary:     Effort: Pulmonary effort is normal.  Abdominal:     Palpations: Abdomen is soft.  Musculoskeletal:     Cervical back: Normal range of motion and neck supple.       Legs:  Skin:    General: Skin is warm and dry.  Neurological:     Mental Status: She is alert and oriented to person, place, and time.     Cranial Nerves: No cranial nerve deficit.     Motor: No abnormal muscle tone.     Coordination: Coordination normal.     Deep Tendon Reflexes: Reflexes are normal and symmetric. Reflexes normal.  Psychiatric:        Behavior: Behavior normal.        Thought Content: Thought content normal.        Judgment: Judgment normal.      Assessment & Plan:   Encounter Diagnosis  Name Primary?   Chronic pain of left knee Yes   I am concerned about medial meniscus tear.  I will get MRI of the left knee. She also has loose bodies, very small that I am concerned about within the knee by plain X-rays.  She has not improved with conservative treatment.  PROCEDURE NOTE:  The patient requests injections of the left knee , verbal consent was obtained.  The left knee was prepped appropriately after time out was performed.   Sterile technique was observed and injection of 1 cc of DepoMedrol 40 mg with several cc's of plain xylocaine. Anesthesia was provided by ethyl chloride and a 20-gauge needle was used to inject the knee area. The injection was tolerated well.  A band aid dressing was applied.  The patient was advised to apply ice later today and tomorrow to the injection sight as needed.  Return in two  weeks.  She has GERD and I will not give NSAIDs.  Call if any problem.  Precautions discussed.  Electronically Signed Sanjuana Kava, MD 1/12/202311:10 AM

## 2021-04-16 ENCOUNTER — Ambulatory Visit: Payer: Medicare HMO | Admitting: Orthopaedic Surgery

## 2021-04-30 ENCOUNTER — Ambulatory Visit: Payer: Medicare HMO | Admitting: Orthopaedic Surgery

## 2021-05-03 ENCOUNTER — Ambulatory Visit
Admission: EM | Admit: 2021-05-03 | Discharge: 2021-05-03 | Disposition: A | Discharge status: Home / Self Care | Payer: Medicare HMO | Attending: Family Medicine | Admitting: Family Medicine

## 2021-05-03 ENCOUNTER — Ambulatory Visit (HOSPITAL_COMMUNITY): Payer: Medicare HMO

## 2021-05-03 ENCOUNTER — Other Ambulatory Visit: Payer: Self-pay

## 2021-05-03 DIAGNOSIS — J069 Acute upper respiratory infection, unspecified: Secondary | ICD-10-CM

## 2021-05-03 LAB — POCT RAPID STREP A (OFFICE): Rapid Strep A Screen: NEGATIVE

## 2021-05-03 MED ORDER — PROMETHAZINE-DM 6.25-15 MG/5ML PO SYRP: 5.0000 mL | ORAL_SOLUTION | Freq: Four times a day (QID) | ORAL | 0 refills | Status: DC | PRN

## 2021-05-03 MED ORDER — DEXAMETHASONE SODIUM PHOSPHATE 10 MG/ML IJ SOLN
10.0000 mg | Freq: Once | INTRAMUSCULAR | Status: AC
Start: 2021-05-03 — End: 2021-05-03
  Administered 2021-05-03: 10 mg via INTRAMUSCULAR

## 2021-05-03 NOTE — ED Provider Notes (Signed)
[facility]    CSN: [id number] Arrival date & time: 05/03/21  1149      History   Chief Complaint Chief Complaint  Patient presents with   Cough   Sore Throat        Back Pain    HPI Sheri Rice is a 66 y.o. female.   Presenting today with 3-day history of headache, facial pain and pressure, cough, postnasal drip, sore throat, sneezing.  Denies fever, chills, chest pain, shortness of breath, abdominal pain, nausea vomiting or diarrhea.  So far taking Mucinex and Tylenol with minimal relief.  History of seasonal allergies, not currently on antihistamines and no known history of pulmonary disease.   Past Medical History:  Diagnosis Date   GERD (gastroesophageal reflux disease)    Hyperlipidemia     Patient Active Problem List   Diagnosis Date Noted   Encounter for screening mammogram for malignant neoplasm of breast 04/08/2021   Post-menopausal 04/08/2021   Obesity (BMI 30-39.9) 04/08/2021   Hypothyroidism 12/18/2020   Chronic pain of left knee 12/18/2020   Globus hystericus 04/06/2017   Gastroesophageal reflux disease 02/23/2017   Weakness 03/03/2016   Herpes zoster 09/25/2015   Urinary urgency 04/18/2015   DDD (degenerative disc disease), cervical 03/16/2015   Osteoarthritis of both knees 03/16/2015   COUGH 03/18/2010   HYPERLIPIDEMIA, MIXED, MILD 05/28/2007   ANAL OR RECTAL PAIN 05/28/2007   URINARY INCONTINENCE, MIXED 02/05/2007   RESTLESS LEG SYNDROME 12/08/2006   CHEST PAIN, ATYPICAL 12/08/2006   DYSMENORRHEA 12/05/2006   HEADACHE 12/05/2006   Allergic rhinitis 12/04/2006   PLANTAR FASCIITIS, BILATERAL 10/05/2006   HOARSENESS 07/28/2006   FIBROIDS, UTERUS 03/31/2001    Past Surgical History:  Procedure Laterality Date   DILATION AND CURETTAGE OF UTERUS      OB History   No obstetric history on file.      Home Medications    Prior to Admission medications   Medication Sig Start Date End Date Taking? Authorizing Provider   promethazine-dextromethorphan (PROMETHAZINE-DM) 6.25-15 MG/5ML syrup Take 5 mLs by mouth 4 (four) times daily as needed. 05/03/21  Yes Volney American, PA-C  acetaminophen (TYLENOL) 500 MG tablet Take 2 tablets (1,000 mg total) by mouth every 8 (eight) hours as needed. 05/16/19   Nche, Charlene Brooke, NP  Cholecalciferol (VITAMIN D3 PO) Take 5,000 Units by mouth daily.    [provider]  levothyroxine (SYNTHROID) 100 MCG tablet Take 1 tablet (100 mcg total) by mouth daily. 04/08/21   [name], Dewaine Conger, FNP  magnesium gluconate (MAGONATE) 500 MG tablet Take 250 mg by mouth daily.    [provider]  Multiple Vitamins-Minerals (MULTIVITAMIN WITH MINERALS) tablet Take 1 tablet by mouth daily. (Nature-Made Brand) 06/02/18   Nche, Charlene Brooke, NP  omeprazole (PRILOSEC) 20 MG capsule Take 1 capsule (20 mg total) by mouth daily. 04/08/21   Renee Rival, FNP  Potassium 99 MG TABS Take 50 mg by mouth daily.    [provider]    Family History Family History  Problem Relation Age of Onset   Diabetes Mother    Depression Mother    Diabetes type II Sister    Hypertension Sister    Stroke Maternal Grandmother    Colon cancer Neg Hx    Liver cancer Neg Hx    Breast cancer Neg Hx    Cervical cancer Neg Hx     Social History Social History   Tobacco Use   Smoking status: Never  Smokeless tobacco: Never  Vaping Use   Vaping Use: Never used  Substance Use Topics   Alcohol use: No    Alcohol/week: 0.0 standard drinks   Drug use: Never     Allergies   Patient has no known allergies.   Review of Systems Review of Systems Per HPI  Physical Exam Triage Vital Signs ED Triage Vitals [05/03/21 1219]  Enc Vitals Group     BP 124/85     Pulse Rate 89     Resp 18     Temp 98.1 F (36.7 C)     Temp Source Oral     SpO2 96 %     Weight      Height      Head Circumference      Peak Flow      Pain Score      Pain Loc      Pain Edu?      Excl. in  [place]?    No data found.  Updated Vital Signs BP 124/85 (BP Location: Right Arm)    Pulse 89    Temp 98.1 F (36.7 C) (Oral)    Resp 18    SpO2 96%   Visual Acuity Right Eye Distance:   Left Eye Distance:   Bilateral Distance:    Right Eye Near:   Left Eye Near:    Bilateral Near:     Physical Exam Vitals and nursing note reviewed.  Constitutional:      Appearance: Normal appearance.  HENT:     Head: Atraumatic.     Right Ear: Tympanic membrane and external ear normal.     Left Ear: Tympanic membrane and external ear normal.     Nose: Rhinorrhea present.     Mouth/Throat:     Mouth: Mucous membranes are moist.     Pharynx: Posterior oropharyngeal erythema present.  Eyes:     Extraocular Movements: Extraocular movements intact.     Conjunctiva/sclera: Conjunctivae normal.  Cardiovascular:     Rate and Rhythm: Normal rate and regular rhythm.     Heart sounds: Normal heart sounds.  Pulmonary:     Effort: Pulmonary effort is normal.     Breath sounds: Normal breath sounds. No wheezing or rales.  Musculoskeletal:        General: Normal range of motion.     Cervical back: Normal range of motion and neck supple.  Skin:    General: Skin is warm and dry.  Neurological:     Mental Status: She is alert and oriented to person, place, and time.  Psychiatric:        Mood and Affect: Mood normal.        Thought Content: Thought content normal.     UC Treatments / Results  Labs (all labs ordered are listed, but only abnormal results are displayed) Labs Reviewed  COVID-19, FLU A+B NAA  POCT RAPID STREP A (OFFICE)    EKG   Radiology No results found.  Procedures Procedures (including critical care time)  Medications Ordered in UC Medications  dexamethasone (DECADRON) injection 10 mg (10 mg Intramuscular Given 05/03/21 1304)    Initial Impression / Assessment and Plan / UC Course  I have reviewed the triage vital signs and the nursing notes.  Pertinent labs &  imaging results that were available during my care of the patient were reviewed by me and considered in my medical decision making (see chart for details).     Vital signs benign  and reassuring, exam suspicious for viral upper respiratory infection.  COVID and flu testing pending, rapid strep negative.  Treat with IM Decadron, Phenergan DM, supportive over-the-counter medications and home care.  Return for acutely worsening symptoms.  Final Clinical Impressions(s) / UC Diagnoses   Final diagnoses:  Viral URI with cough   Discharge Instructions   None    ED Prescriptions     Medication Sig Dispense Auth. Provider   promethazine-dextromethorphan (PROMETHAZINE-DM) 6.25-15 MG/5ML syrup Take 5 mLs by mouth 4 (four) times daily as needed. 100 mL Volney American, [place]      PDMP not reviewed this encounter.   Volney American, [place] 05/03/21 1313

## 2021-05-03 NOTE — ED Triage Notes (Signed)
Pt reports headache, left side of face feels pain and pressure, cough, back pain when coughing, sore throat worse when coughing or sneezing x 3 days.

## 2021-05-04 LAB — COVID-19, FLU A+B NAA
Influenza A, NAA: NOT DETECTED
Influenza B, NAA: NOT DETECTED
SARS-CoV-2, NAA: NOT DETECTED

## 2021-05-09 ENCOUNTER — Ambulatory Visit: Payer: Medicare HMO | Admitting: Orthopaedic Surgery

## 2021-05-13 ENCOUNTER — Ambulatory Visit (HOSPITAL_COMMUNITY)
Admission: RE | Admit: 2021-05-13 | Discharge: 2021-05-13 | Disposition: A | Discharge status: Home / Self Care | Payer: Medicare HMO | Source: Ambulatory Visit | Attending: Nurse Practitioner | Admitting: Nurse Practitioner

## 2021-05-13 ENCOUNTER — Other Ambulatory Visit: Payer: Self-pay

## 2021-05-13 ENCOUNTER — Other Ambulatory Visit: Payer: Self-pay | Admitting: Nurse Practitioner

## 2021-05-13 DIAGNOSIS — Z780 Asymptomatic menopausal state: Secondary | ICD-10-CM | POA: Diagnosis present

## 2021-05-13 DIAGNOSIS — M8589 Other specified disorders of bone density and structure, multiple sites: Secondary | ICD-10-CM

## 2021-05-13 DIAGNOSIS — Z1231 Encounter for screening mammogram for malignant neoplasm of breast: Secondary | ICD-10-CM | POA: Insufficient documentation

## 2021-05-13 MED ORDER — CALCIUM CARB-CHOLECALCIFEROL 600-20 MG-MCG PO TABS: 600.0000 mg | ORAL_TABLET | Freq: Two times a day (BID) | ORAL | 6 refills | Status: DC

## 2021-05-13 NOTE — Progress Notes (Signed)
Normal mammogram. Pls notify pt. Thanks

## 2021-05-13 NOTE — Progress Notes (Signed)
Pls review result with pt. Pt has osteopenia. She should star taking caltrate 600+D3. Take one pill twice daily. Pls use Spanish interpreter services. Thanks

## 2021-05-15 ENCOUNTER — Other Ambulatory Visit: Payer: Self-pay

## 2021-05-15 ENCOUNTER — Ambulatory Visit (HOSPITAL_COMMUNITY)
Admission: RE | Admit: 2021-05-15 | Discharge: 2021-05-15 | Disposition: A | Discharge status: Home / Self Care | Payer: Medicare HMO | Source: Ambulatory Visit | Attending: Orthopaedic Surgery | Admitting: Orthopaedic Surgery

## 2021-05-15 DIAGNOSIS — G8929 Other chronic pain: Secondary | ICD-10-CM | POA: Diagnosis present

## 2021-05-15 DIAGNOSIS — M25562 Pain in left knee: Secondary | ICD-10-CM | POA: Insufficient documentation

## 2021-05-21 ENCOUNTER — Ambulatory Visit: Payer: Medicare HMO | Admitting: Orthopaedic Surgery

## 2021-05-21 ENCOUNTER — Other Ambulatory Visit: Payer: Self-pay

## 2021-05-21 ENCOUNTER — Encounter: Payer: Self-pay | Admitting: Orthopaedic Surgery

## 2021-05-21 DIAGNOSIS — G8929 Other chronic pain: Secondary | ICD-10-CM | POA: Diagnosis not present

## 2021-05-21 DIAGNOSIS — M25562 Pain in left knee: Secondary | ICD-10-CM

## 2021-05-21 NOTE — Patient Instructions (Signed)
Ruptura de meniscos Meniscus Tear La ruptura de meniscos es una lesin en la rodilla que se produce cuando se rompe un fragmento del menisco. El menisco es una pieza de Database administrator grueso y gomoso en forma de cua que se encuentra en la rodilla. Cada rodilla tiene [employer] el hueso superior (fmur) y el inferior (tibia) que forman la articulacin de la rodilla. Cada menisco acta como un amortiguador de la rodilla. La ruptura de meniscos es una lesin comn de la rodilla, y va de leve a grave. Para reparar Ardelia Mems rotura grave, tal vez haya que realizar Clementeen Hoof. Cules son las causas? Esta afeccin puede ser causada por los movimientos de arrodillarse, ponerse en cuclillas, rotar o girar [employer]. Por lo general, las rupturas de meniscos suelen ser causadas por lesiones deportivas, a menudo debido a las siguientes acciones: Optometrist y Secretary/administrator de Geographical information systems officer repentina. Cambiar de direccin. Recibir un tacle o ser derribado. Levantar o cargar objetos pesados. A medida que las personas envejecen, los meniscos se vuelven ms finos y ms dbiles. Las rupturas pueden producirse con mayor facilidad [employer], por ejemplo, al subir escaleras. Qu incrementa el riesgo? Es ms probable que tenga esta afeccin si: Practica deportes de contacto. Tiene un trabajo que requiere arrodillarse o ponerse en cuclillas. Es hombre. Es mayor de 69 aos. Cules son los signos o sntomas? Los sntomas de esta afeccin incluyen: Dolor en la rodilla, especialmente al costado de la articulacin. Puede sentir dolor inmediatamente despus de la lesin, o escuchar un chasquido y sentir dolor ms tarde. Sensacin de que la rodilla cruje, se engancha, se traba o se afloja (debilidad, inestabilidad). Imposibilidad de flexionar o extender la rodilla por completo. Moretones o hinchazn en la rodilla. Cmo se diagnostica? Esta afeccin se puede diagnosticar en funcin de los sntomas y de  un examen fsico. Tambin pueden hacerle estudios, por ejemplo: Radiografas. Resonancia magntica (RM). Artroscopa. Este es un procedimiento para examinar el interior de la rodilla con un pequeo endoscopio quirrgico. Pueden derivarlo a un especialista en rodilla (cirujano ortopdico). Cmo se trata? El tratamiento depende de la gravedad de la lesin. El tratamiento para una rotura leve puede incluir lo siguiente: Descanse. Medicamento para reducir Conservation officer, historic buildings y la hinchazn, normalmente un antiinflamatorio no esteroideos (AINE), como ibuprofeno. Un dispositivo ortopdico, una rodillera o una venda. Usar [employer] o andador para quitar peso de la rodilla y ayudarlo a Writer. Ejercicios para fortalecer la rodilla (fisioterapia). Tal vez deba someterse a una ciruga si la ruptura es grave o si otros tratamientos fracasan. Siga estas instrucciones en su casa: Si tiene un dispositivo ortopdico, una rodillera o una venda: selos como se lo haya indicado el mdico. Quteselos solamente como se lo haya indicado el mdico. Afloje el dispositivo ortopdico, rodillera o venda si los dedos de los pies se le adormecen, siente hormigueos o se le enfran y se tornan de Optician, dispensing. Mantenga limpio el dispositivo ortopdico, la rodillera o la venda. Si el dispositivo ortopdico, rodillera o venda no son impermeables: No deje que se mojen. Cbralos con un envoltorio hermtico cuando tome un bao de inmersin o una ducha. Control del dolor, la rigidez y la hinchazn  Use los medicamentos de venta libre y los recetados solamente como se lo haya indicado el mdico. Si se lo indican, aplique hielo sobre la rodilla. Para hacer esto: Si tiene un dispositivo ortopdico, rodillera o venda desmontable, quteselo como se lo haya indicado el mdico. Ponga el hielo en [place]  bolsa plstica. Coloque una toalla entre la piel y Therapist, nutritional. Coloque el hielo durante 20 minutos, 2 a 3 veces al da. Retire el hielo si la piel  se pone de color rojo brillante. Esto es [employer]. Si no puede sentir dolor, calor o fro, tiene un mayor riesgo de que se dae la zona. Mueva los dedos del pie con frecuencia para reducir la rigidez y la hinchazn. Cuando est sentado o acostado, levante (eleve) la zona lesionada por encima del nivel del corazn. Actividad No apoye el peso del cuerpo sobre la extremidad [employer] que lo autorice el mdico. Use muletas o un andador como se lo haya indicado el mdico. Retome sus actividades normales segn lo indicado por el mdico. Pregntele al mdico qu actividades son seguras para usted. Haga ejercicios de flexibilidad solamente como se lo haya indicado el mdico. Comience a Field seismologist los ejercicios para fortalecer la rodilla y los msculos de la pierna solamente como se lo haya indicado el mdico. Despus de que se recupere, el mdico puede recomendarle estos ejercicios para ayudar a Product/process development scientist que se produzca otra lesin. Instrucciones generales Use un dispositivo ortopdico, una rodillera o una venda como se lo haya indicado el mdico. Pregntele al mdico cundo puede volver a conducir si tiene un dispositivo ortopdico, una rodillera o una venda en la pierna. No consuma ningn producto que contenga nicotina o tabaco, como cigarrillos, cigarrillos electrnicos y tabaco de Higher education careers adviser. Si necesita ayuda para dejar de fumar, consulte al [employer]. Pregntele al mdico si el medicamento recetado: Hace necesario que evite conducir o usar maquinaria pesada. Puede causarle estreimiento. Es posible que tenga que tomar estas medidas para prevenir o tratar el estreimiento: Electronics engineer suficiente lquido como para Theatre manager la orina de color amarillo plido. Usar medicamentos recetados o de Radio broadcast assistant. Consumir alimentos ricos en fibra, como frijoles, cereales integrales, y frutas y verduras frescas. Limitar el consumo de alimentos ricos en grasa y azcares procesados, como los alimentos fritos o  dulces. Cumpla con todas las visitas de seguimiento. Esto es importante. Comunquese con un mdico si: Tiene fiebre. La rodilla est enrojecida, hinchada o le duele. No puede controlar el dolor con los analgsicos. Los sntomas empeoran o no mejoran despus de 2 semanas de Multimedia programmer. Resumen La ruptura de meniscos es una lesin en la rodilla que se produce cuando se rompe un fragmento del menisco. El tratamiento depende de la gravedad de la lesin. Tal vez deba someterse a una ciruga si la ruptura es grave o si otros tratamientos fracasan. Haga reposo, aplquese hielo y levante (eleve) la rodilla lesionada como se lo haya indicado el mdico. Esto ayudar a Therapist, occupational y la inflamacin. Comunquese con un mdico si tiene sntomas nuevos, si sus sntomas empeoran o si no mejoran despus de 2 semanas de cuidados en el hogar. Cumpla con todas las visitas de seguimiento. Esto es importante. Esta informacin no tiene Marine scientist el consejo del mdico. Asegrese de hacerle al mdico cualquier pregunta que tenga. Document Revised: 09/09/2019 Document Reviewed: 09/09/2019 Elsevier Patient Education  [place].

## 2021-05-21 NOTE — Progress Notes (Signed)
My knee hurts.  She is accompanied by an interpreter.  She has pain of the left knee, medially.  It swells and gives way.  She had MRI of the left knee showing: IMPRESSION: 1. Degeneration of the posterior horn and body of the medial meniscus. Peripheral meniscal extrusion as can be seen with loss of hoop strength. Small oblique tear of the free edge of the posterior horn of the medial meniscus extending to the inferior articular surface. 2. Tricompartmental cartilage abnormalities as described above.  I have explained the findings.  She may need arthroscopy of the left knee.  I will have her see Dr. Amedeo Kinsman or Dr. Aline Brochure.  She is agreeable to this.  I have independently reviewed the MRI.    Left knee has slight effusion, crepitus, ROM 0 to 110, pain medially, positive medial [place], slight limp left, NV intact, no distal edema.  Encounter Diagnosis  Name Primary?   Chronic pain of left knee Yes   To see Dr. Loletha Grayer or Dr. Lemmie Evens.  Call if any problem.  Precautions discussed.  Electronically Signed Sanjuana Kava, MD 2/21/20239:09 AM

## 2021-05-29 ENCOUNTER — Ambulatory Visit: Payer: Medicare HMO | Admitting: Orthopedic Surgery

## 2021-05-29 ENCOUNTER — Encounter: Payer: Self-pay | Admitting: Orthopedic Surgery

## 2021-05-29 ENCOUNTER — Other Ambulatory Visit: Payer: Self-pay

## 2021-05-29 VITALS — BP 117/79 | HR 87 | Ht 61.0 in | Wt 163.0 lb

## 2021-05-29 DIAGNOSIS — M1712 Unilateral primary osteoarthritis, left knee: Secondary | ICD-10-CM | POA: Diagnosis not present

## 2021-05-29 DIAGNOSIS — M23322 Other meniscus derangements, posterior horn of medial meniscus, left knee: Secondary | ICD-10-CM | POA: Diagnosis not present

## 2021-05-29 NOTE — Addendum Note (Signed)
Addended by: Carole Civil on: 05/29/2021 02:52 PM ? ? Modules accepted: Orders, SmartSet ? ?

## 2021-05-29 NOTE — Progress Notes (Signed)
Sheri Rice ? ?05/29/2021 ? ?Body mass index is 30.8 kg/m?. ? ?ASSESSMENT AND PLAN:    ? ?Encounter Diagnoses  ?Name Primary?  ? Primary osteoarthritis of left knee Yes  ? Derangement of posterior horn of medial meniscus of left knee   ? ? ?The procedure has been fully reviewed with the patient; The risks and benefits of surgery have been discussed and explained and understood. Alternative treatment has also been reviewed, questions were encouraged and answered. The postoperative plan is also been reviewed. ? ?We discussed arthroscopy versus total knee arthroplasty.  The patient still has a reasonable amount of cartilage left based on plain film and her alignment is normal.  She opted to have arthroscopy and medial meniscectomy with or without chondroplasty ? ?Interpreter was present ? ?Chief Complaint  ?Patient presents with  ? Knee Pain  ?  Left, to discuss surgery  ? ?HPI ? ? ? ?HISTORY SECTION : ? ? ?ROS ? ? has a past medical history of GERD (gastroesophageal reflux disease) and Hyperlipidemia.  ? ?Past Surgical History:  ?Procedure Laterality Date  ? DILATION AND CURETTAGE OF UTERUS    ? ? ?Social History  ? ?Socioeconomic History  ? Marital status: Married  ?  Spouse name: Not on file  ? Number of children: 3  ? Years of education: 28  ? Highest education level: Not on file  ?Occupational History  ? 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   ?Tobacco Use  ? Smoking status: Never  ? Smokeless tobacco: Never  ?Vaping Use  ? Vaping Use: Never used  ?Substance and Sexual Activity  ? Alcohol use: No  ?  Alcohol/week: 0.0 standard drinks  ? Drug use: Never  ? Sexual activity: Yes  ?  Birth control/protection: Post-menopausal  ?Other Topics Concern  ? Not on file  ?Social History Narrative  ? Pt is retired, Married , lives with her husband   ? ?Social Determinants of Health  ? ?Financial Resource Strain: Not on file  ?Food Insecurity: Not on file  ?Transportation Needs: Not on file  ?Physical Activity: Not on file  ?Stress: Not on  file  ?Social Connections: Not on file  ?Intimate Partner Violence: Not on file  ? ? ? ?Family History  ?Problem Relation Age of Onset  ? Diabetes Mother   ? Depression Mother   ? Diabetes type II Sister   ? Hypertension Sister   ? Stroke Maternal Grandmother   ? Colon cancer Neg Hx   ? Liver cancer Neg Hx   ? Breast cancer Neg Hx   ? Cervical cancer Neg Hx   ? ? ? ? ?No Known Allergies ? ? ?Current Outpatient Medications:  ?  acetaminophen (TYLENOL) 500 MG tablet, Take 2 tablets (1,000 mg total) by mouth every 8 (eight) hours as needed., Disp: , Rfl:  ?  Calcium Carb-Cholecalciferol (CALTRATE 600+D3) 600-20 MG-MCG TABS, Take 600 mg by mouth 2 (two) times daily., Disp: 60 tablet, Rfl: 6 ?  Cholecalciferol (VITAMIN D3 PO), Take 5,000 Units by mouth daily., Disp: , Rfl:  ?  levothyroxine (SYNTHROID) 100 MCG tablet, Take 1 tablet (100 mcg total) by mouth daily., Disp: 90 tablet, Rfl: 3 ?  magnesium gluconate (MAGONATE) 500 MG tablet, Take 250 mg by mouth daily., Disp: , Rfl:  ?  Multiple Vitamins-Minerals (MULTIVITAMIN WITH MINERALS) tablet, Take 1 tablet by mouth daily. (Nature-Made Brand), Disp: , Rfl:  ?  omeprazole (PRILOSEC) 20 MG capsule, Take 1 capsule (20 mg total) by mouth daily., Disp: 90 capsule,  Rfl: 3 ?  Potassium 99 MG TABS, Take 50 mg by mouth daily., Disp: , Rfl:  ?  promethazine-dextromethorphan (PROMETHAZINE-DM) 6.25-15 MG/5ML syrup, Take 5 mLs by mouth 4 (four) times daily as needed., Disp: 100 mL, Rfl: 0 ? ?Current Facility-Administered Medications:  ?  0.9 %  sodium chloride infusion, 500 mL, Intravenous, Once, Sheri Shipper, MD ? ? ?PHYSICAL EXAM SECTION: ?BP 117/79   Pulse 87   Ht 5\' 1"  (1.549 m)   Wt 163 lb (73.9 kg)   BMI 30.80 kg/m?   Body mass index is 30.8 kg/m?. ? ? ?General appearance: Well-developed well-nourished no gross deformities ? ?Eyes clear normal vision no evidence of conjunctivitis or jaundice, extraocular muscles intact ? ?ENT: ears hearing normal, nasal passages clear,  throat clear  ? ?Neck is supple without palpable mass, full range of motion ? ? ?Cardiovascular normal pulse and perfusion in all 4 extremities normal color without edema ? ?Lymph nodes: No lymphadenopathy ? ?Neurologically deep tendon reflexes are equal and normal, no sensation loss or deficits no pathologic reflexes ? ? ?Skin no lacerations or ulcerations no nodularity no palpable masses, no erythema or nodularity ? ?Psychological: Awake alert and oriented x3 mood and affect normal ? ?Musculoskeletal: Skin over the left knee looks normal and good for surgery.  Tenderness along the medial and lateral joint line and the lateral joint line actually seem to have more tenderness.  Range of motion 0-1 25.  Stability test normal muscle strength and normal ? ?Imaging: I personally read the images and my interpretation is MRI 3 compartment disease moderate depending on which compartment degenerative meniscal tear with some extrusion posterior horn medial meniscus ? ?Plain films again reasonable alignment in the knee no varus or excessive valgus with mild narrowing medial compartment without secondary bone changes ? ?1:30 PM ? ?Sheri Rice ? ? ?

## 2021-06-01 LAB — CMP14+EGFR
ALT: 16 IU/L (ref 0–32)
AST: 17 IU/L (ref 0–40)
Albumin/Globulin Ratio: 1.6 (ref 1.2–2.2)
Albumin: 4 g/dL (ref 3.8–4.8)
Alkaline Phosphatase: 109 IU/L (ref 44–121)
BUN/Creatinine Ratio: 17 (ref 12–28)
BUN: 12 mg/dL (ref 8–27)
Bilirubin Total: 0.3 mg/dL (ref 0.0–1.2)
CO2: 21 mmol/L (ref 20–29)
Calcium: 9.3 mg/dL (ref 8.7–10.3)
Chloride: 104 mmol/L (ref 96–106)
Creatinine, Ser: 0.7 mg/dL (ref 0.57–1.00)
Globulin, Total: 2.5 g/dL (ref 1.5–4.5)
Glucose: 98 mg/dL (ref 70–99)
Potassium: 3.8 mmol/L (ref 3.5–5.2)
Sodium: 141 mmol/L (ref 134–144)
Total Protein: 6.5 g/dL (ref 6.0–8.5)
eGFR: 96 mL/min/{1.73_m2} (ref >59)

## 2021-06-01 LAB — LIPID PANEL
Chol/HDL Ratio: 3.2 ratio (ref 0.0–4.4)
Cholesterol, Total: 153 mg/dL (ref 100–199)
HDL: 48 mg/dL (ref >39)
LDL Chol Calc (NIH): 75 mg/dL (ref 0–99)
Triglycerides: 178 mg/dL — ABNORMAL HIGH (ref 0–149)
VLDL Cholesterol Cal: 30 mg/dL (ref 5–40)

## 2021-06-01 LAB — TSH: TSH: 0.438 u[IU]/mL — ABNORMAL LOW (ref 0.450–4.500)

## 2021-06-01 NOTE — Progress Notes (Signed)
I will discuss results with patient during her next follow-up visit.  Low TSH, hypothyroidism being over treated . currently taking Synthroid 100 mcg daily I plan to decrease Synthroid to 88 a MCG daily and recheck labs in 6 weeks.  Other labs are stable

## 2021-06-04 ENCOUNTER — Other Ambulatory Visit: Payer: Self-pay

## 2021-06-04 ENCOUNTER — Encounter: Payer: Self-pay | Admitting: Nurse Practitioner

## 2021-06-04 ENCOUNTER — Ambulatory Visit (INDEPENDENT_AMBULATORY_CARE_PROVIDER_SITE_OTHER): Payer: Medicare HMO | Admitting: Nurse Practitioner

## 2021-06-04 VITALS — BP 124/85 | HR 83 | Ht 61.0 in | Wt 162.0 lb

## 2021-06-04 DIAGNOSIS — E782 Mixed hyperlipidemia: Secondary | ICD-10-CM

## 2021-06-04 DIAGNOSIS — E039 Hypothyroidism, unspecified: Secondary | ICD-10-CM

## 2021-06-04 DIAGNOSIS — R21 Rash and other nonspecific skin eruption: Secondary | ICD-10-CM | POA: Diagnosis not present

## 2021-06-04 MED ORDER — LEVOTHYROXINE SODIUM 75 MCG PO TABS
75.0000 ug | ORAL_TABLET | Freq: Every day | ORAL | 1 refills | Status: DC
Start: 2021-06-04 — End: 2021-07-30

## 2021-06-04 NOTE — Assessment & Plan Note (Signed)
Lab Results  ?Component Value Date  ? TSH 0.438 (L) 05/31/2021  ?Chronic condition ?Patient has been taking Synthroid 100 mcg ?Patient told to stop taking Synthroid 100 mcg daily, start Synthroid 75 mcg daily, check TSH in 4 weeks ? ?

## 2021-06-04 NOTE — Assessment & Plan Note (Signed)
Lab Results  ?Component Value Date  ? CHOL 153 05/31/2021  ? HDL 48 05/31/2021  ? [place] 75 05/31/2021  ? LDLDIRECT 108.0 12/18/2020  ? TRIG 178 (H) 05/31/2021  ? CHOLHDL 3.2 05/31/2021  ?Patient has no diabetes LDL 75 ?Trig elevated patient told to avoid simple carbohydrates alcohol she verbalized understanding ?The 10-year ASCVD risk score (Arnett DK, et al., 2019) is: 4.8% ?  Values used to calculate the score: ?    Age: 66 years ?    Sex: Female ?    Is Non-Hispanic African American: No ?    Diabetic: No ?    Tobacco smoker: No ?    Systolic Blood Pressure: 836 mmHg ?    Is BP treated: No ?    HDL Cholesterol: 48 mg/dL ?    Total Cholesterol: 153 mg/dL ? ?

## 2021-06-04 NOTE — Progress Notes (Signed)
? ?  Sheri Rice     MRN: [id number]      DOB: 05-Mar-1956 ? ? ?HPI ?Sheri Rice with medical history of hypertension, GERD, hyperlipidemia, hypothyroidism is here for follow up HLD, hypothyroidism . patient was accompanied to today's visit by a [place] interpreter. ? ?Takes levothyroxine 100 mcg daily for hypothyroidism, patient denies palpitation diarrhea, racing heart beat ? ?Pt c/o rashes on her face that she has had for  months, she, she thought  that rashes was due to taking cholesterol medication she had stopped taking cholesterol medication months ago.  Patient stated that she saw dermatologist she was given anointment but it has not helped her rashes completely.  Patient denies itching burning sensation ? ? ?ROS ?Denies recent fever or chills. ?Denies sinus pressure, nasal congestion, ear pain or sore throat. ?Denies chest congestion, productive cough or wheezing. ?Denies chest pains, palpitations and leg swelling ?Denies abdominal pain, nausea, vomiting,diarrhea or constipation.   ?Denies dysuria, frequency, hesitancy or incontinence. ?Denies joint pain, swelling and limitation in mobility. ?Denies headaches, seizures, numbness, or tingling. ?Denies depression, anxiety or insomnia. ? ? ? ?PE ? ?BP 124/85 (BP Location: Right Arm, Patient Position: Sitting, Cuff Size: Large)   Pulse 83   Ht '5\' 1"'$  (1.549 m)   Wt 162 lb (73.5 kg)   SpO2 95%   BMI 30.61 kg/m?  ? ?Patient alert and oriented and in no cardiopulmonary distress. ? ?Chest: Clear to auscultation bilaterally. ? ?CVS: S1, S2 no murmurs, no S3.Regular rate. ? ?ABD: Soft non tender.  ? ?Ext: No edema ? ?MS: Adequate ROM spine, shoulders, hips and knees. ? ?Skin: Intact, no ulcerations or rash noted. ? ?Psych: Good eye contact, normal affect. Memory intact not anxious or depressed appearing. ? ?CNS: CN 2-12 intact, power,  normal throughout.no focal deficits noted. ? ? ?Assessment & Plan ?Hypothyroidism ?Lab Results  ?Component Value Date  ?  TSH 0.438 (L) 05/31/2021  ?Chronic condition ?Patient has been taking Synthroid 100 mcg ?Patient told to stop taking Synthroid 100 mcg daily, start Synthroid 75 mcg daily, check TSH in 4 weeks ? ? ?HYPERLIPIDEMIA, MIXED, MILD ?Lab Results  ?Component Value Date  ? CHOL 153 05/31/2021  ? HDL 48 05/31/2021  ? [place] 75 05/31/2021  ? LDLDIRECT 108.0 12/18/2020  ? TRIG 178 (H) 05/31/2021  ? CHOLHDL 3.2 05/31/2021  ?Patient has no diabetes LDL 75 ?Trig elevated patient told to avoid simple carbohydrates alcohol she verbalized understanding ?The 10-year ASCVD risk score (Arnett DK, et al., 2019) is: 4.8% ?  Values used to calculate the score: ?    Age: 8 years ?    Sex: Female ?    Is Non-Hispanic African American: No ?    Diabetic: No ?    Tobacco smoker: No ?    Systolic Blood Pressure: 035 mmHg ?    Is BP treated: No ?    HDL Cholesterol: 48 mg/dL ?    Total Cholesterol: 153 mg/dL ? ? ?Rash and other nonspecific skin eruption ?No rashes noted on examination today. ?Patient told to report new rashes she verbalized understanding ? ? ?

## 2021-06-04 NOTE — Patient Instructions (Signed)
Please take levothyroxine 46mg daily for hypothyroidism.  ?Please get your labs doen 3-5 days before your next follow up. ? ?It is important that you exercise regularly at least 30 minutes 5 times a week.  ?Think about what you will eat, plan ahead. ?Choose " clean, green, fresh or frozen" over canned, processed or packaged foods which are more sugary, salty and fatty. ?70 to 75% of food eaten should be vegetables and fruit. ?Three meals at set times with snacks allowed between meals, but they must be fruit or vegetables. ?Aim to eat over a 12 hour period , example 7 am to 7 pm, and STOP after  your last meal of the day. ?Drink water,generally about 64 ounces per day, no other drink is as healthy. Fruit juice is best enjoyed in a healthy way, by EATING the fruit. ? ?Thanks for choosing [facility], we consider it a privelige to serve you. ? ?

## 2021-06-04 NOTE — Assessment & Plan Note (Signed)
No rashes noted on examination today. ?Patient told to report new rashes she verbalized understanding ?

## 2021-06-28 ENCOUNTER — Telehealth: Payer: Self-pay | Admitting: Orthopedic Surgery

## 2021-06-28 ENCOUNTER — Other Ambulatory Visit: Payer: Self-pay

## 2021-06-28 DIAGNOSIS — M1712 Unilateral primary osteoarthritis, left knee: Secondary | ICD-10-CM

## 2021-06-28 DIAGNOSIS — M23322 Other meniscus derangements, posterior horn of medial meniscus, left knee: Secondary | ICD-10-CM

## 2021-06-28 NOTE — Telephone Encounter (Signed)
Noted  

## 2021-06-28 NOTE — Telephone Encounter (Signed)
Call received from [facility] durable medical equipment, ph [phone] - relaying that patient contacted them to let them know she has a walker, and does not need this one. ?

## 2021-07-13 LAB — TSH+FREE T4
Free T4: 1.27 ng/dL (ref 0.82–1.77)
TSH: 2.27 u[IU]/mL (ref 0.450–4.500)

## 2021-07-15 NOTE — Progress Notes (Signed)
Normal TSH. Will discuss results with pt tomorrow

## 2021-07-16 ENCOUNTER — Ambulatory Visit (INDEPENDENT_AMBULATORY_CARE_PROVIDER_SITE_OTHER): Payer: Medicare HMO | Admitting: Nurse Practitioner

## 2021-07-16 ENCOUNTER — Encounter: Payer: Self-pay | Admitting: Nurse Practitioner

## 2021-07-16 VITALS — BP 125/79 | HR 74 | Ht 61.0 in | Wt 163.0 lb

## 2021-07-16 DIAGNOSIS — E669 Obesity, unspecified: Secondary | ICD-10-CM

## 2021-07-16 DIAGNOSIS — J029 Acute pharyngitis, unspecified: Secondary | ICD-10-CM

## 2021-07-16 DIAGNOSIS — Z139 Encounter for screening, unspecified: Secondary | ICD-10-CM

## 2021-07-16 DIAGNOSIS — E039 Hypothyroidism, unspecified: Secondary | ICD-10-CM

## 2021-07-16 LAB — POCT RAPID STREP A (OFFICE): Rapid Strep A Screen: NEGATIVE

## 2021-07-16 NOTE — Assessment & Plan Note (Addendum)
Chronic condition,could be due to GERD on omeprazole for GERD, also has history of globus hystericus ?Saw GI in 2019, there was plan for EGD , pt did not follow up with the plan  ?GI referal placed today ?Strep test negative ?Take tylenol as needed for throat pain, drink warm fluids  ?

## 2021-07-16 NOTE — Assessment & Plan Note (Signed)
Wt Readings from Last 3 Encounters:  ?07/16/21 163 lb (73.9 kg)  ?06/04/21 162 lb (73.5 kg)  ?05/29/21 163 lb (73.9 kg)  ?No change in weight since last visit ?Patient encouraged to increase intake of whole foods consisting mainly of vegetables, protein, less carbohydrate engage in 30 minutes daily moderate exercise, importance of portion control also discussed with patient.  She verbalized understanding ?

## 2021-07-16 NOTE — Patient Instructions (Signed)

## 2021-07-16 NOTE — Progress Notes (Signed)
? ?  Sheri Rice     MRN: [id number]      DOB: May 23, 1955 ? ? ?HPI ?Sheri Rice with past medical history of hypothyroidism , GERD,ostearthritis  is here for follow up for hypothyroidism. She has been taking levothyroxine 26mg tablets daily since last visit  ? ?Pt c/o chronic sore throat and trouble swallowing worse at night since the past two years symptoms have been worse since the last 2-3 weeks , feels like something is stuck in her throat when she last down.  Pt denies fever, chills, nausea, vomiting, HA .  Has  acid reflux well controled on omeprazole '20mg'$  daily , rated her throat pain as 4/10. She saw GI in the past about this , was supposed to have EGD done but never followed up , probably due to the pandemic  ? ?The PT denies any adverse reactions to current medications since the last visit.  ? ?ROS ?Denies recent fever or chills. ?Denies sinus pressure, nasal congestion, ear pain or sore throat. ?Denies chest congestion, productive cough or wheezing. ?Denies chest pains, palpitations and leg swelling ?Denies abdominal pain, nausea, vomiting,diarrhea or constipation.   ?Denies headaches, seizures, numbness, or tingling. ?Denies depression, anxiety or insomnia. ? ? ?PE ? ?BP 125/79 (BP Location: Right Arm, Patient Position: Sitting, Cuff Size: Large)   Pulse 74   Ht '5\' 1"'$  (1.549 m)   Wt 163 lb (73.9 kg)   SpO2 97%   BMI 30.80 kg/m?  ? ?Patient alert and oriented and in no cardiopulmonary distress. ? ?HEENT: No facial asymmetry, EOMI,     Neck supple ., throat moist and clear, no exudate, redness , ulcer noted.  ? ?Chest: Clear to auscultation bilaterally. ? ?CVS: S1, S2 no murmurs, no S3.Regular rate. ? ?ABD: Soft non tender.  ? ?Ext: No edema ? ?MS: Adequate ROM spine, shoulders, hips and knees. ? ?Psych: Good eye contact, normal affect. Memory intact not anxious or depressed appearing. ? ? ?Assessment & Plan ?Hypothyroidism ?Lab Results  ?Component Value Date  ? TSH 2.270 07/12/2021  ?Stable chronic  condition, well-controlled levothyroxine 75 mcg daily ?tolerating med well  ?CONTINUE levothyroxine 773m daily.  ?follw up in 6 months  ? ? ?Sore throat ?Chronic condition,could be due to GERD on omeprazole for GERD, also has history of globus hystericus ?Saw GI in 2019, there was plan for EGD , pt did not follow up with the plan  ?GI referal placed today ?Strep test negative ?Take tylenol as needed for throat pain, drink warm fluids  ? ?Obesity (BMI 30-39.9) ?Wt Readings from Last 3 Encounters:  ?07/16/21 163 lb (73.9 kg)  ?06/04/21 162 lb (73.5 kg)  ?05/29/21 163 lb (73.9 kg)  ?No change in weight since last visit ?Patient encouraged to increase intake of whole foods consisting mainly of vegetables, protein, less carbohydrate engage in 30 minutes daily moderate exercise, importance of portion control also discussed with patient.  She verbalized understanding  ? ?

## 2021-07-16 NOTE — Assessment & Plan Note (Addendum)
Lab Results  ?Component Value Date  ? TSH 2.270 07/12/2021  ?Stable chronic condition, well-controlled levothyroxine 75 mcg daily ?tolerating med well  ?CONTINUE levothyroxine 28mg daily.  ?follw up in 6 months  ? ?

## 2021-07-17 ENCOUNTER — Telehealth: Payer: Self-pay

## 2021-07-17 NOTE — Telephone Encounter (Signed)
Spoke with Sheri Rice. She states a pre-cert is needed. Will call her back after pre-cert completed. Previously [facility] site would not allow pre-cert because it stated that this cpt code doesn't need pre-cert ?

## 2021-07-17 NOTE — Telephone Encounter (Signed)
Kim from [employer] left message needing to speak to Dr. Ruthe Mannan assistant in reference to an authorization for this patient. ?Patient is scheduled for surgery on 4/26. Kim's direct line is [phone] ?

## 2021-07-17 NOTE — Telephone Encounter (Signed)
Spoke with Sheri Rice. CPT [phone] has been approved.  ?

## 2021-07-18 ENCOUNTER — Encounter (HOSPITAL_COMMUNITY)
Admission: RE | Admit: 2021-07-18 | Discharge: 2021-07-18 | Disposition: A | Discharge status: Home / Self Care | Payer: Medicare HMO | Source: Ambulatory Visit | Attending: Orthopedic Surgery | Admitting: Orthopedic Surgery

## 2021-07-18 ENCOUNTER — Encounter (HOSPITAL_COMMUNITY): Payer: Self-pay

## 2021-07-18 DIAGNOSIS — Z01812 Encounter for preprocedural laboratory examination: Secondary | ICD-10-CM | POA: Insufficient documentation

## 2021-07-18 NOTE — Patient Instructions (Addendum)
? ? ? ? ? ? ? ? Sheri Rice ? 07/18/2021  ?  ? '@PREFPERIOPPHARMACY'$ @ ? ? Your procedure is scheduled on  07/23/2021. ? ? Report to [facility] at  0700  A.M. ? ? Call this number if you have problems the morning of surgery: ? [phone] ? ? Remember: ? Do not eat after midnight. ? ? ? You may drink clear liquids until 0445 AM 07/23/2021 .   ? ? ?Clear liquids allowed are:                    Water, Juice (non-citric and without pulp - diabetics please choose diet or no sugar options), Carbonated beverages - (diabetics please choose diet or no sugar options), Clear Tea, Black Coffee only (no creamer, milk or cream including half and half), Plain Jell-O only (diabetics please choose diet or no sugar options), Gatorade (diabetics please choose diet or no sugar options), and Plain Popsicles only ?  ? ?  At 0445 am on 07/23/2021, drink your carb drink. You can have nothing else to drink after this. ? ? ? Take these medicines the morning of surgery with A SIP OF WATER  levothyroxine, prilosec. ?  ? Do not wear jewelry, make-up or nail polish. ? Do not wear lotions, powders, or perfumes, or deodorant. ? Do not shave 48 hours prior to surgery.  Men may shave face and neck. ? Do not bring valuables to the hospital. ? [facility] is not responsible for any belongings or valuables. ? ?Contacts, dentures or bridgework may not be worn into surgery.  Leave your suitcase in the car.  After surgery it may be brought to your room. ? ?For patients admitted to the hospital, discharge time will be determined by your treatment team. ? ?Patients discharged the day of surgery will not be allowed to drive home and must have someone with them for 24 hours.  ? ? ?Special instructions:   DO NOT smoke tobacco or vape for 24 hours before your procedure. ? ?Please read over the following fact sheets that you were given. ?Coughing and Deep Breathing, Surgical Site Infection Prevention, Anesthesia Post-op Instructions, and Care and Recovery After  Surgery ?  ? ? ? Reparaci?n artrosc?pica del ligamento de la rodilla, cuidados posteriores ?Arthroscopic Knee Ligament Repair, Care After ?Esta hoja le brinda informaci?n sobre c?mo cuidarse despu?s del procedimiento. Su m?dico tambi?n podr? darle instrucciones m?s espec?ficas. Comun?quese con el m?dico si tiene problemas o preguntas. ??Qu? puedo esperar despu?s del procedimiento? ?Despu?s del procedimiento, es normal tener los siguientes s?ntomas: ?Molestias o dolor en la rodilla. ?Hematomas e hinchaz?n de la rodilla, la pantorrilla y el tobillo durante 3 a 4 d?as. ?Las incisiones segregan una peque?a cantidad de l?quido. ?Siga estas instrucciones en su casa: ?Medicamentos ?Use los medicamentos de venta libre y los recetados solamente como se lo haya indicado el m?dico. ?Preg?ntele al m?dico si el medicamento recetado: ?Hace necesario que evite conducir o Musician. ?Puede causarle estre?imiento. Es posible que tenga que tomar estas medidas para prevenir o tratar el estre?imiento: ?Electronics engineer suficiente l?quido como para Theatre manager la orina de color amarillo p?lido. ?Usar medicamentos recetados o de Radio broadcast assistant. ?Consumir alimentos ricos en fibra, como frijoles, cereales integrales, y frutas y verduras frescas. ?Limitar el consumo de alimentos ricos en grasa y az?cares procesados, como los alimentos fritos o dulces. ?Si tiene un dispositivo ortop?dico o un inmovilizador: ??selos como se lo haya indicado el m?dico. Qu?teselos solamente como se lo haya indicado  el m?dico. ?Afl?jelos si siente hormigueo en los dedos de los pies, si se le entumecen o se le enfr?an y se tornan de Optician, dispensing. ?Mant?ngalos limpios y secos. ?Preg?ntele al m?dico cu?ndo es seguro volver a conducir. ?Ba?arse ?No tome ba?os de inmersi?n, no nade ni use el jacuzzi hasta que el m?dico lo autorice. ?Craig (vendaje) secas hasta que el m?dico le diga que se lo puede quitar. ?Si el dispositivo ortop?dico o el inmovilizador no son  impermeables: ?No deje que se mojen. ?C?bralos con un envoltorio herm?tico cuando tome un ba?o de inmersi?n o una ducha. ?Cuidado de la incisi?n ? ?Siga las instrucciones del m?dico acerca del cuidado de las incisiones. Aseg?rese de hacer lo siguiente: ?L?vese las manos con agua y jab?n durante al menos 20 segundos antes y despu?s de cambiar el vendaje. Use desinfectante para manos si no dispone de agua y jab?n. ?Cambie el vendaje como se lo haya indicado el m?dico. ?No retire los puntos (suturas), la goma para cerrar la piel o las tiras [place]. Es posible que estos cierres cut?neos deban quedar puestos en la piel durante 2 semanas o m?s tiempo. Si los bordes de las tiras Raelyn Number empiezan a despegarse y Therapist, sports, puede recortar los que est?n sueltos. No retire las tiras [facility] por completo a menos que el m?dico se lo indique. ?Controle el lugar de la incisi?n todos los d?as para Hydrographic surveyor signos de infecci?n. Est? atento a los siguientes signos: ?Enrojecimiento. ?Mayor Geographical information systems officer. ?Mayor presencia de [place] o l?quido. ?[place]. ?Pus o mal olor. ?Control del dolor, la rigidez y la hinchaz?n ? ?Si se lo indican, aplique hielo sobre la zona afectada. Para hacer esto: ?Si tiene un dispositivo ortop?dico o un inmovilizador desmontable, qu?teselos como se lo haya indicado el m?dico. ?Ponga el hielo en una bolsa pl?stica. ?Coloque una [employer] piel y Therapist, nutritional. ?Aplique el hielo durante 20 minutos, 2 o 3 veces por d?a. ?Retire el hielo si la piel se pone de color rojo brillante. Esto es [employer]. Si no puede sentir dolor, calor o fr?o, tiene un mayor riesgo de que se da?e la zona. ?Mueva los dedos del pie con frecuencia para reducir la rigidez y la hinchaz?n. ?Cuando est? sentado o acostado, alce (eleve) la zona de la lesi?n por encima del nivel del coraz?n. ?Actividad ?No apoye el peso del cuerpo en la rodilla hasta que el m?dico lo autorice. Use muletas u otros dispositivos como se lo haya  indicado el m?dico. ?Haga los ejercicios de fisioterapia como se lo haya indicado el m?dico. La fisioterapia puede ayudarlo a recuperar el movimiento y la fuerza de la rodilla. ?Siga las instrucciones del m?dico acerca de lo siguiente: ?Cu?ndo puede comenzar a Optometrist ejercicios de amplitud de movimiento. ?Cu?ndo puede comenzar a Geographical information systems officer fija y Field seismologist otras actividades de bajo impacto. ?Cu?ndo puede comenzar a trotar y Field seismologist otras actividades de alto impacto. ?No levante ning?n objeto que pese m?s de 10 libras (4.5 kg) o que supere el l?mite de [employer] hayan indicado, hasta que el m?dico le diga que puede [place]. ?Preg?ntele al m?dico qu? actividades son seguras para usted. ?Indicaciones generales ?No consuma ning?n producto que contenga nicotina o tabaco, como cigarrillos, cigarrillos electr?nicos y tabaco de Higher education careers adviser. Estos pueden retrasar la recuperaci?n. Si necesita ayuda para dejar de consumir estos productos, consulte al m?dico. ?Use medias de compresi?n como se lo haya indicado su m?dico. Estas medias ayudan a evitar la formaci?n de co?gulos de [place] y  a reducir la hinchaz?n de las piernas. ?[place]. Esto es importante. ?Comun?quese con un m?dico si: ?Tiene cualquiera de estos signos de infecci?n: ?Enrojecimiento alrededor de una incisi?n. ?Sangre o m?s l?quido de Audiological scientist de una incisi?n. ?Calor proveniente de una incisi?n. ?Pus o mal olor provenientes de la incisi?n. ?M?s hinchaz?n o dolor en la rodilla. ?Cristy Hilts o escalofr?os. ?Su dolor no se alivia con medicamentos. ?La incisi?n se abre. ?Solicite ayuda de inmediato si: ?Tiene dificultad para respirar. ?Siente dolor en el pecho. ?Tiene m?s dolor o hinchaz?n en la pantorrilla o en la parte posterior de la rodilla. ?Tiene adormecimiento y hormigueo cerca de la articulaci?n de la rodilla o en el pie, el tobillo o los dedos del pie. ?Nota que el pie o los dedos de los pies tienen un aspecto m?s oscuro de lo normal  o est?n m?s fr?os de lo normal. ?Estos s?ntomas pueden representar un problema grave que constituye una emergencia. No espere a ver si los s?ntomas desaparecen. Solicite atenci?n m?dica de inmediato. Comun?ques

## 2021-07-22 NOTE — H&P (Signed)
?Body mass index is 30.8 kg/m?. ?  ?ASSESSMENT AND PLAN:    ?  ?    ?Encounter Diagnoses  ?Name Primary?  ? Primary osteoarthritis of left knee Yes  ? Derangement of posterior horn of medial meniscus of left knee    ?  ?  ?The procedure has been fully reviewed with the patient; The risks and benefits of surgery have been discussed and explained and understood. Alternative treatment has also been reviewed, questions were encouraged and answered. The postoperative plan is also been reviewed. ?  ?We discussed arthroscopy versus total knee arthroplasty.  The patient still has a reasonable amount of cartilage left based on plain film and her alignment is normal.  She opted to have arthroscopy and medial meniscectomy with or without chondroplasty ? ?Interpreter was present ?  ?    ?Chief Complaint  ?Patient presents with  ? Knee Pain  ?    Left, to discuss surgery  ?  ?HPI ? ?66 year old female with painful left knee.  Patient states that the knee is starting to bother her and interfere with her activities of daily living she is having pain catching and locking consistent with possible torn medial meniscus ?  ?  ?  ?HISTORY SECTION : ?  ?  ?ROS ?  ? has a past medical history of GERD (gastroesophageal reflux disease) and Hyperlipidemia.  ?  ?     ?Past Surgical History:  ?Procedure Laterality Date  ? DILATION AND CURETTAGE OF UTERUS      ?  ?  ?Social History  ?  ?     ?Socioeconomic History  ? Marital status: Married  ?    Spouse name: Not on file  ? Number of children: 3  ? Years of education: 11  ? Highest education level: Not on file  ?Occupational History  ? 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   ?Tobacco Use  ? Smoking status: Never  ? Smokeless tobacco: Never  ?Vaping Use  ? Vaping Use: Never used  ?Substance and Sexual Activity  ? Alcohol use: No  ?    Alcohol/week: 0.0 standard drinks  ? Drug use: Never  ? Sexual activity: Yes  ?    Birth control/protection: Post-menopausal  ?Other Topics Concern  ? Not on file  ?Social  History Narrative  ?  Pt is retired, Married , lives with her husband   ?  ?Social Determinants of Health  ?  ?Financial Resource Strain: Not on file  ?Food Insecurity: Not on file  ?Transportation Needs: Not on file  ?Physical Activity: Not on file  ?Stress: Not on file  ?Social Connections: Not on file  ?Intimate Partner Violence: Not on file  ?  ?  ?  ?     ?Family History  ?Problem Relation Age of Onset  ? Diabetes Mother    ? Depression Mother    ? Diabetes type II Sister    ? Hypertension Sister    ? Stroke Maternal Grandmother    ? Colon cancer Neg Hx    ? Liver cancer Neg Hx    ? Breast cancer Neg Hx    ? Cervical cancer Neg Hx    ?  ?  ?  ?  ?No Known Allergies ?  ?  ?Current Outpatient Medications:  ?  acetaminophen (TYLENOL) 500 MG tablet, Take 2 tablets (1,000 mg total) by mouth every 8 (eight) hours as needed., Disp: , Rfl:  ?  Calcium Carb-Cholecalciferol (CALTRATE 600+D3) 600-20 MG-MCG TABS, Take 600  mg by mouth 2 (two) times daily., Disp: 60 tablet, Rfl: 6 ?  Cholecalciferol (VITAMIN D3 PO), Take 5,000 Units by mouth daily., Disp: , Rfl:  ?  levothyroxine (SYNTHROID) 100 MCG tablet, Take 1 tablet (100 mcg total) by mouth daily., Disp: 90 tablet, Rfl: 3 ?  magnesium gluconate (MAGONATE) 500 MG tablet, Take 250 mg by mouth daily., Disp: , Rfl:  ?  Multiple Vitamins-Minerals (MULTIVITAMIN WITH MINERALS) tablet, Take 1 tablet by mouth daily. (Nature-Made Brand), Disp: , Rfl:  ?  omeprazole (PRILOSEC) 20 MG capsule, Take 1 capsule (20 mg total) by mouth daily., Disp: 90 capsule, Rfl: 3 ?  Potassium 99 MG TABS, Take 50 mg by mouth daily., Disp: , Rfl:  ?  promethazine-dextromethorphan (PROMETHAZINE-DM) 6.25-15 MG/5ML syrup, Take 5 mLs by mouth 4 (four) times daily as needed., Disp: 100 mL, Rfl: 0 ?  ?Current Facility-Administered Medications:  ?  0.9 %  sodium chloride infusion, 500 mL, Intravenous, Once, Irene Shipper, MD ?  ?  ?PHYSICAL EXAM SECTION: ?BP 117/79   Pulse 87   Ht '5\' 1"'$  (1.549 m)   Wt 163  lb (73.9 kg)   BMI 30.80 kg/m?   Body mass index is 30.8 kg/m?. ?  ?  ?General appearance: Well-developed well-nourished no gross deformities ?  ?Eyes clear normal vision no evidence of conjunctivitis or jaundice, extraocular muscles intact ?  ?ENT: ears hearing normal, nasal passages clear, throat clear  ?  ?Neck is supple without palpable mass, full range of motion ?  ? ?Cardiovascular normal pulse and perfusion in all 4 extremities normal color without edema ?  ?Lymph nodes: No lymphadenopathy ? ?Neurologically deep tendon reflexes are equal and normal, no sensation loss or deficits no pathologic reflexes ?  ?  ?Skin no lacerations or ulcerations no nodularity no palpable masses, no erythema or nodularity ?  ?Psychological: Awake alert and oriented x3 mood and affect normal ?  ?Musculoskeletal: Skin over the left knee looks normal and good for surgery.  Tenderness along the medial and lateral joint line and the lateral joint line actually seem to have more tenderness.  Range of motion 0-1 25.  Stability test normal muscle strength and normal ?  ?Imaging: I personally read the images and my interpretation is MRI 3 compartment disease moderate depending on which compartment degenerative meniscal tear with some extrusion posterior horn medial meniscus ? ?Plain films again reasonable alignment in the knee no varus or excessive valgus with mild narrowing medial compartment without secondary bone changes ? ?1:04 PM ? ?07/22/2021 ? ?

## 2021-07-23 ENCOUNTER — Other Ambulatory Visit: Payer: Self-pay

## 2021-07-23 ENCOUNTER — Encounter (HOSPITAL_COMMUNITY): Payer: Self-pay | Admitting: Orthopedic Surgery

## 2021-07-23 ENCOUNTER — Ambulatory Visit (HOSPITAL_BASED_OUTPATIENT_CLINIC_OR_DEPARTMENT_OTHER): Payer: Medicare HMO | Admitting: Anesthesiology

## 2021-07-23 ENCOUNTER — Ambulatory Visit (HOSPITAL_COMMUNITY): Payer: Medicare HMO | Admitting: Anesthesiology

## 2021-07-23 ENCOUNTER — Ambulatory Visit (HOSPITAL_COMMUNITY)
Admission: RE | Admit: 2021-07-23 | Discharge: 2021-07-23 | Disposition: A | Discharge status: Home / Self Care | Payer: Medicare HMO | Source: Ambulatory Visit | Attending: Orthopedic Surgery | Admitting: Orthopedic Surgery

## 2021-07-23 ENCOUNTER — Encounter (HOSPITAL_COMMUNITY)
Admission: RE | Disposition: A | Discharge status: Home / Self Care | Payer: Self-pay | Source: Ambulatory Visit | Attending: Orthopedic Surgery

## 2021-07-23 DIAGNOSIS — E039 Hypothyroidism, unspecified: Secondary | ICD-10-CM | POA: Diagnosis not present

## 2021-07-23 DIAGNOSIS — X58XXXA Exposure to other specified factors, initial encounter: Secondary | ICD-10-CM | POA: Diagnosis not present

## 2021-07-23 DIAGNOSIS — M23322 Other meniscus derangements, posterior horn of medial meniscus, left knee: Secondary | ICD-10-CM

## 2021-07-23 DIAGNOSIS — M1990 Unspecified osteoarthritis, unspecified site: Secondary | ICD-10-CM | POA: Diagnosis not present

## 2021-07-23 DIAGNOSIS — S83242A Other tear of medial meniscus, current injury, left knee, initial encounter: Secondary | ICD-10-CM | POA: Diagnosis present

## 2021-07-23 DIAGNOSIS — M2242 Chondromalacia patellae, left knee: Secondary | ICD-10-CM | POA: Diagnosis not present

## 2021-07-23 DIAGNOSIS — M1712 Unilateral primary osteoarthritis, left knee: Secondary | ICD-10-CM | POA: Insufficient documentation

## 2021-07-23 DIAGNOSIS — M94262 Chondromalacia, left knee: Secondary | ICD-10-CM | POA: Insufficient documentation

## 2021-07-23 DIAGNOSIS — K219 Gastro-esophageal reflux disease without esophagitis: Secondary | ICD-10-CM | POA: Insufficient documentation

## 2021-07-23 HISTORY — PX: CHONDROPLASTY: SHX5177

## 2021-07-23 HISTORY — PX: KNEE ARTHROSCOPY WITH MEDIAL MENISECTOMY: SHX5651

## 2021-07-23 SURGERY — ARTHROSCOPY, KNEE, WITH MEDIAL MENISCECTOMY
Anesthesia: General | Site: Knee | Laterality: Left

## 2021-07-23 MED ORDER — ONDANSETRON HCL 4 MG/2ML IJ SOLN
INTRAMUSCULAR | Status: DC | PRN
  Administered 2021-07-23: 4 mg via INTRAVENOUS

## 2021-07-23 MED ORDER — HYDROMORPHONE HCL 1 MG/ML IJ SOLN: 0.2500 mg | INTRAMUSCULAR | Status: DC | PRN

## 2021-07-23 MED ORDER — SODIUM CHLORIDE 0.9 % IR SOLN
Status: DC | PRN
  Administered 2021-07-23 (×2): 3000 mL

## 2021-07-23 MED ORDER — EPHEDRINE SULFATE (PRESSORS) 50 MG/ML IJ SOLN
INTRAMUSCULAR | Status: DC | PRN
  Administered 2021-07-23 (×2): 5 mg via INTRAVENOUS

## 2021-07-23 MED ORDER — BUPIVACAINE-EPINEPHRINE (PF) 0.5% -1:200000 IJ SOLN
INTRAMUSCULAR | Status: AC
  Filled 2021-07-23: qty 60

## 2021-07-23 MED ORDER — EPINEPHRINE PF 1 MG/ML IJ SOLN
INTRAMUSCULAR | Status: AC
  Filled 2021-07-23: qty 3

## 2021-07-23 MED ORDER — FENTANYL CITRATE (PF) 100 MCG/2ML IJ SOLN
INTRAMUSCULAR | Status: AC
  Filled 2021-07-23: qty 2

## 2021-07-23 MED ORDER — FENTANYL CITRATE (PF) 100 MCG/2ML IJ SOLN
INTRAMUSCULAR | Status: DC | PRN
Start: 2021-07-23 — End: 2021-07-23
  Administered 2021-07-23 (×2): 50 ug via INTRAVENOUS

## 2021-07-23 MED ORDER — BUPIVACAINE-EPINEPHRINE (PF) 0.5% -1:200000 IJ SOLN
INTRAMUSCULAR | Status: DC | PRN
  Administered 2021-07-23: 30 mL via PERINEURAL

## 2021-07-23 MED ORDER — KETOROLAC TROMETHAMINE 30 MG/ML IJ SOLN
INTRAMUSCULAR | Status: DC | PRN
  Administered 2021-07-23: 30 mg via INTRAVENOUS

## 2021-07-23 MED ORDER — MIDAZOLAM HCL 5 MG/5ML IJ SOLN
INTRAMUSCULAR | Status: DC | PRN
Start: 2021-07-23 — End: 2021-07-23
  Administered 2021-07-23: 2 mg via INTRAVENOUS

## 2021-07-23 MED ORDER — EPINEPHRINE PF 1 MG/ML IJ SOLN
INTRAMUSCULAR | Status: AC
  Filled 2021-07-23: qty 1

## 2021-07-23 MED ORDER — PROPOFOL 10 MG/ML IV BOLUS
INTRAVENOUS | Status: AC
Start: 2021-07-23 — End: ?
  Filled 2021-07-23: qty 20

## 2021-07-23 MED ORDER — CEFAZOLIN SODIUM-DEXTROSE 2-4 GM/100ML-% IV SOLN
2.0000 g | INTRAVENOUS | Status: AC
  Administered 2021-07-23: 2 g via INTRAVENOUS
  Filled 2021-07-23: qty 100

## 2021-07-23 MED ORDER — EPHEDRINE 5 MG/ML INJ
INTRAVENOUS | Status: AC
  Filled 2021-07-23: qty 5

## 2021-07-23 MED ORDER — CHLORHEXIDINE GLUCONATE 0.12 % MT SOLN
15.0000 mL | Freq: Once | OROMUCOSAL | Status: AC
  Administered 2021-07-23: 15 mL via OROMUCOSAL
  Filled 2021-07-23: qty 15

## 2021-07-23 MED ORDER — ORAL CARE MOUTH RINSE: 15.0000 mL | Freq: Once | OROMUCOSAL | Status: AC

## 2021-07-23 MED ORDER — MIDAZOLAM HCL 2 MG/2ML IJ SOLN
INTRAMUSCULAR | Status: AC
  Filled 2021-07-23: qty 2

## 2021-07-23 MED ORDER — GLYCOPYRROLATE PF 0.2 MG/ML IJ SOSY
PREFILLED_SYRINGE | INTRAMUSCULAR | Status: AC
  Filled 2021-07-23: qty 1

## 2021-07-23 MED ORDER — HYDROCODONE-ACETAMINOPHEN 5-325 MG PO TABS
1.0000 | ORAL_TABLET | Freq: Once | ORAL | Status: AC
  Administered 2021-07-23: 1 via ORAL
  Filled 2021-07-23: qty 1

## 2021-07-23 MED ORDER — LACTATED RINGERS IV SOLN: INTRAVENOUS | Status: DC

## 2021-07-23 MED ORDER — DEXAMETHASONE SODIUM PHOSPHATE 10 MG/ML IJ SOLN
INTRAMUSCULAR | Status: DC | PRN
Start: 2021-07-23 — End: 2021-07-23
  Administered 2021-07-23: 7 mg via INTRAVENOUS

## 2021-07-23 MED ORDER — ONDANSETRON HCL 4 MG/2ML IJ SOLN
4.0000 mg | Freq: Once | INTRAMUSCULAR | Status: AC
  Administered 2021-07-23: 4 mg via INTRAVENOUS
  Filled 2021-07-23: qty 2

## 2021-07-23 MED ORDER — HYDROCODONE-ACETAMINOPHEN 5-325 MG PO TABS: 1.0000 | ORAL_TABLET | ORAL | 0 refills | Status: DC | PRN

## 2021-07-23 MED ORDER — IBUPROFEN 800 MG PO TABS
800.0000 mg | ORAL_TABLET | Freq: Once | ORAL | Status: AC
  Administered 2021-07-23: 800 mg via ORAL
  Filled 2021-07-23: qty 1

## 2021-07-23 MED ORDER — PROMETHAZINE HCL 12.5 MG PO TABS: 12.5000 mg | ORAL_TABLET | Freq: Four times a day (QID) | ORAL | 0 refills | Status: DC | PRN

## 2021-07-23 MED ORDER — PROPOFOL 10 MG/ML IV BOLUS
INTRAVENOUS | Status: DC | PRN
  Administered 2021-07-23: 150 mg via INTRAVENOUS

## 2021-07-23 MED ORDER — ONDANSETRON HCL 4 MG/2ML IJ SOLN: 4.0000 mg | Freq: Once | INTRAMUSCULAR | Status: DC | PRN

## 2021-07-23 MED ORDER — DEXAMETHASONE SODIUM PHOSPHATE 10 MG/ML IJ SOLN
INTRAMUSCULAR | Status: AC
  Filled 2021-07-23: qty 1

## 2021-07-23 MED ORDER — KETOROLAC TROMETHAMINE 30 MG/ML IJ SOLN
INTRAMUSCULAR | Status: AC
  Filled 2021-07-23: qty 1

## 2021-07-23 MED ORDER — PROPOFOL 10 MG/ML IV BOLUS
INTRAVENOUS | Status: AC
  Filled 2021-07-23: qty 20

## 2021-07-23 MED ORDER — MEPERIDINE HCL 50 MG/ML IJ SOLN: 6.2500 mg | INTRAMUSCULAR | Status: DC | PRN

## 2021-07-23 MED ORDER — LIDOCAINE HCL (PF) 2 % IJ SOLN
INTRAMUSCULAR | Status: AC
  Filled 2021-07-23: qty 5

## 2021-07-23 MED ORDER — LIDOCAINE HCL (CARDIAC) PF 100 MG/5ML IV SOSY
PREFILLED_SYRINGE | INTRAVENOUS | Status: DC | PRN
  Administered 2021-07-23: 50 mg via INTRAVENOUS

## 2021-07-23 MED ORDER — ONDANSETRON HCL 4 MG/2ML IJ SOLN
INTRAMUSCULAR | Status: AC
Start: 2021-07-23 — End: ?
  Filled 2021-07-23: qty 2

## 2021-07-23 SURGICAL SUPPLY — 50 items
ABLATOR ASPIRATE 50D MULTI-PRT (SURGICAL WAND) IMPLANT
APL PRP STRL LF DISP 70% ISPRP (MISCELLANEOUS) ×1
BAG HAMPER (MISCELLANEOUS) ×3 IMPLANT
BLADE SHAVER TORPEDO 4X13 (MISCELLANEOUS) ×1 IMPLANT
BLADE SURG SZ11 CARB STEEL (BLADE) ×3 IMPLANT
BNDG CMPR STD VLCR NS LF 5.8X6 (GAUZE/BANDAGES/DRESSINGS) ×1
BNDG ELASTIC 6X5.8 VLCR NS LF (GAUZE/BANDAGES/DRESSINGS) ×3 IMPLANT
CHLORAPREP W/TINT 26 (MISCELLANEOUS) ×3 IMPLANT
CLOTH BEACON ORANGE TIMEOUT ST (SAFETY) ×3 IMPLANT
COOLER ICEMAN CLASSIC (MISCELLANEOUS) ×3 IMPLANT
DECANTER SPIKE VIAL GLASS SM (MISCELLANEOUS) ×5 IMPLANT
DRAPE HALF SHEET 40X57 (DRAPES) ×3 IMPLANT
GAUZE 4X4 16PLY ~~LOC~~+RFID DBL (SPONGE) ×3 IMPLANT
GAUZE SPONGE 4X4 12PLY STRL (GAUZE/BANDAGES/DRESSINGS) ×3 IMPLANT
GAUZE SPONGE 4X4 12PLY STRL LF (GAUZE/BANDAGES/DRESSINGS) ×1 IMPLANT
GAUZE SPONGE 4X4 16PLY XRAY LF (GAUZE/BANDAGES/DRESSINGS) ×3 IMPLANT
GAUZE XEROFORM 5X9 LF (GAUZE/BANDAGES/DRESSINGS) ×3 IMPLANT
GLOVE BIOGEL PI IND STRL 7.0 (GLOVE) ×4 IMPLANT
GLOVE BIOGEL PI INDICATOR 7.0 (GLOVE) ×2
GLOVE SS N UNI LF 8.5 STRL (GLOVE) ×3 IMPLANT
GLOVE SURG POLYISO LF SZ8 (GLOVE) ×3 IMPLANT
GOWN STRL REUS W/TWL LRG LVL3 (GOWN DISPOSABLE) ×3 IMPLANT
GOWN STRL REUS W/TWL XL LVL3 (GOWN DISPOSABLE) ×3 IMPLANT
IV NS IRRIG 3000ML ARTHROMATIC (IV SOLUTION) ×6 IMPLANT
KIT BLADEGUARD II DBL (SET/KITS/TRAYS/PACK) ×3 IMPLANT
KIT TURNOVER CYSTO (KITS) ×3 IMPLANT
MANIFOLD NEPTUNE II (INSTRUMENTS) ×3 IMPLANT
MARKER SKIN DUAL TIP RULER LAB (MISCELLANEOUS) ×3 IMPLANT
NDL HYPO 18GX1.5 BLUNT FILL (NEEDLE) ×2 IMPLANT
NDL HYPO 21X1.5 SAFETY (NEEDLE) ×2 IMPLANT
NDL SPNL 18GX3.5 QUINCKE PK (NEEDLE) ×2 IMPLANT
NEEDLE HYPO 18GX1.5 BLUNT FILL (NEEDLE) ×2 IMPLANT
NEEDLE HYPO 21X1.5 SAFETY (NEEDLE) ×2 IMPLANT
NEEDLE SPNL 18GX3.5 QUINCKE PK (NEEDLE) ×2 IMPLANT
PACK ARTHRO LIMB DRAPE STRL (MISCELLANEOUS) ×3 IMPLANT
PAD ABD 5X9 TENDERSORB (GAUZE/BANDAGES/DRESSINGS) ×3 IMPLANT
PAD ARMBOARD 7.5X6 YLW CONV (MISCELLANEOUS) ×3 IMPLANT
PAD COLD SHLDR SM WRAP-ON (PAD) ×1 IMPLANT
PAD FOR LEG HOLDER (MISCELLANEOUS) ×3 IMPLANT
PADDING CAST COTTON 6X4 STRL (CAST SUPPLIES) ×3 IMPLANT
PORT APPOLLO RF 90DEGREE MULTI (SURGICAL WAND) ×1 IMPLANT
SET ARTHROSCOPY INST (INSTRUMENTS) ×3 IMPLANT
SET BASIN LINEN APH (SET/KITS/TRAYS/PACK) ×3 IMPLANT
SOL ANTI FOG 6CC (MISCELLANEOUS) IMPLANT
SOLUTION ANTI FOG 6CC (MISCELLANEOUS) ×1
SUT ETHILON 3 0 FSL (SUTURE) ×3 IMPLANT
SYR 10ML LL (SYRINGE) ×3 IMPLANT
SYR 30ML LL (SYRINGE) ×3 IMPLANT
TUBE CONNECTING 12X1/4 (SUCTIONS) ×6 IMPLANT
TUBING IN/OUT FLOW W/MAIN PUMP (TUBING) ×3 IMPLANT

## 2021-07-23 NOTE — Anesthesia Preprocedure Evaluation (Addendum)
Anesthesia Evaluation  ?Patient identified by MRN, date of birth, ID band ?Patient awake ? ? ? ?Reviewed: ?Allergy & Precautions, NPO status , Patient's Chart, lab work & pertinent test results ? ?Airway ?Mallampati: II ? ?TM Distance: >3 FB ?Neck ROM: Full ? ? ? Dental ? ?(+) Dental Advisory Given, Missing ?  ?Pulmonary ?neg pulmonary ROS,  ?  ?Pulmonary exam normal ?breath sounds clear to auscultation ? ? ? ? ? ? Cardiovascular ?negative cardio ROS ?Normal cardiovascular exam ?Rhythm:Regular Rate:Normal ? ? ?  ?Neuro/Psych ? Headaches, negative psych ROS  ? GI/Hepatic ?Neg liver ROS, GERD  Medicated,  ?Endo/Other  ?Hypothyroidism  ? Renal/GU ?negative Renal ROS  ?negative genitourinary ?  ?Musculoskeletal ? ?(+) Arthritis , Osteoarthritis,   ? Abdominal ?  ?Peds ?negative pediatric ROS ?(+)  Hematology ?negative hematology ROS ?(+)   ?Anesthesia Other Findings ? ? Reproductive/Obstetrics ?negative OB ROS ? ?  ? ? ? ? ? ? ? ? ? ? ? ? ? ?  ?  ? ? ? ? ? ? ? ?Anesthesia Physical ?Anesthesia Plan ? ?ASA: 2 ? ?Anesthesia Plan: General  ? ?Post-op Pain Management: Dilaudid IV  ? ?Induction: Intravenous ? ?PONV Risk Score and Plan: 4 or greater and Ondansetron, Dexamethasone and Midazolam ? ?Airway Management Planned: LMA ? ?Additional Equipment:  ? ?Intra-op Plan:  ? ?Post-operative Plan: Extubation in OR ? ?Informed Consent: I have reviewed the patients History and Physical, chart, labs and discussed the procedure including the risks, benefits and alternatives for the proposed anesthesia with the patient or authorized representative who has indicated his/her understanding and acceptance.  ? ? ? ?Dental advisory given ? ?Plan Discussed with: CRNA and Surgeon ? ?Anesthesia Plan Comments:   ? ? ? ? ? ?Anesthesia Quick Evaluation ? ?

## 2021-07-23 NOTE — Brief Op Note (Signed)
07/23/2021 ? ?8:57 AM ? ?PATIENT:  Sheri Rice  66 y.o. female ? ?PRE-OPERATIVE DIAGNOSIS:  Torn medial meniscus left knee and chondromalacia patella ? ?POST-OPERATIVE DIAGNOSIS:  Torn medial meniscus left knee and chondromalacia patella ? ?PROCEDURE:  Procedure(s): ?KNEE ARTHROSCOPY WITH CHONDROPLASTY PATELLA AND MEDIAL MENISCUS SHAVING  ? ?SURGEON:  Surgeon(s) and Role: ?   Carole Civil, MD - Primary ? ?PHYSICIAN ASSISTANT:  ? ?ASSISTANTS: none  ? ?ANESTHESIA:   general ? ?EBL:  5 mL  ? ?BLOOD ADMINISTERED:none ? ?DRAINS: none  ? ?LOCAL MEDICATIONS USED:  MARCAINE    ? ?SPECIMEN:  No Specimen ? ?DISPOSITION OF SPECIMEN:  N/A ? ?COUNTS:  YES ? ?TOURNIQUET:  * No tourniquets in log * ? ?DICTATION: .Dragon Dictation ? ?PLAN OF CARE: Discharge to home after PACU ? ?PATIENT DISPOSITION:  PACU - hemodynamically stable. ?  ?Delay start of Pharmacological VTE agent (>24hrs) due to surgical blood loss or risk of bleeding: not applicable ? ?

## 2021-07-23 NOTE — Interval H&P Note (Signed)
History and Physical Interval Note: ? ?07/23/2021 ?8:02 AM ? ?Sheri Rice  has presented today for surgery, with the diagnosis of Torn medial meniscus left knee.  The various methods of treatment have been discussed with the patient and family. After consideration of risks, benefits and other options for treatment, the patient has consented to  Procedure(s): ?KNEE ARTHROSCOPY WITH MEDIAL MENISECTOMY (Left) as a surgical intervention.  The patient's history has been reviewed, patient examined, no change in status, stable for surgery.  I have reviewed the patient's chart and labs.  Questions were answered to the patient's satisfaction.   ? ? ?Arther Abbott ? ? ?

## 2021-07-23 NOTE — Anesthesia Postprocedure Evaluation (Signed)
Anesthesia Post Note ? ?Patient: Sheri Rice ? ?Procedure(s) Performed: KNEE ARTHROSCOPY WITH MEDIAL MENISECTOMY (Left: Knee) ?CHONDROPLASTY (Left: Knee) ? ?Patient location during evaluation: Phase II ?Anesthesia Type: General ?Level of consciousness: awake and alert and oriented ?Pain management: pain level controlled ?Vital Signs Assessment: post-procedure vital signs reviewed and stable ?Respiratory status: spontaneous breathing, nonlabored ventilation and respiratory function stable ?Cardiovascular status: blood pressure returned to baseline and stable ?Postop Assessment: no apparent nausea or vomiting ?Anesthetic complications: no ? ? ?No notable events documented. ? ? ?Last Vitals:  ?Vitals:  ? 07/23/21 0930 07/23/21 0947  ?BP: 95/67 111/61  ?Pulse: 76 71  ?Resp: 10 15  ?Temp:  36.6 ?C  ?SpO2: 100% 98%  ?  ?Last Pain:  ?Vitals:  ? 07/23/21 0947  ?TempSrc: Oral  ?PainSc: 0-No pain  ? ? ?  ?  ?  ?  ?  ?  ? ?Ave Scharnhorst C Josephene Marrone ? ? ? ? ?

## 2021-07-23 NOTE — Op Note (Signed)
07/23/2021 ? ?9:00 AM ? ?Knee arthroscopy dictation ? ?Preop diagnosis torn medial meniscus left knee ? ?Postop diagnosis same with chondromalacia grade 4 medial facet patella left knee ? ?Procedure arthroscopy left knee, chondroplasty patella with meniscal shaving medial meniscus ? ?Surgeon Aline Brochure ? ?Operative findings ? ?MEDIAL ?- meniscus free edge tearing posterior horn ?-articular surface grade 2 diffuse chondromalacia ? ?LATERAL ?- meniscus normal ?-articular surface normal ? ?PTF  ? -articular surface grade 3 medial facet arthritis chondromalacia with trochlear grade 2 diffuse chondromalacia ? ?[place]  ?-acl normal ?-pcl normal ? ? ? ? ?The patient was identified in the preoperative holding area using 2 approved identification mechanisms. The chart was reviewed and updated. The surgical site was confirmed as left knee and marked with an indelible marker. ? ?The patient was taken to the operating room for anesthesia. After successful general anesthesia, Ancef was used as IV antibiotics. ? ?The patient was placed in the supine position with the (left) the operative extremity in an arthroscopic leg holder and the opposite extremity in a padded leg holder. ? ?The timeout was executed. ? ?A lateral portal was established with an 11 blade and the scope was introduced into the joint. A diagnostic arthroscopy was performed in circumferential manner examining the entire knee joint. A medial portal was established and the diagnostic arthroscopy was repeated using a probe to palpate intra-articular structures as they were encountered.  ? ? ?The medial meniscus was trimmed with a motorized shaver.  The remaining meniscus was confirmed stable with a probe.  Only the free edge tearing was removed less than 5% of the medial meniscus ? ?The shaver was used to perform a chondroplasty of the medial facet of the patella ? ?The arthroscopic pump was placed on the wash mode and any excess debris was removed from the joint using  suction. ? ?60 cc of Marcaine with epinephrine was injected through the arthroscope. ? ?The portals were closed with 3-0 nylon suture. ? ?A sterile bandage, Ace wrap and Cryo/Cuff was placed and the Cryo/Cuff was activated. The patient was taken to the recovery room in stable condition. ? ?PHYSICIAN ASSISTANT: no ? ?ASSISTANTS: none  ? ?ANESTHESIA:   General ? ?EBL:  none  ? ?BLOOD ADMINISTERED:none ? ?DRAINS: none  ? ?LOCAL MEDICATIONS USED:  MARCAINE    ? ?SPECIMEN:  No Specimen ? ?DISPOSITION OF SPECIMEN:  N/A ? ?COUNTS:  YES ? ? ?DICTATION: .Dragon Dictation ? ?PLAN OF CARE: Discharge to home after PACU ? ?PATIENT DISPOSITION:  PACU - hemodynamically stable. ?  ?Delay start of Pharmacological VTE agent (>24hrs) due to surgical blood loss or risk of bleeding: not applicable ? ?POST OP PLAN  ?WB as tolerated ?SUTURES OUT IN A WEEK  ?AROM  ?BRACE none ?

## 2021-07-23 NOTE — Transfer of Care (Signed)
Immediate Anesthesia Transfer of Care Note ? ?Patient: Sheri Rice ? ?Procedure(s) Performed: KNEE ARTHROSCOPY WITH MEDIAL MENISECTOMY (Left: Knee) ?CHONDROPLASTY (Left: Knee) ? ?Patient Location: PACU ? ?Anesthesia Type:General ? ?Level of Consciousness: awake, alert , oriented and patient cooperative ? ?Airway & Oxygen Therapy: Patient Spontanous Breathing and Patient connected to nasal cannula oxygen ? ?Post-op Assessment: Report given to RN, Post -op Vital signs reviewed and stable and Patient moving all extremities X 4 ? ?Post vital signs: Reviewed and stable ? ?Last Vitals:  ?Vitals Value Taken Time  ?BP 93/57 07/23/21 0903  ?Temp    ?Pulse 81 07/23/21 0906  ?Resp 12 07/23/21 0906  ?SpO2 97 % 07/23/21 0906  ?Vitals shown include unvalidated device data. ? ?Last Pain:  ?Vitals:  ? 07/23/21 0738  ?TempSrc: Oral  ?PainSc: 0-No pain  ?   ? ?  ? ?Complications: No notable events documented. ?

## 2021-07-23 NOTE — Anesthesia Procedure Notes (Signed)
Procedure Name: LMA Insertion ?Date/Time: 07/23/2021 8:17 AM ?Performed by: Jonna Munro, CRNA ?Pre-anesthesia Checklist: Patient identified, Emergency Drugs available, Suction available, Patient being monitored and Timeout performed ?Patient Re-evaluated:Patient Re-evaluated prior to induction ?Oxygen Delivery Method: Circle system utilized ?Preoxygenation: Pre-oxygenation with 100% oxygen ?Induction Type: IV induction ?LMA: LMA inserted ?LMA Size: 4.0 ?Placement Confirmation: breath sounds checked- equal and bilateral and positive ETCO2 ?Tube secured with: Tape ?Dental Injury: Teeth and Oropharynx as per pre-operative assessment  ? ? ? ? ?

## 2021-07-24 ENCOUNTER — Encounter (HOSPITAL_COMMUNITY): Payer: Self-pay | Admitting: Orthopedic Surgery

## 2021-07-30 ENCOUNTER — Other Ambulatory Visit: Payer: Self-pay

## 2021-07-30 DIAGNOSIS — E039 Hypothyroidism, unspecified: Secondary | ICD-10-CM

## 2021-07-30 MED ORDER — LEVOTHYROXINE SODIUM 75 MCG PO TABS: 75.0000 ug | ORAL_TABLET | Freq: Every day | ORAL | 2 refills | Status: DC

## 2021-07-31 ENCOUNTER — Encounter: Payer: Self-pay | Admitting: Orthopedic Surgery

## 2021-07-31 ENCOUNTER — Ambulatory Visit (INDEPENDENT_AMBULATORY_CARE_PROVIDER_SITE_OTHER): Payer: Medicare HMO | Admitting: Orthopedic Surgery

## 2021-07-31 DIAGNOSIS — M2242 Chondromalacia patellae, left knee: Secondary | ICD-10-CM

## 2021-07-31 DIAGNOSIS — Z9889 Other specified postprocedural states: Secondary | ICD-10-CM

## 2021-07-31 DIAGNOSIS — Z09 Encounter for follow-up examination after completed treatment for conditions other than malignant neoplasm: Secondary | ICD-10-CM

## 2021-07-31 NOTE — Progress Notes (Signed)
FOLLOW UP  ? ?Encounter Diagnoses  ?Name Primary?  ? S/P arthroscopy of left knee Yes  ? Postop check   ? Chondromalacia patellae, left knee   ? ? ? ?Chief Complaint  ?Patient presents with  ? Routine Post Op  ?  DOS 07/23/2021 ?S/p LT knee scope ? ?  ? ? ? ?Postop visit #1 status post arthroscopy left knee primary findings were chondromalacia of the patella primary procedure was chondroplasty there was free edge tearing medial meniscus which was trimmed ? ?Patient complains of feeling crepitance ? ?Recommend home exercise program.  The family member who is the patient's husband and the patient and interpreter assured me that the patient can get someone to read the instructions for the home exercise program ? ?Follow-up in 3 weeks ? ?Sutures were extracted today.  Mild swelling in the knee but the knee is bending well. ?

## 2021-07-31 NOTE — Patient Instructions (Signed)
Follow up in 3 wks s/p LT knee scope ?

## 2021-08-21 ENCOUNTER — Ambulatory Visit (INDEPENDENT_AMBULATORY_CARE_PROVIDER_SITE_OTHER): Payer: Medicare HMO | Admitting: Orthopedic Surgery

## 2021-08-21 ENCOUNTER — Encounter: Payer: Self-pay | Admitting: Orthopedic Surgery

## 2021-08-21 DIAGNOSIS — Z9889 Other specified postprocedural states: Secondary | ICD-10-CM

## 2021-08-21 NOTE — Progress Notes (Signed)
FOLLOW UP   Encounter Diagnosis  Name Primary?   S/P arthroscopy of left knee 07/23/21 Yes     Chief Complaint  Patient presents with   Post-op Follow-up    Left knee scope 07/23/21 front of knee has some burning    Follow-up after knee arthroscopy.  The patient had some irritation of the medial portal  She has full range of motion of the knee  Recommend ice for any swelling Tylenol or ibuprofen for any pain  She can follow-up as needed for the right knee if necessary and as needed for the left knee

## 2021-08-23 ENCOUNTER — Encounter: Payer: Self-pay | Admitting: Orthopedic Surgery

## 2021-09-18 ENCOUNTER — Other Ambulatory Visit: Payer: Self-pay | Admitting: Nurse Practitioner

## 2021-09-18 DIAGNOSIS — J029 Acute pharyngitis, unspecified: Secondary | ICD-10-CM

## 2021-09-18 NOTE — Progress Notes (Unsigned)
Amb

## 2021-09-19 ENCOUNTER — Encounter: Payer: Self-pay | Admitting: Internal Medicine

## 2021-10-06 ENCOUNTER — Other Ambulatory Visit: Payer: Self-pay | Admitting: Nurse Practitioner

## 2021-10-06 DIAGNOSIS — E039 Hypothyroidism, unspecified: Secondary | ICD-10-CM

## 2021-10-14 ENCOUNTER — Ambulatory Visit: Payer: Medicare HMO | Admitting: Physician Assistant

## 2021-10-14 NOTE — Progress Notes (Deleted)
Referring Provider:[name], Dewaine Conger, FNP Primary Care Physician:  Renee Rival, FNP Primary Gastroenterologist:  Dr. Abbey Chatters  No chief complaint on file.   HPI:   Sheri Rice is a 66 y.o. female presenting today at the request of [name], [name], FNP for sore throat and dysphagia.   EGD/TCS in April 2019 for dysphagia, reflux, and colon cancer screening: EGD: normal exam. TCS: Small internal hemorrhoids, otherwise normal exam. Repeat in 10 years.   Today:    Past Medical History:  Diagnosis Date   GERD (gastroesophageal reflux disease)    Hyperlipidemia     Past Surgical History:  Procedure Laterality Date   CHONDROPLASTY Left 07/23/2021   Procedure: CHONDROPLASTY;  Surgeon: Carole Civil, MD;  Location: [facility];  Service: Orthopedics;  Laterality: Left;   DILATION AND CURETTAGE OF UTERUS     KNEE ARTHROSCOPY WITH MEDIAL MENISECTOMY Left 07/23/2021   Procedure: KNEE ARTHROSCOPY WITH MEDIAL MENISECTOMY;  Surgeon: Carole Civil, MD;  Location: [facility];  Service: Orthopedics;  Laterality: Left;    Current Outpatient Medications  Medication Sig Dispense Refill   acetaminophen (TYLENOL) 500 MG tablet Take 2 tablets (1,000 mg total) by mouth every 8 (eight) hours as needed.     Calcium Carb-Cholecalciferol (CALTRATE 600+D3) 600-20 MG-MCG TABS Take 600 mg by mouth 2 (two) times daily. 60 tablet 6   levothyroxine (SYNTHROID) 75 MCG tablet TAKE 1 TABLET EVERY DAY 90 tablet 1   MAGNESIUM GLUCONATE PO Take 400 mg by mouth daily.     omeprazole (PRILOSEC) 20 MG capsule Take 1 capsule (20 mg total) by mouth daily. 90 capsule 3   Potassium 99 MG TABS Take 99 mg by mouth daily.     promethazine (PHENERGAN) 12.5 MG tablet Take 1 tablet (12.5 mg total) by mouth every 6 (six) hours as needed for nausea or vomiting. 30 tablet 0   vitamin E 180 MG (400 UNITS) capsule Take 400 Units by mouth daily. Once a day     Zinc 50 MG CAPS Take 50 mg by mouth daily. Once a day      Current Facility-Administered Medications  Medication Dose Route Frequency Provider Last Rate Last Admin   0.9 %  sodium chloride infusion  500 mL Intravenous Once Irene Shipper, MD        Allergies as of 10/16/2021   (No Known Allergies)    Family History  Problem Relation Age of Onset   Diabetes Mother    Depression Mother    Diabetes type II Sister    Hypertension Sister    Stroke Maternal Grandmother    Colon cancer Neg Hx    Liver cancer Neg Hx    Breast cancer Neg Hx    Cervical cancer Neg Hx     Social History   Socioeconomic History   Marital status: Married    Spouse name: Not on file   Number of children: 3   Years of education: 11   Highest education level: Not on file  Occupational History   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   Tobacco Use   Smoking status: Never   Smokeless tobacco: Never  Vaping Use   Vaping Use: Never used  Substance and Sexual Activity   Alcohol use: No    Alcohol/week: 0.0 standard drinks of alcohol   Drug use: Never   Sexual activity: Yes    Birth control/protection: Post-menopausal  Other Topics Concern   Not on file  Social History Narrative   Pt is  retired, Married , lives with her husband    Social Determinants of Radio broadcast assistant Strain: Not on [employer] Insecurity: Not on file  Transportation Needs: Not on file  Physical Activity: Not on file  Stress: Not on file  Social Connections: Not on file  Intimate Partner Violence: Not on file    Review of Systems: Gen: Denies any fever, chills, cold or flu like symptoms, pre-syncope, or syncope.  CV: Denies chest pain, heart palpitations. Resp: Denies shortness of breath, cough.  GI: See HPI GU : Denies urinary burning, urinary frequency, urinary hesitancy MS: Denies joint pain. Derm: Denies rash. Psych: Denies depression, anxiety. Heme: See HPI  Physical Exam: There were no vitals taken for this visit. General:   Alert and oriented. Pleasant and  cooperative. Well-nourished and well-developed.  Head:  Normocephalic and atraumatic. Eyes:  Without icterus, sclera clear and conjunctiva pink.  Ears:  Normal auditory acuity. Lungs:  Clear to auscultation bilaterally. No wheezes, rales, or rhonchi. No distress.  Heart:  S1, S2 present without murmurs appreciated.  Abdomen:  +BS, soft, non-tender and non-distended. No HSM noted. No guarding or rebound. No masses appreciated.  Rectal:  Deferred  Msk:  Symmetrical without gross deformities. Normal posture. Extremities:  Without edema. Neurologic:  Alert and  oriented x4;  grossly normal neurologically. Skin:  Intact without significant lesions or rashes. Psych: Normal mood and affect.    Assessment:     Plan:  ***   Aliene Altes, PA-C [facility] 10/16/2021

## 2021-10-16 ENCOUNTER — Ambulatory Visit: Payer: Medicare HMO | Admitting: Gastroenterology

## 2021-10-16 ENCOUNTER — Encounter: Payer: Self-pay | Admitting: Internal Medicine

## 2021-11-18 NOTE — Progress Notes (Unsigned)
Referring Provider:  Renee Rival, FNP Primary Care Physician:  Renee Rival, FNP Primary Gastroenterologist:  Dr. Abbey Chatters  No chief complaint on file.   HPI:   Sheri Rice is a 66 y.o. female presenting today at the request of [name], [name], FNP for sore throat and dysphagia.    EGD/TCS in April 2019 by Dr. Scarlette Shorts for dysphagia, reflux, and colon cancer screening: EGD: normal exam. TCS: Small internal hemorrhoids, otherwise normal exam. Repeat in 10 years.    Today:    Past Medical History:  Diagnosis Date   GERD (gastroesophageal reflux disease)    Hyperlipidemia     Past Surgical History:  Procedure Laterality Date   CHONDROPLASTY Left 07/23/2021   Procedure: CHONDROPLASTY;  Surgeon: Carole Civil, MD;  Location: [facility];  Service: Orthopedics;  Laterality: Left;   DILATION AND CURETTAGE OF UTERUS     KNEE ARTHROSCOPY WITH MEDIAL MENISECTOMY Left 07/23/2021   Procedure: KNEE ARTHROSCOPY WITH MEDIAL MENISECTOMY;  Surgeon: Carole Civil, MD;  Location: [facility];  Service: Orthopedics;  Laterality: Left;    Current Outpatient Medications  Medication Sig Dispense Refill   acetaminophen (TYLENOL) 500 MG tablet Take 2 tablets (1,000 mg total) by mouth every 8 (eight) hours as needed.     Calcium Carb-Cholecalciferol (CALTRATE 600+D3) 600-20 MG-MCG TABS Take 600 mg by mouth 2 (two) times daily. 60 tablet 6   levothyroxine (SYNTHROID) 75 MCG tablet TAKE 1 TABLET EVERY DAY 90 tablet 1   MAGNESIUM GLUCONATE PO Take 400 mg by mouth daily.     omeprazole (PRILOSEC) 20 MG capsule Take 1 capsule (20 mg total) by mouth daily. 90 capsule 3   Potassium 99 MG TABS Take 99 mg by mouth daily.     promethazine (PHENERGAN) 12.5 MG tablet Take 1 tablet (12.5 mg total) by mouth every 6 (six) hours as needed for nausea or vomiting. 30 tablet 0   vitamin E 180 MG (400 UNITS) capsule Take 400 Units by mouth daily. Once a day     Zinc 50 MG CAPS Take 50 mg by  mouth daily. Once a day     Current Facility-Administered Medications  Medication Dose Route Frequency Provider Last Rate Last Admin   0.9 %  sodium chloride infusion  500 mL Intravenous Once Irene Shipper, MD        Allergies as of 11/20/2021   (No Known Allergies)    Family History  Problem Relation Age of Onset   Diabetes Mother    Depression Mother    Diabetes type II Sister    Hypertension Sister    Stroke Maternal Grandmother    Colon cancer Neg Hx    Liver cancer Neg Hx    Breast cancer Neg Hx    Cervical cancer Neg Hx     Social History   Socioeconomic History   Marital status: Married    Spouse name: Not on file   Number of children: 3   Years of education: 11   Highest education level: Not on file  Occupational History   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   Tobacco Use   Smoking status: Never   Smokeless tobacco: Never  Vaping Use   Vaping Use: Never used  Substance and Sexual Activity   Alcohol use: No    Alcohol/week: 0.0 standard drinks of alcohol   Drug use: Never   Sexual activity: Yes    Birth control/protection: Post-menopausal  Other Topics Concern   Not on file  Social History Narrative   Pt is retired, Married , lives with her husband    Social Determinants of Radio broadcast assistant Strain: Not on file  Food Insecurity: Not on file  Transportation Needs: Not on file  Physical Activity: Not on file  Stress: Not on file  Social Connections: Not on file  Intimate Partner Violence: Not on file    Review of Systems: Gen: Denies any fever, chills, cold or flu like symptoms, pre-syncope, or syncope.  CV: Denies chest pain, heart palpitations. Resp: Denies shortness of breath, cough.   GI: See HPI GU : Denies urinary burning, urinary frequency, urinary hesitancy MS: Denies joint pain. Derm: Denies rash. Psych: Denies depression, anxiety. Heme: See HPI  Physical Exam: There were no vitals taken for this visit. General:   Alert and  oriented. Pleasant and cooperative. Well-nourished and well-developed.  Head:  Normocephalic and atraumatic. Eyes:  Without icterus, sclera clear and conjunctiva pink.  Ears:  Normal auditory acuity. Lungs:  Clear to auscultation bilaterally. No wheezes, rales, or rhonchi. No distress.  Heart:  S1, S2 present without murmurs appreciated.  Abdomen:  +BS, soft, non-tender and non-distended. No HSM noted. No guarding or rebound. No masses appreciated.  Rectal:  Deferred  Msk:  Symmetrical without gross deformities. Normal posture. Extremities:  Without edema. Neurologic:  Alert and  oriented x4;  grossly normal neurologically. Skin:  Intact without significant lesions or rashes. Psych: Normal mood and affect.    Assessment:     Plan:  ***   Aliene Altes, PA-C [facility] 11/20/2021

## 2021-11-18 NOTE — H&P (View-Only) (Signed)
Referring Provider:  Renee Rival, FNP Primary Care Physician:  Renee Rival, FNP Primary Gastroenterologist:  Dr. Abbey Chatters  Chief Complaint  Patient presents with   Dysphagia    She has a choking feeling. Food and drinks are not going down.     HPI:   Sheri Rice is a 66 y.o. female presenting today at the request of [name], [name], FNP for sore throat and dysphagia.    EGD/TCS in April 2019 by Dr. Scarlette Shorts for dysphagia, reflux, and colon cancer screening: EGD: normal exam. TCS: Small internal hemorrhoids, otherwise normal exam. Repeat in 10 years.    Today:  Dysphagia twice daily. Started about 3 years ago. Can be foods or liquids. O trouble with pills. Feels items get hung at sternal notch.  No regurgitation. History of heartburn that is well controlled on omeprazole 20 mg daily. If she stops it, symptoms return.   No abdominal pain, N/V, brbpr, or melena.   No change in bowel habits. No unintentional weight loss.   NSAIDs: None.   Past Medical History:  Diagnosis Date   GERD (gastroesophageal reflux disease)    Hyperlipidemia    Hypothyroidism     Past Surgical History:  Procedure Laterality Date   CHONDROPLASTY Left 07/23/2021   Procedure: CHONDROPLASTY;  Surgeon: Carole Civil, MD;  Location: [facility];  Service: Orthopedics;  Laterality: Left;   COLONOSCOPY  06/2017   Dr. Scarlette Shorts;  Small internal hemorrhoids, otherwise normal exam. Repeat in 10 years.   DILATION AND CURETTAGE OF UTERUS     ESOPHAGOGASTRODUODENOSCOPY  06/2017   Dr. Scarlette Shorts; Normal exam   KNEE ARTHROSCOPY WITH MEDIAL MENISECTOMY Left 07/23/2021   Procedure: KNEE ARTHROSCOPY WITH MEDIAL MENISECTOMY;  Surgeon: Carole Civil, MD;  Location: [facility];  Service: Orthopedics;  Laterality: Left;    Current Outpatient Medications  Medication Sig Dispense Refill   acetaminophen (TYLENOL) 500 MG tablet Take 2 tablets (1,000 mg total) by mouth every 8 (eight) hours as  needed.     Calcium Carb-Cholecalciferol (CALTRATE 600+D3) 600-20 MG-MCG TABS Take 600 mg by mouth 2 (two) times daily. 60 tablet 6   levothyroxine (SYNTHROID) 75 MCG tablet TAKE 1 TABLET EVERY DAY 90 tablet 1   MAGNESIUM GLUCONATE PO Take 400 mg by mouth daily.     moxifloxacin (VIGAMOX) 0.5 % ophthalmic solution Apply to eye.     omeprazole (PRILOSEC) 20 MG capsule Take 1 capsule (20 mg total) by mouth daily. 90 capsule 3   Potassium 99 MG TABS Take 99 mg by mouth daily.     prednisoLONE acetate (PRED FORTE) 1 % ophthalmic suspension SMARTSIG:In Eye(s)     vitamin E 180 MG (400 UNITS) capsule Take 400 Units by mouth daily. Once a day     Zinc 50 MG CAPS Take 50 mg by mouth daily. Once a day     Current Facility-Administered Medications  Medication Dose Route Frequency Provider Last Rate Last Admin   0.9 %  sodium chloride infusion  500 mL Intravenous Once Irene Shipper, MD        Allergies as of 11/20/2021   (No Known Allergies)    Family History  Problem Relation Age of Onset   Diabetes Mother    Depression Mother    Diabetes type II Sister    Hypertension Sister    Stroke Maternal Grandmother    Colon cancer Neg Hx    Liver cancer Neg Hx    Breast cancer  Neg Hx    Cervical cancer Neg Hx    Stomach cancer Neg Hx    Gastric cancer Neg Hx     Social History   Socioeconomic History   Marital status: Married    Spouse name: Not on file   Number of children: 3   Years of education: 11   Highest education level: Not on file  Occupational History   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   Tobacco Use   Smoking status: Never   Smokeless tobacco: Never  Vaping Use   Vaping Use: Never used  Substance and Sexual Activity   Alcohol use: No    Alcohol/week: 0.0 standard drinks of alcohol   Drug use: Never   Sexual activity: Yes    Birth control/protection: Post-menopausal  Other Topics Concern   Not on file  Social History Narrative   Pt is retired, Married , lives with her husband     Social Determinants of Radio broadcast assistant Strain: Not on file  Food Insecurity: Not on file  Transportation Needs: Not on file  Physical Activity: Not on file  Stress: Not on file  Social Connections: Not on file  Intimate Partner Violence: Not on file    Review of Systems: Gen: Denies any fever, chills, cold or flu like symptoms, pre-syncope, or syncope.  CV: Denies chest pain, heart palpitations. Resp: Denies shortness of breath, cough.   GI: See HPI GU : Denies urinary burning, urinary frequency, urinary hesitancy MS: Denies joint pain. Derm: Denies rash. Psych: Denies depression, anxiety. Heme: See HPI  Physical Exam: BP 118/75 (BP Location: Right Arm, Patient Position: Sitting, Cuff Size: Normal)   Pulse (!) 105   Temp 97.6 F (36.4 C) (Temporal)   Ht '5\' 1"'$  (1.549 m)   Wt 166 lb (75.3 kg)   BMI 31.37 kg/m  General:   Alert and oriented. Pleasant and cooperative. Well-nourished and well-developed.  Head:  Normocephalic and atraumatic. Eyes:  Without icterus, sclera clear and conjunctiva pink.  Ears:  Normal auditory acuity. Lungs:  Clear to auscultation bilaterally. No wheezes, rales, or rhonchi. No distress.  Heart:  S1, S2 present without murmurs appreciated.  Abdomen:  +BS, soft, non-tender and non-distended. No HSM noted. No guarding or rebound. No masses appreciated.  Rectal:  Deferred  Msk:  Symmetrical without gross deformities. Normal posture. Extremities:  Without edema. Neurologic:  Alert and  oriented x4;  grossly normal neurologically. Skin:  Intact without significant lesions or rashes. Psych: Normal mood and affect.    Assessment:  66 year old female with history of GERD, HLD, hypothyroidism, presenting today with chief complaint of solid food and liquid dysphagia x3 years.  No associated regurgitation.  Chronic reflux well controlled on omeprazole 20 mg daily.  No NSAIDs.  No unintentional weight loss.  Prior EGD in 2019 with Dr. Henrene Pastor  with normal exam.   Differentials include esophageal web, ring, stricture in the setting of chronic GERD, EOE, motility disorder, or less likely malignancy.  Plan:  Proceed with upper endoscopy +/- dilation with propofol by Dr. Abbey Chatters in near future. The risks, benefits, and alternatives have been discussed with the patient in detail. The patient states understanding and desires to proceed.  ASA 2 Hold vitamin E x 10 days prior to procedure.  Dysphagia precautions:  Eat slowly, take small bites, chew thoroughly, drink plenty of liquids throughout meals.  Avoid trough textures All meats should be chopped finely.  If something gets hung in your esophagus and will not come up  or go down, proceed to the emergency room.   Continue omeprazole 20 mg daily. Follow-up after procedure.    Aliene Altes, PA-C [facility] 11/20/2021

## 2021-11-20 ENCOUNTER — Encounter: Payer: Self-pay | Admitting: Gastroenterology

## 2021-11-20 ENCOUNTER — Telehealth: Payer: Self-pay | Admitting: *Deleted

## 2021-11-20 ENCOUNTER — Encounter: Payer: Self-pay | Admitting: *Deleted

## 2021-11-20 ENCOUNTER — Ambulatory Visit (INDEPENDENT_AMBULATORY_CARE_PROVIDER_SITE_OTHER): Payer: Medicare HMO | Admitting: Gastroenterology

## 2021-11-20 VITALS — BP 118/75 | HR 105 | Temp 97.6°F | Ht 61.0 in | Wt 166.0 lb

## 2021-11-20 DIAGNOSIS — R1310 Dysphagia, unspecified: Secondary | ICD-10-CM

## 2021-11-20 NOTE — Telephone Encounter (Signed)
PA approved via carelon. Authorization #[id number], DOS:  11/26/2021 - 02/24/2022

## 2021-11-20 NOTE — Patient Instructions (Addendum)
Spanish:  Haremos los arreglos para que usted tenga una endoscopia digestiva alta con posible dilatacin de su esfago en un futuro cercano con el Dr. Abbey Chatters. Deber retener la vitamina D durante 10 das antes de su procedimiento.  Precauciones para tragar: Coma despacio, tome bocados pequeos, mastique bien, beba muchos lquidos [employer]. Evite las texturas a travs Todas las carnes deben picarse finamente. Si algo se atasca en su esfago y no sube ni baja, dirjase a la sala de emergencias.  Fue un placer conocerte hoy!  Nos volveremos a ver despus de su procedimiento.  Aliene Altes, PA-C [facility]    English:  We will arrange for you to have an upper endoscopy with possible dilation of your esophagus in the near future with Dr. Abbey Chatters. You will need to hold vitamin D for 10 days prior to your procedure.  Swallowing precautions:  Eat slowly, take small bites, chew thoroughly, drink plenty of liquids throughout meals.  Avoid trough textures All meats should be chopped finely.  If something gets hung in your esophagus and will not come up or go down, proceed to the emergency room.    It was nice meeting you today!   We will see you back after your procedure.  Aliene Altes, PA-C [facility]

## 2021-11-26 ENCOUNTER — Ambulatory Visit (HOSPITAL_COMMUNITY)
Admission: RE | Admit: 2021-11-26 | Discharge: 2021-11-26 | Disposition: A | Discharge status: Home / Self Care | Payer: Medicare HMO | Attending: Internal Medicine | Admitting: Internal Medicine

## 2021-11-26 ENCOUNTER — Ambulatory Visit (HOSPITAL_BASED_OUTPATIENT_CLINIC_OR_DEPARTMENT_OTHER): Payer: Medicare HMO | Admitting: Anesthesiology

## 2021-11-26 ENCOUNTER — Encounter (HOSPITAL_COMMUNITY): Payer: Self-pay

## 2021-11-26 ENCOUNTER — Other Ambulatory Visit: Payer: Self-pay

## 2021-11-26 ENCOUNTER — Encounter (HOSPITAL_COMMUNITY)
Admission: RE | Disposition: A | Discharge status: Home / Self Care | Payer: Self-pay | Source: Home / Self Care | Attending: Internal Medicine

## 2021-11-26 ENCOUNTER — Ambulatory Visit (HOSPITAL_COMMUNITY): Payer: Medicare HMO | Admitting: Anesthesiology

## 2021-11-26 DIAGNOSIS — E039 Hypothyroidism, unspecified: Secondary | ICD-10-CM | POA: Diagnosis not present

## 2021-11-26 DIAGNOSIS — K2970 Gastritis, unspecified, without bleeding: Secondary | ICD-10-CM

## 2021-11-26 DIAGNOSIS — B9681 Helicobacter pylori [H. pylori] as the cause of diseases classified elsewhere: Secondary | ICD-10-CM | POA: Insufficient documentation

## 2021-11-26 DIAGNOSIS — R1310 Dysphagia, unspecified: Secondary | ICD-10-CM | POA: Insufficient documentation

## 2021-11-26 DIAGNOSIS — K219 Gastro-esophageal reflux disease without esophagitis: Secondary | ICD-10-CM | POA: Insufficient documentation

## 2021-11-26 DIAGNOSIS — K2950 Unspecified chronic gastritis without bleeding: Secondary | ICD-10-CM | POA: Insufficient documentation

## 2021-11-26 HISTORY — PX: ESOPHAGOGASTRODUODENOSCOPY (EGD) WITH PROPOFOL: SHX5813

## 2021-11-26 HISTORY — PX: BIOPSY: SHX5522

## 2021-11-26 HISTORY — PX: BALLOON DILATION: SHX5330

## 2021-11-26 SURGERY — ESOPHAGOGASTRODUODENOSCOPY (EGD) WITH PROPOFOL
Anesthesia: General

## 2021-11-26 MED ORDER — LIDOCAINE HCL 1 % IJ SOLN
INTRAMUSCULAR | Status: DC | PRN
  Administered 2021-11-26: 50 mg via INTRADERMAL

## 2021-11-26 MED ORDER — PROPOFOL 10 MG/ML IV BOLUS
INTRAVENOUS | Status: DC | PRN
  Administered 2021-11-26: 40 mg via INTRAVENOUS
  Administered 2021-11-26: 20 mg via INTRAVENOUS
  Administered 2021-11-26: 60 mg via INTRAVENOUS

## 2021-11-26 MED ORDER — LACTATED RINGERS IV SOLN: INTRAVENOUS | Status: DC

## 2021-11-26 NOTE — Transfer of Care (Addendum)
Immediate Anesthesia Transfer of Care Note  Patient: Sheri Rice  Procedure(s) Performed: ESOPHAGOGASTRODUODENOSCOPY (EGD) WITH PROPOFOL BALLOON DILATION BIOPSY  Patient Location: Endoscopy Unit  Anesthesia Type:General  Level of Consciousness: awake  Airway & Oxygen Therapy: Patient Spontanous Breathing  Post-op Assessment: Report given to RN  Post vital signs: Reviewed and stable  Last Vitals:  Vitals Value Taken Time  BP 104/61   Temp 36.4   Pulse 73   Resp 15   SpO2 98     Last Pain:  Vitals:   11/26/21 1000  TempSrc:   PainSc: 0-No pain         Complications: No notable events documented.

## 2021-11-26 NOTE — Op Note (Signed)
[facility] Patient Name: Sheri Rice Procedure Date: 11/26/2021 9:50 AM MRN: [id number] Date of Birth: 1955/12/11 Attending MD: Elon Alas. Abbey Chatters DO CSN: [id number] Age: 65 Admit Type: Outpatient Procedure:                Upper GI endoscopy Indications:              Dysphagia, Heartburn Providers:                Elon Alas. Abbey Chatters, DO, Caprice Kluver, [place]                            Risa Grill, Technician, Bethel Born,                            Merchant navy officer Referring MD:              Medicines:                See the Anesthesia note for documentation of the                            administered medications Complications:            No immediate complications. Estimated Blood Loss:     Estimated blood loss was minimal. Procedure:                Pre-Anesthesia Assessment:                           - The anesthesia plan was to use monitored                            anesthesia care (MAC).                           After obtaining informed consent, the endoscope was                            passed under direct vision. Throughout the                            procedure, the patient's blood pressure, pulse, and                            oxygen saturations were monitored continuously. The                            GIF-H190 ([id number]) scope was introduced through the                            mouth, and advanced to the second part of duodenum.                            The upper GI endoscopy was accomplished without                            difficulty. The patient tolerated the procedure  well. Scope In: 94:85:46 AM Scope Out: 10:11:34 AM Total Procedure Duration: 0 hours 4 minutes 52 seconds  Findings:      The Z-line was regular and was found 36 cm from the incisors.      No endoscopic abnormality was evident in the esophagus to explain the       patient's complaint of dysphagia. Preparations were made for empiric        dilation. A TTS dilator was passed through the scope. Dilation with an       18-19-20 mm balloon dilator was performed to 20 mm. Dilation was       performed with a mild resistance at 20 mm. Estimated blood loss was none.      Patchy mild inflammation characterized by erythema was found in the       gastric body. Biopsies were taken with a cold forceps for Helicobacter       pylori testing.      The duodenal bulb, first portion of the duodenum and second portion of       the duodenum were normal. Impression:               - Z-line regular, 36 cm from the incisors.                           - Gastritis. Biopsied.                           - Normal duodenal bulb, first portion of the                            duodenum and second portion of the duodenum. Moderate Sedation:      Per Anesthesia Care Recommendation:           - Patient has a contact number available for                            emergencies. The signs and symptoms of potential                            delayed complications were discussed with the                            patient. Return to normal activities tomorrow.                            Written discharge instructions were provided to the                            patient.                           - Resume previous diet.                           - Continue present medications.                           - Await pathology results.                           -  Repeat upper endoscopy PRN for retreatment.                           - Return to GI clinic in 4 months.                           - Use Prilosec (omeprazole) 20 mg PO daily. Procedure Code(s):        --- Professional ---                           [id number], Esophagogastroduodenoscopy, flexible,                            transoral; with biopsy, single or multiple Diagnosis Code(s):        --- Professional ---                           K29.70, Gastritis, unspecified, without bleeding                            R13.10, Dysphagia, unspecified                           R12, Heartburn CPT copyright 2019 American Medical Association. All rights reserved. The codes documented in this report are preliminary and upon coder review may  be revised to meet current compliance requirements. Elon Alas. Abbey Chatters, DO [place], DO 11/26/2021 10:21:07 AM This report has been signed electronically. Number of Addenda: 0

## 2021-11-26 NOTE — Anesthesia Preprocedure Evaluation (Signed)
Anesthesia Evaluation  Patient identified by MRN, date of birth, ID band Patient awake    Reviewed: Allergy & Precautions, H&P , NPO status , Patient's Chart, lab work & pertinent test results, reviewed documented beta blocker date and time   Airway Mallampati: II  TM Distance: >3 FB Neck ROM: full    Dental no notable dental hx.    Pulmonary neg pulmonary ROS,    Pulmonary exam normal breath sounds clear to auscultation       Cardiovascular Exercise Tolerance: Good negative cardio ROS   Rhythm:regular Rate:Normal     Neuro/Psych  Headaches, negative psych ROS   GI/Hepatic Neg liver ROS, GERD  Medicated,  Endo/Other  Hypothyroidism   Renal/GU negative Renal ROS  negative genitourinary   Musculoskeletal   Abdominal   Peds  Hematology negative hematology ROS (+)   Anesthesia Other Findings   Reproductive/Obstetrics negative OB ROS                             Anesthesia Physical Anesthesia Plan  ASA: 2  Anesthesia Plan: General   Post-op Pain Management:    Induction:   PONV Risk Score and Plan: Propofol infusion  Airway Management Planned:   Additional Equipment:   Intra-op Plan:   Post-operative Plan:   Informed Consent: I have reviewed the patients History and Physical, chart, labs and discussed the procedure including the risks, benefits and alternatives for the proposed anesthesia with the patient or authorized representative who has indicated his/her understanding and acceptance.     Dental Advisory Given  Plan Discussed with: CRNA  Anesthesia Plan Comments:         Anesthesia Quick Evaluation

## 2021-11-26 NOTE — Interval H&P Note (Signed)
History and Physical Interval Note:  11/26/2021 9:19 AM  Sheri Rice  has presented today for surgery, with the diagnosis of dysphagia.  The various methods of treatment have been discussed with the patient and family. After consideration of risks, benefits and other options for treatment, the patient has consented to  Procedure(s) with comments: ESOPHAGOGASTRODUODENOSCOPY (EGD) WITH PROPOFOL (N/A) - 10:30 am BALLOON DILATION (N/A) as a surgical intervention.  The patient's history has been reviewed, patient examined, no change in status, stable for surgery.  I have reviewed the patient's chart and labs.  Questions were answered to the patient's satisfaction.     Eloise Harman

## 2021-11-26 NOTE — Discharge Instructions (Signed)
EGD Discharge instructions Please read the instructions outlined below and refer to this sheet in the next few weeks. These discharge instructions provide you with general information on caring for yourself after you leave the hospital. Your doctor may also give you specific instructions. While your treatment has been planned according to the most current medical practices available, unavoidable complications occasionally occur. If you have any problems or questions after discharge, please call your doctor. ACTIVITY You may resume your regular activity but move at a slower pace for the next 24 hours.  Take frequent rest periods for the next 24 hours.  Walking will help expel (get rid of) the air and reduce the bloated feeling in your abdomen.  No driving for 24 hours (because of the anesthesia (medicine) used during the test).  You may shower.  Do not sign any important legal documents or operate any machinery for 24 hours (because of the anesthesia used during the test).  NUTRITION Drink plenty of fluids.  You may resume your normal diet.  Begin with a light meal and progress to your normal diet.  Avoid alcoholic beverages for 24 hours or as instructed by your caregiver.  MEDICATIONS You may resume your normal medications unless your caregiver tells you otherwise.  WHAT YOU CAN EXPECT TODAY You may experience abdominal discomfort such as a feeling of fullness or "gas" pains.  FOLLOW-UP Your doctor will discuss the results of your test with you.  SEEK IMMEDIATE MEDICAL ATTENTION IF ANY OF THE FOLLOWING OCCUR: Excessive nausea (feeling sick to your stomach) and/or vomiting.  Severe abdominal pain and distention (swelling).  Trouble swallowing.  Temperature over 101 F (37.8 C).  Rectal bleeding or vomiting of blood.   Your EGD revealed mild amount inflammation in your stomach.  I took biopsies of this to rule out infection with a bacteria called H. pylori.  Await pathology results, my  office will contact you.  You had a slight narrowing of your esophagus so I stretched it today.  Hopefully this helps with your swallowing.  Continue on omeprazole.  Follow-up with GI in 3 to 4 months.    I hope you have a great rest of your week!  Elon Alas. Abbey Chatters, D.O. Gastroenterology and Hepatology [facility]

## 2021-11-28 LAB — SURGICAL PATHOLOGY

## 2021-11-28 NOTE — Anesthesia Postprocedure Evaluation (Signed)
Anesthesia Post Note  Patient: Annisa Mazzarella  Procedure(s) Performed: ESOPHAGOGASTRODUODENOSCOPY (EGD) WITH PROPOFOL BALLOON DILATION BIOPSY  Patient location during evaluation: Phase II Anesthesia Type: General Level of consciousness: awake Pain management: pain level controlled Vital Signs Assessment: post-procedure vital signs reviewed and stable Respiratory status: spontaneous breathing and respiratory function stable Cardiovascular status: blood pressure returned to baseline and stable Postop Assessment: no headache and no apparent nausea or vomiting Anesthetic complications: no Comments: Late entry   No notable events documented.   Last Vitals:  Vitals:   11/26/21 0932 11/26/21 1020  BP: 115/73 104/61  Pulse: 67 73  Resp: 13 15  Temp: 36.8 C 36.4 C  SpO2: 97% 98%    Last Pain:  Vitals:   11/26/21 1020  TempSrc: Oral  PainSc: 0-No pain                 Louann Sjogren

## 2021-11-29 ENCOUNTER — Encounter (HOSPITAL_COMMUNITY): Payer: Self-pay | Admitting: Internal Medicine

## 2021-12-05 ENCOUNTER — Telehealth: Payer: Self-pay | Admitting: Internal Medicine

## 2021-12-05 DIAGNOSIS — A048 Other specified bacterial intestinal infections: Secondary | ICD-10-CM

## 2021-12-05 DIAGNOSIS — K2100 Gastro-esophageal reflux disease with esophagitis, without bleeding: Secondary | ICD-10-CM

## 2021-12-05 MED ORDER — OMEPRAZOLE 40 MG PO CPDR: 40.0000 mg | DELAYED_RELEASE_CAPSULE | Freq: Two times a day (BID) | ORAL | 5 refills | Status: DC

## 2021-12-05 MED ORDER — BISMUTH 262 MG PO CHEW: 2.0000 | CHEWABLE_TABLET | Freq: Four times a day (QID) | ORAL | 0 refills | Status: AC

## 2021-12-05 MED ORDER — TETRACYCLINE HCL 500 MG PO CAPS: 500.0000 mg | ORAL_CAPSULE | Freq: Four times a day (QID) | ORAL | 0 refills | Status: AC

## 2021-12-05 MED ORDER — METRONIDAZOLE 500 MG PO TABS: 500.0000 mg | ORAL_TABLET | Freq: Three times a day (TID) | ORAL | 0 refills | Status: AC

## 2021-12-05 NOTE — Telephone Encounter (Signed)
Patient has H. pylori gastritis.  I will send in 14-day course of tetracycline 500 mg 4 times daily, metronidazole 500 mg 3 times daily.   I am going to increase patient's Omeprazole to 40 mg twice daily.   I will also send in bismuth to take 4 times daily as well.    Follow-up in 6 to 8 weeks to discuss eradication testing.

## 2021-12-06 NOTE — Telephone Encounter (Signed)
Pt was made aware and verbalized understanding. Transferred patient to the front to schedule f/u appt.

## 2021-12-23 ENCOUNTER — Ambulatory Visit: Payer: Medicare HMO

## 2021-12-24 ENCOUNTER — Ambulatory Visit: Payer: Medicare HMO

## 2022-01-06 ENCOUNTER — Ambulatory Visit: Payer: Medicare HMO | Admitting: Internal Medicine

## 2022-01-14 ENCOUNTER — Encounter: Payer: Self-pay | Admitting: Family Medicine

## 2022-01-14 ENCOUNTER — Ambulatory Visit: Payer: Medicare HMO | Admitting: Nurse Practitioner

## 2022-01-14 ENCOUNTER — Ambulatory Visit (INDEPENDENT_AMBULATORY_CARE_PROVIDER_SITE_OTHER): Payer: Medicare HMO | Admitting: Family Medicine

## 2022-01-14 VITALS — HR 67 | Ht 60.0 in | Wt 163.0 lb

## 2022-01-14 DIAGNOSIS — R7301 Impaired fasting glucose: Secondary | ICD-10-CM

## 2022-01-14 DIAGNOSIS — M25512 Pain in left shoulder: Secondary | ICD-10-CM

## 2022-01-14 DIAGNOSIS — E038 Other specified hypothyroidism: Secondary | ICD-10-CM | POA: Diagnosis not present

## 2022-01-14 DIAGNOSIS — E7849 Other hyperlipidemia: Secondary | ICD-10-CM

## 2022-01-14 DIAGNOSIS — G4762 Sleep related leg cramps: Secondary | ICD-10-CM | POA: Insufficient documentation

## 2022-01-14 DIAGNOSIS — R252 Cramp and spasm: Secondary | ICD-10-CM

## 2022-01-14 DIAGNOSIS — M62838 Other muscle spasm: Secondary | ICD-10-CM | POA: Diagnosis not present

## 2022-01-14 DIAGNOSIS — E559 Vitamin D deficiency, unspecified: Secondary | ICD-10-CM

## 2022-01-14 MED ORDER — CYCLOBENZAPRINE HCL 5 MG PO TABS: 5.0000 mg | ORAL_TABLET | Freq: Three times a day (TID) | ORAL | 1 refills | Status: DC | PRN

## 2022-01-14 NOTE — Assessment & Plan Note (Signed)
Will get basic labs today including her thyroid levels  Encouraged to continue taking Synthroid 75 mcg on an empty stomach every morning

## 2022-01-14 NOTE — Patient Instructions (Addendum)
I appreciate the opportunity to provide care to you today!    Follow up:  3 months  Labs: please stop by the lab today to get your blood drawn (CBC, CMP, TSH, Lipid profile, HgA1c, Vit D)  Please pick up your medication at the pharmacy I recommend that she continue taking Tylenol as needed for pain of the shoulder Please start taking Flexeril 5 mg 3 times daily as needed for pain of the shoulder Flexeril can cause drowsiness so please take it at bedtime  Listed are a few supplements that help with leg cramps You can take over-the-counter vitamin D 800 IU before bed time to help with leg cramps Or you can take vitamin B complex 3 times daily to help with leg cramps Drinking tonic water can also help with leg cramps     Please continue to a heart-healthy diet and increase your physical activities. Try to exercise for 52mns at least three times a week.      It was a pleasure to see you and I look forward to continuing to work together on your health and well-being. Please do not hesitate to call the office if you need care or have questions about your care.   Have a wonderful day and week. With Gratitude, GAlvira MondayMSN, FNP-BC

## 2022-01-14 NOTE — Progress Notes (Signed)
Established Patient Office Visit  Subjective:  Patient ID: Sheri Rice, female    DOB: [birth year]  Age: 66 y.o. MRN: [id number]  CC:  Chief Complaint  Patient presents with   Follow-up    Pt following up, reports leg cramping from the knee down, onset of sx began years ago and some days it worsens worse, also reports shoulder pain that radiates down to her back onset of this began on 01/07/22. Would like to discuss b12 if she should be taking this for energy.     HPI Sheri Rice is a 66 y.o. female with past medical history of hypothyroidism, allergic rhinitis, GERD and presents for f/u of  chronic medical conditions.  Leg cramping: Chronic .she complains of leg cramping from the knees to her lower extremities.  Symptom is worse at nighttime.  History of hypothyroidism, for she takes Synthroid 75 mcg daily.  Left shoulder pain: She follows up with Dr. Arther Abbott, orthopedic surgery.  She reports new onset of left shoulder pain, describing the pain as a sensation of muscle pulling.  She reports taking Tylenol for pain relief.  She denies any recent injury or trauma to the affected shoulder.  Range of motion is intact.  Onset of symptoms 01/07/2022.  Hypothyroidism: She takes Synthroid 75 mcg daily.  She reports adherence to treatment regimen.  She denies changes in her weight, cold intolerance, weight gain, and dry skin.  Past Medical History:  Diagnosis Date   GERD (gastroesophageal reflux disease)    Hyperlipidemia    Hypothyroidism     Past Surgical History:  Procedure Laterality Date   BALLOON DILATION N/A 11/26/2021   Procedure: BALLOON DILATION;  Surgeon: Eloise Harman, DO;  Location: [facility];  Service: Endoscopy;  Laterality: N/A;   BIOPSY  11/26/2021   Procedure: BIOPSY;  Surgeon: Eloise Harman, DO;  Location: [facility];  Service: Endoscopy;;   CHONDROPLASTY Left 07/23/2021   Procedure: CHONDROPLASTY;  Surgeon: Carole Civil, MD;   Location: [facility];  Service: Orthopedics;  Laterality: Left;   COLONOSCOPY  06/2017   Dr. Scarlette Shorts;  Small internal hemorrhoids, otherwise normal exam. Repeat in 10 years.   DILATION AND CURETTAGE OF UTERUS     ESOPHAGOGASTRODUODENOSCOPY  06/2017   Dr. Scarlette Shorts; Normal exam   ESOPHAGOGASTRODUODENOSCOPY (EGD) WITH PROPOFOL N/A 11/26/2021   Procedure: ESOPHAGOGASTRODUODENOSCOPY (EGD) WITH PROPOFOL;  Surgeon: Eloise Harman, DO;  Location: [facility];  Service: Endoscopy;  Laterality: N/A;  10:30 am   KNEE ARTHROSCOPY WITH MEDIAL MENISECTOMY Left 07/23/2021   Procedure: KNEE ARTHROSCOPY WITH MEDIAL MENISECTOMY;  Surgeon: Carole Civil, MD;  Location: [facility];  Service: Orthopedics;  Laterality: Left;    Family History  Problem Relation Age of Onset   Diabetes Mother    Depression Mother    Diabetes type II Sister    Hypertension Sister    Stroke Maternal Grandmother    Colon cancer Neg Hx    Liver cancer Neg Hx    Breast cancer Neg Hx    Cervical cancer Neg Hx    Stomach cancer Neg Hx    Gastric cancer Neg Hx     Social History   Socioeconomic History   Marital status: Married    Spouse name: Not on file   Number of children: 3   Years of education: 11   Highest education level: Not on file  Occupational History   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   Tobacco Use   Smoking  status: Never   Smokeless tobacco: Never  Vaping Use   Vaping Use: Never used  Substance and Sexual Activity   Alcohol use: No    Alcohol/week: 0.0 standard drinks of alcohol   Drug use: Never   Sexual activity: Yes    Birth control/protection: Post-menopausal  Other Topics Concern   Not on file  Social History Narrative   Pt is retired, Married , lives with her husband    Social Determinants of Radio broadcast assistant Strain: Not on file  Food Insecurity: Not on file  Transportation Needs: Not on file  Physical Activity: Not on file  Stress: Not on file  Social Connections: Not on file   Intimate Partner Violence: Not on file    Outpatient Medications Prior to Visit  Medication Sig Dispense Refill   acetaminophen (TYLENOL) 500 MG tablet Take 2 tablets (1,000 mg total) by mouth every 8 (eight) hours as needed.     Calcium Carb-Cholecalciferol (CALTRATE 600+D3) 600-20 MG-MCG TABS Take 600 mg by mouth 2 (two) times daily. 60 tablet 6   levothyroxine (SYNTHROID) 75 MCG tablet TAKE 1 TABLET EVERY DAY 90 tablet 1   MAGNESIUM GLUCONATE PO Take 400 mg by mouth daily.     omeprazole (PRILOSEC) 40 MG capsule Take 1 capsule (40 mg total) by mouth 2 (two) times daily before a meal. 60 capsule 5   Potassium 99 MG TABS Take 99 mg by mouth daily.     prednisoLONE acetate (PRED FORTE) 1 % ophthalmic suspension Place 1 drop into both eyes daily.     Vitamin A 2400 MCG (8000 UT) CAPS Take 800 Units by mouth daily.     vitamin E 180 MG (400 UNITS) capsule Take 400 Units by mouth daily.     Zinc 50 MG CAPS Take 50 mg by mouth daily.     cholecalciferol (VITAMIN D3) 25 MCG (1000 UNIT) tablet Take 1,000 Units by mouth daily. (Patient not taking: Reported on 01/14/2022)     Facility-Administered Medications Prior to Visit  Medication Dose Route Frequency Provider Last Rate Last Admin   0.9 %  sodium chloride infusion  500 mL Intravenous Once Irene Shipper, MD        No Known Allergies  ROS Review of Systems  Constitutional:  Negative for activity change and appetite change.  Eyes:  Negative for visual disturbance.  Respiratory:  Negative for chest tightness and shortness of breath.   Cardiovascular:  Negative for chest pain and palpitations.  Musculoskeletal:        Shoulder pain and cramping of her lower extremities  Neurological:  Negative for dizziness and headaches.      Objective:    Physical Exam HENT:     Head: Normocephalic.  Cardiovascular:     Rate and Rhythm: Normal rate and regular rhythm.     Pulses: Normal pulses.     Heart sounds: Normal heart sounds.   Musculoskeletal:     Left shoulder: Tenderness present. No swelling, deformity or crepitus. Normal range of motion.  Neurological:     Mental Status: She is alert.     Pulse 67   Ht 5' (1.524 m)   Wt 163 lb (73.9 kg)   SpO2 95%   BMI 31.83 kg/m  Wt Readings from Last 3 Encounters:  01/14/22 163 lb (73.9 kg)  11/20/21 166 lb (75.3 kg)  07/18/21 162 lb (73.5 kg)    Lab Results  Component Value Date   TSH 2.270 07/12/2021  Lab Results  Component Value Date   WBC 7.6 03/16/2019   HGB 11.9 (L) 03/16/2019   HCT 35.9 (L) 03/16/2019   MCV 91.4 03/16/2019   PLT 300.0 03/16/2019   Lab Results  Component Value Date   NA 141 05/31/2021   K 3.8 05/31/2021   CO2 21 05/31/2021   GLUCOSE 98 05/31/2021   BUN 12 05/31/2021   CREATININE 0.70 05/31/2021   BILITOT 0.3 05/31/2021   ALKPHOS 109 05/31/2021   AST 17 05/31/2021   ALT 16 05/31/2021   PROT 6.5 05/31/2021   ALBUMIN 4.0 05/31/2021   CALCIUM 9.3 05/31/2021   EGFR 96 05/31/2021   GFR 87.88 12/18/2020   Lab Results  Component Value Date   CHOL 153 05/31/2021   Lab Results  Component Value Date   HDL 48 05/31/2021   Lab Results  Component Value Date   LDLCALC 75 05/31/2021   Lab Results  Component Value Date   TRIG 178 (H) 05/31/2021   Lab Results  Component Value Date   CHOLHDL 3.2 05/31/2021   Lab Results  Component Value Date   HGBA1C 5.4 03/03/2016      Assessment & Plan:   Problem List Items Addressed This Visit       Endocrine   Hypothyroidism    Will get basic labs today including her thyroid levels  Encouraged to continue taking Synthroid 75 mcg on an empty stomach every morning       Relevant Orders   TSH + free T4     Other   Leg cramping    Chronic We will check thyroid levels today as hypothyroidism can cause leg cramps She takes Synthroid 75 mcg daily  Encouraged to take vitamin D8 100 IU before bedtime to help with leg cramps Reports that she can also take vitamin B  complex 3 times daily to help with leg cramps Encourage drinking tonic water to help with leg cramps       Left shoulder pain - Primary    Encouraged to continue taking Tylenol as needed for shoulder pain We will start the patient on Flexeril 5 mg 3 times daily as needed for shoulder pain Informed the patient that Flexeril can cause drowsiness and to take the medication at bedtime Encouraged to follow up with orthopedics if her symptoms worsen or are on relief with conservative management      Other Visit Diagnoses     Muscle spasm       Relevant Medications   cyclobenzaprine (FLEXERIL) 5 MG tablet   Vitamin D deficiency       Relevant Orders   Vitamin D (25 hydroxy)   IFG (impaired fasting glucose)       Relevant Orders   CBC with Differential/Platelet   CMP14+EGFR   Hemoglobin A1C   Other hyperlipidemia       Relevant Orders   Lipid Profile       Meds ordered this encounter  Medications   cyclobenzaprine (FLEXERIL) 5 MG tablet    Sig: Take 1 tablet (5 mg total) by mouth 3 (three) times daily as needed for muscle spasms.    Dispense:  30 tablet    Refill:  1    Follow-up: Return in about 3 months (around 04/16/2022).    Alvira Monday, FNP

## 2022-01-14 NOTE — Assessment & Plan Note (Signed)
Chronic We will check thyroid levels today as hypothyroidism can cause leg cramps She takes Synthroid 75 mcg daily  Encouraged to take vitamin D8 100 IU before bedtime to help with leg cramps Reports that she can also take vitamin B complex 3 times daily to help with leg cramps Encourage drinking tonic water to help with leg cramps

## 2022-01-14 NOTE — Assessment & Plan Note (Signed)
Encouraged to continue taking Tylenol as needed for shoulder pain We will start the patient on Flexeril 5 mg 3 times daily as needed for shoulder pain Informed the patient that Flexeril can cause drowsiness and to take the medication at bedtime Encouraged to follow up with orthopedics if her symptoms worsen or are on relief with conservative management

## 2022-01-15 ENCOUNTER — Other Ambulatory Visit: Payer: Self-pay | Admitting: Family Medicine

## 2022-01-15 DIAGNOSIS — E038 Other specified hypothyroidism: Secondary | ICD-10-CM

## 2022-01-15 DIAGNOSIS — E559 Vitamin D deficiency, unspecified: Secondary | ICD-10-CM

## 2022-01-15 LAB — LIPID PANEL
Chol/HDL Ratio: 4.7 ratio — ABNORMAL HIGH (ref 0.0–4.4)
Cholesterol, Total: 237 mg/dL — ABNORMAL HIGH (ref 100–199)
HDL: 50 mg/dL (ref >39)
LDL Chol Calc (NIH): 133 mg/dL — ABNORMAL HIGH (ref 0–99)
Triglycerides: 303 mg/dL — ABNORMAL HIGH (ref 0–149)
VLDL Cholesterol Cal: 54 mg/dL — ABNORMAL HIGH (ref 5–40)

## 2022-01-15 LAB — VITAMIN D 25 HYDROXY (VIT D DEFICIENCY, FRACTURES): Vit D, 25-Hydroxy: 21.7 ng/mL — ABNORMAL LOW (ref 30.0–100.0)

## 2022-01-15 LAB — CMP14+EGFR
ALT: 21 IU/L (ref 0–32)
AST: 22 IU/L (ref 0–40)
Albumin/Globulin Ratio: 1.6 (ref 1.2–2.2)
Albumin: 4.5 g/dL (ref 3.9–4.9)
Alkaline Phosphatase: 90 IU/L (ref 44–121)
BUN/Creatinine Ratio: 16 (ref 12–28)
BUN: 12 mg/dL (ref 8–27)
Bilirubin Total: 0.3 mg/dL (ref 0.0–1.2)
CO2: 23 mmol/L (ref 20–29)
Calcium: 9.3 mg/dL (ref 8.7–10.3)
Chloride: 101 mmol/L (ref 96–106)
Creatinine, Ser: 0.77 mg/dL (ref 0.57–1.00)
Globulin, Total: 2.8 g/dL (ref 1.5–4.5)
Glucose: 92 mg/dL (ref 70–99)
Potassium: 4.5 mmol/L (ref 3.5–5.2)
Sodium: 140 mmol/L (ref 134–144)
Total Protein: 7.3 g/dL (ref 6.0–8.5)
eGFR: 85 mL/min/{1.73_m2} (ref >59)

## 2022-01-15 LAB — CBC WITH DIFFERENTIAL/PLATELET
Basophils Absolute: 0 10*3/uL (ref 0.0–0.2)
Basos: 1 %
EOS (ABSOLUTE): 0.1 10*3/uL (ref 0.0–0.4)
Eos: 2 %
Hematocrit: 39.7 % (ref 34.0–46.6)
Hemoglobin: 13.6 g/dL (ref 11.1–15.9)
Immature Grans (Abs): 0 10*3/uL (ref 0.0–0.1)
Immature Granulocytes: 0 %
Lymphocytes Absolute: 2.1 10*3/uL (ref 0.7–3.1)
Lymphs: 36 %
MCH: 30.8 pg (ref 26.6–33.0)
MCHC: 34.3 g/dL (ref 31.5–35.7)
MCV: 90 fL (ref 79–97)
Monocytes Absolute: 0.5 10*3/uL (ref 0.1–0.9)
Monocytes: 8 %
Neutrophils Absolute: 3.2 10*3/uL (ref 1.4–7.0)
Neutrophils: 53 %
Platelets: 291 10*3/uL (ref 150–450)
RBC: 4.42 x10E6/uL (ref 3.77–5.28)
RDW: 12.8 % (ref 11.7–15.4)
WBC: 5.9 10*3/uL (ref 3.4–10.8)

## 2022-01-15 LAB — HEMOGLOBIN A1C
Est. average glucose Bld gHb Est-mCnc: 114 mg/dL
Hgb A1c MFr Bld: 5.6 % (ref 4.8–5.6)

## 2022-01-15 LAB — TSH+FREE T4
Free T4: 1.81 ng/dL — ABNORMAL HIGH (ref 0.82–1.77)
TSH: 1.17 u[IU]/mL (ref 0.450–4.500)

## 2022-01-15 MED ORDER — VITAMIN D (ERGOCALCIFEROL) 1.25 MG (50000 UNIT) PO CAPS: 50000.0000 [IU] | ORAL_CAPSULE | ORAL | 2 refills | Status: DC

## 2022-01-15 NOTE — Progress Notes (Signed)
Please inform the patient to return for labs around February 26, 2022 to assess her thyroid levels.  Her T4 is slightly elevated.  Please inform the patient that her cholesterol is slightly elevated.  I recommend low carbs, fat, with increased physical activities.  Her vitamin D is slightly low, i've send a prescription for vitamin D weekly supplement to her pharmacy.  I recommend that she starts therapy soon.

## 2022-01-15 NOTE — Progress Notes (Signed)
The 10-year ASCVD risk score (Arnett DK, et al., 2019) is: 4.7%   Values used to calculate the score:     Age: 66 years     Sex: Female     Is Non-Hispanic African American: No     Diabetic: No     Tobacco smoker: No     Systolic Blood Pressure: 530 mmHg     Is BP treated: No     HDL Cholesterol: 50 mg/dL     Total Cholesterol: 237 mg/dL

## 2022-01-22 ENCOUNTER — Other Ambulatory Visit: Payer: Self-pay | Admitting: Nurse Practitioner

## 2022-01-23 ENCOUNTER — Ambulatory Visit: Payer: Medicare HMO | Admitting: Internal Medicine

## 2022-01-23 ENCOUNTER — Encounter: Payer: Self-pay | Admitting: Internal Medicine

## 2022-01-23 VITALS — BP 120/78 | HR 75 | Temp 97.9°F | Ht 61.0 in | Wt 164.6 lb

## 2022-01-23 DIAGNOSIS — K219 Gastro-esophageal reflux disease without esophagitis: Secondary | ICD-10-CM

## 2022-01-23 DIAGNOSIS — R1319 Other dysphagia: Secondary | ICD-10-CM

## 2022-01-23 DIAGNOSIS — A048 Other specified bacterial intestinal infections: Secondary | ICD-10-CM | POA: Diagnosis not present

## 2022-01-23 NOTE — Patient Instructions (Signed)
Voy a ordenar una prueba de aliento para H. pylori en [facility].  Debe suspender el omeprazol durante [address] antes de [place]. Despus de la prueba puede reiniciar inmediatamente su omeprazol.  Durante esos [address] puedes tomar famotidina (Pepcid) de venta libre para cualquier sntoma de reflujo.  Me alegra saber que te sientes mejor.  Le llamaremos con los resultados de la prueba.  Dr. Abbey Chatters

## 2022-01-23 NOTE — Progress Notes (Signed)
Referring Provider: Renee Rival, FNP Primary Care Physician:  Alvira Monday, FNP Primary GI:  Dr. Abbey Chatters  Chief Complaint  Patient presents with   Follow-up    6 week follow up for dysphagia    HPI:   Sheri Rice is a 66 y.o. female who presents to clinic today for procedure follow-up visit.  Underwent EGD for dysphagia, reflux 11/20/2021.  Esophagus appeared normal, status post empiric dilation up to 20 mm.  Gastritis noted, biopsies positive for H. pylori.  Duodenum normal.  Completed 14-day course of tetracycline, metronidazole, bismuth, currently on omeprazole 40 mg twice daily.  Today, she states her dysphagia is much improved.  Reflux much improved.  Past Medical History:  Diagnosis Date   GERD (gastroesophageal reflux disease)    Hyperlipidemia    Hypothyroidism     Past Surgical History:  Procedure Laterality Date   BALLOON DILATION N/A 11/26/2021   Procedure: BALLOON DILATION;  Surgeon: Eloise Harman, DO;  [facility];  Service: Endoscopy;  Laterality: N/A;   BIOPSY  11/26/2021   Procedure: BIOPSY;  Surgeon: Eloise Harman, DO;  [facility];  Service: Endoscopy;;   CHONDROPLASTY Left 07/23/2021   Procedure: CHONDROPLASTY;  Surgeon: Carole Civil, MD;  Location: [facility];  Service: Orthopedics;  Laterality: Left;   COLONOSCOPY  06/2017   Dr. Scarlette Shorts;  Small internal hemorrhoids, otherwise normal exam. Repeat in 10 years.   DILATION AND CURETTAGE OF UTERUS     ESOPHAGOGASTRODUODENOSCOPY  06/2017   Dr. Scarlette Shorts; Normal exam   ESOPHAGOGASTRODUODENOSCOPY (EGD) WITH PROPOFOL N/A 11/26/2021   Procedure: ESOPHAGOGASTRODUODENOSCOPY (EGD) WITH PROPOFOL;  Surgeon: Eloise Harman, DO;  [facility];  Service: Endoscopy;  Laterality: N/A;  10:30 am   KNEE ARTHROSCOPY WITH MEDIAL MENISECTOMY Left 07/23/2021   Procedure: KNEE ARTHROSCOPY WITH MEDIAL MENISECTOMY;  Surgeon: Carole Civil, MD;  Location: [facility];  Service: Orthopedics;  Laterality: Left;    Current Outpatient Medications  Medication Sig Dispense Refill   acetaminophen (TYLENOL) 500 MG tablet Take 2 tablets (1,000 mg total) by mouth every 8 (eight) hours as needed.     Calcium Carb-Cholecalciferol (CALTRATE 600+D3) 600-20 MG-MCG TABS Take 600 mg by mouth 2 (two) times daily. 60 tablet 6   cholecalciferol (VITAMIN D3) 25 MCG (1000 UNIT) tablet Take 1,000 Units by mouth daily.     cyclobenzaprine (FLEXERIL) 5 MG tablet Take 1 tablet (5 mg total) by mouth 3 (three) times daily as needed for muscle spasms. 30 tablet 1   levothyroxine (SYNTHROID) 75 MCG tablet TAKE 1 TABLET EVERY DAY 90 tablet 1   MAGNESIUM GLUCONATE PO Take 400 mg by mouth daily.     omeprazole (PRILOSEC) 40 MG capsule Take 1 capsule (40 mg total) by mouth 2 (two) times daily before a meal. 60 capsule 5   Potassium 99 MG TABS Take 99 mg by mouth daily.     prednisoLONE acetate (PRED FORTE) 1 % ophthalmic suspension Place 1 drop into both eyes daily.     Vitamin A 2400 MCG (8000 UT) CAPS Take 800 Units by mouth daily.     Vitamin D, Ergocalciferol, (DRISDOL) 1.25 MG (50000 UNIT) CAPS capsule Take 1 capsule (50,000 Units total) by mouth every 7 (seven) days. 5 capsule 2   vitamin E 180 MG (400 UNITS) capsule Take 400 Units by mouth daily.     Zinc 50 MG CAPS Take 50 mg by mouth daily.  omeprazole (PRILOSEC) 20 MG capsule TAKE 1 CAPSULE EVERY DAY (Patient not taking: Reported on 01/23/2022) 90 capsule 10   Current Facility-Administered Medications  Medication Dose Route Frequency Provider Last Rate Last Admin   0.9 %  sodium chloride infusion  500 mL Intravenous Once Irene Shipper, MD        Allergies as of 01/23/2022   (No Known Allergies)    Family History  Problem Relation Age of Onset   Diabetes Mother    Depression Mother    Diabetes type II Sister    Hypertension Sister    Stroke Maternal Grandmother    Colon cancer Neg Hx    Liver cancer Neg Hx     Breast cancer Neg Hx    Cervical cancer Neg Hx    Stomach cancer Neg Hx    Gastric cancer Neg Hx     Social History   Socioeconomic History   Marital status: Married    Spouse name: Not on file   Number of children: 3   Years of education: 11   Highest education level: Not on file  Occupational History   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   Tobacco Use   Smoking status: Never   Smokeless tobacco: Never  Vaping Use   Vaping Use: Never used  Substance and Sexual Activity   Alcohol use: No    Alcohol/week: 0.0 standard drinks of alcohol   Drug use: Never   Sexual activity: Yes    Birth control/protection: Post-menopausal  Other Topics Concern   Not on file  Social History Narrative   Pt is retired, Married , lives with her husband    Social Determinants of Radio broadcast assistant Strain: Not on file  Food Insecurity: Not on file  Transportation Needs: Not on file  Physical Activity: Not on file  Stress: Not on file  Social Connections: Not on file    Subjective: Review of Systems  Constitutional:  Negative for chills and fever.  HENT:  Negative for congestion and hearing loss.   Eyes:  Negative for blurred vision and double vision.  Respiratory:  Negative for cough and shortness of breath.   Cardiovascular:  Negative for chest pain and palpitations.  Gastrointestinal:  Negative for abdominal pain, blood in stool, constipation, diarrhea, heartburn, melena and vomiting.  Genitourinary:  Negative for dysuria and urgency.  Musculoskeletal:  Negative for joint pain and myalgias.  Skin:  Negative for itching and rash.  Neurological:  Negative for dizziness and headaches.  Psychiatric/Behavioral:  Negative for depression. The patient is not nervous/anxious.      Objective: BP 120/78   Pulse 75   Temp 97.9 F (36.6 C)   Ht '5\' 1"'$  (1.549 m)   Wt 164 lb 9.6 oz (74.7 kg)   BMI 31.10 kg/m  Physical Exam Constitutional:      Appearance: Normal appearance.  HENT:      Head: Normocephalic and atraumatic.  Eyes:     Extraocular Movements: Extraocular movements intact.     Conjunctiva/sclera: Conjunctivae normal.  Cardiovascular:     Rate and Rhythm: Normal rate and regular rhythm.  Pulmonary:     Effort: Pulmonary effort is normal.     Breath sounds: Normal breath sounds.  Abdominal:     General: Bowel sounds are normal.     Palpations: Abdomen is soft.  Musculoskeletal:        General: No swelling. Normal range of motion.     Cervical back: Normal range of motion and  neck supple.  Skin:    General: Skin is warm and dry.     Coloration: Skin is not jaundiced.  Neurological:     General: No focal deficit present.     Mental Status: She is alert and oriented to person, place, and time.  Psychiatric:        Mood and Affect: Mood normal.        Behavior: Behavior normal.      Assessment: *H. pylori gastritis *Chronic GERD *Dysphagia  Plan: Patient feeling much better after her recent EGD, status post empiric dilation and treatment 14-day course with quadruple therapy for H. pylori gastritis.  Discussed H. pylori in depth with patient today.  We will order H. pylori breath testing at [facility].  Patient counseled that she will need to hold her omeprazole x14 days prior to test.  No Pepto-Bismol during this time either.  We will call her with results.  01/23/2022 9:59 AM   Disclaimer: This note was dictated with voice recognition software. Similar sounding words can inadvertently be transcribed and may not be corrected upon review.

## 2022-01-24 ENCOUNTER — Other Ambulatory Visit: Payer: Self-pay | Admitting: Nurse Practitioner

## 2022-03-07 ENCOUNTER — Telehealth: Payer: Self-pay | Admitting: Family Medicine

## 2022-04-22 ENCOUNTER — Encounter: Payer: Self-pay | Admitting: Family Medicine

## 2022-04-22 ENCOUNTER — Ambulatory Visit (INDEPENDENT_AMBULATORY_CARE_PROVIDER_SITE_OTHER): Payer: Medicare HMO | Admitting: Family Medicine

## 2022-04-22 VITALS — BP 132/80 | HR 86 | Ht 61.5 in | Wt 161.0 lb

## 2022-04-22 DIAGNOSIS — E559 Vitamin D deficiency, unspecified: Secondary | ICD-10-CM

## 2022-04-22 DIAGNOSIS — B349 Viral infection, unspecified: Secondary | ICD-10-CM

## 2022-04-22 DIAGNOSIS — E782 Mixed hyperlipidemia: Secondary | ICD-10-CM

## 2022-04-22 DIAGNOSIS — R1310 Dysphagia, unspecified: Secondary | ICD-10-CM | POA: Diagnosis not present

## 2022-04-22 DIAGNOSIS — R7301 Impaired fasting glucose: Secondary | ICD-10-CM

## 2022-04-22 DIAGNOSIS — K219 Gastro-esophageal reflux disease without esophagitis: Secondary | ICD-10-CM | POA: Diagnosis not present

## 2022-04-22 DIAGNOSIS — M25561 Pain in right knee: Secondary | ICD-10-CM

## 2022-04-22 DIAGNOSIS — M25562 Pain in left knee: Secondary | ICD-10-CM | POA: Diagnosis not present

## 2022-04-22 DIAGNOSIS — E038 Other specified hypothyroidism: Secondary | ICD-10-CM | POA: Diagnosis not present

## 2022-04-22 MED ORDER — VITAMIN D (ERGOCALCIFEROL) 1.25 MG (50000 UNIT) PO CAPS: 50000.0000 [IU] | ORAL_CAPSULE | ORAL | 1 refills | Status: DC

## 2022-04-22 MED ORDER — PROMETHAZINE-DM 6.25-15 MG/5ML PO SYRP: 5.0000 mL | ORAL_SOLUTION | Freq: Four times a day (QID) | ORAL | 0 refills | Status: DC | PRN

## 2022-04-22 NOTE — Assessment & Plan Note (Signed)
Stable on omeprazole 40 mg daily

## 2022-04-22 NOTE — Progress Notes (Signed)
Established Patient Office Visit  Subjective:  Patient ID: Sheri Rice, female    DOB: [birth year]  Age: 67 y.o. MRN: [id number]  CC:  Chief Complaint  Patient presents with   Follow-up    3 month f/u. Pt reports throat choking or mild cough and phlegm since 03/22/2022, did have an endoscopy in 12/2021. Pt reports left knee pain and right knee pain states right knee locks on her since 04/01/2022.     HPI Sheri Rice is a 67 y.o. female with past medical history of dysphagia,arthroscopy of left knee, hypertension, hyperlipidemia presents for f/u of  chronic medical conditions. For the details of today's visit, please refer to the assessment and plan.    Past Medical History:  Diagnosis Date   GERD (gastroesophageal reflux disease)    Hyperlipidemia    Hypothyroidism     Past Surgical History:  Procedure Laterality Date   BALLOON DILATION N/A 11/26/2021   Procedure: BALLOON DILATION;  Surgeon: Eloise Harman, DO;  [facility];  Service: Endoscopy;  Laterality: N/A;   BIOPSY  11/26/2021   Procedure: BIOPSY;  Surgeon: Eloise Harman, DO;  [facility];  Service: Endoscopy;;   CHONDROPLASTY Left 07/23/2021   Procedure: CHONDROPLASTY;  Surgeon: Carole Civil, MD;  Location[facility];  Service: Orthopedics;  Laterality: Left;   COLONOSCOPY  06/2017   Dr. Scarlette Shorts;  Small internal hemorrhoids, otherwise normal exam. Repeat in 10 years.   DILATION AND CURETTAGE OF UTERUS     ESOPHAGOGASTRODUODENOSCOPY  06/2017   Dr. Scarlette Shorts; Normal exam   ESOPHAGOGASTRODUODENOSCOPY (EGD) WITH PROPOFOL N/A 11/26/2021   Procedure: ESOPHAGOGASTRODUODENOSCOPY (EGD) WITH PROPOFOL;  Surgeon: Eloise Harman, DO;  [facility];  Service: Endoscopy;  Laterality: N/A;  10:30 am   KNEE ARTHROSCOPY WITH MEDIAL MENISECTOMY Left 07/23/2021   Procedure: KNEE ARTHROSCOPY WITH MEDIAL MENISECTOMY;  Surgeon: Carole Civil, MD;  Location: [facility];  Service:  Orthopedics;  Laterality: Left;    Family History  Problem Relation Age of Onset   Diabetes Mother    Depression Mother    Diabetes type II Sister    Hypertension Sister    Stroke Maternal Grandmother    Colon cancer Neg Hx    Liver cancer Neg Hx    Breast cancer Neg Hx    Cervical cancer Neg Hx    Stomach cancer Neg Hx    Gastric cancer Neg Hx     Social History   Socioeconomic History   Marital status: Married    Spouse name: Not on file   Number of children: 3   Years of education: 11   Highest education level: Not on file  Occupational History   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   Tobacco Use   Smoking status: Never   Smokeless tobacco: Never  Vaping Use   Vaping Use: Never used  Substance and Sexual Activity   Alcohol use: No    Alcohol/week: 0.0 standard drinks of alcohol   Drug use: Never   Sexual activity: Yes    Birth control/protection: Post-menopausal  Other Topics Concern   Not on file  Social History Narrative   Pt is retired, Married , lives with her husband    Social Determinants of Radio broadcast assistant Strain: Not on file  Food Insecurity: Not on file  Transportation Needs: Not on file  Physical Activity: Not on file  Stress: Not on file  Social Connections: Not on file  Intimate Partner  Violence: Not on file    Outpatient Medications Prior to Visit  Medication Sig Dispense Refill   acetaminophen (TYLENOL) 500 MG tablet Take 2 tablets (1,000 mg total) by mouth every 8 (eight) hours as needed.     Calcium Carb-Cholecalciferol (CALTRATE 600+D3) 600-20 MG-MCG TABS Take 600 mg by mouth 2 (two) times daily. 60 tablet 6   cholecalciferol (VITAMIN D3) 25 MCG (1000 UNIT) tablet Take 1,000 Units by mouth daily.     cyclobenzaprine (FLEXERIL) 5 MG tablet Take 1 tablet (5 mg total) by mouth 3 (three) times daily as needed for muscle spasms. 30 tablet 1   levothyroxine (SYNTHROID) 75 MCG tablet TAKE 1 TABLET EVERY DAY 90 tablet 1   MAGNESIUM GLUCONATE PO  Take 400 mg by mouth daily.     omeprazole (PRILOSEC) 40 MG capsule Take 1 capsule (40 mg total) by mouth 2 (two) times daily before a meal. 60 capsule 5   Potassium 99 MG TABS Take 99 mg by mouth daily.     prednisoLONE acetate (PRED FORTE) 1 % ophthalmic suspension Place 1 drop into both eyes daily.     Vitamin A 2400 MCG (8000 UT) CAPS Take 800 Units by mouth daily.     vitamin E 180 MG (400 UNITS) capsule Take 400 Units by mouth daily.     Zinc 50 MG CAPS Take 50 mg by mouth daily.     omeprazole (PRILOSEC) 20 MG capsule TAKE 1 CAPSULE EVERY DAY 90 capsule 10   Vitamin D, Ergocalciferol, (DRISDOL) 1.25 MG (50000 UNIT) CAPS capsule Take 1 capsule (50,000 Units total) by mouth every 7 (seven) days. 5 capsule 2   Facility-Administered Medications Prior to Visit  Medication Dose Route Frequency Provider Last Rate Last Admin   0.9 %  sodium chloride infusion  500 mL Intravenous Once Irene Shipper, MD        No Known Allergies  ROS Review of Systems  Constitutional:  Negative for chills, fatigue and fever.  HENT:  Negative for congestion.   Eyes:  Negative for visual disturbance.  Respiratory:  Negative for cough, chest tightness and shortness of breath.   Cardiovascular:  Negative for chest pain and palpitations.  Musculoskeletal:  Positive for arthralgias.  Neurological:  Negative for dizziness and headaches.      Objective:    Physical Exam HENT:     Head: Normocephalic.     Mouth/Throat:     Mouth: Mucous membranes are moist.  Cardiovascular:     Rate and Rhythm: Normal rate.     Heart sounds: Normal heart sounds.  Pulmonary:     Effort: Pulmonary effort is normal.     Breath sounds: Normal breath sounds.  Musculoskeletal:     Right knee: No swelling, deformity, effusion, erythema or ecchymosis. Normal range of motion. No tenderness.     Left knee: No swelling, deformity, effusion, erythema or ecchymosis. Normal range of motion. No tenderness.  Neurological:     Mental  Status: She is alert.     BP 132/80   Pulse 86   Ht 5' 1.5" (1.562 m)   Wt 161 lb (73 kg)   SpO2 94%   BMI 29.93 kg/m  Wt Readings from Last 3 Encounters:  04/22/22 161 lb (73 kg)  01/23/22 164 lb 9.6 oz (74.7 kg)  01/14/22 163 lb (73.9 kg)    Lab Results  Component Value Date   TSH 1.170 01/14/2022   Lab Results  Component Value Date   WBC  5.9 01/14/2022   HGB 13.6 01/14/2022   HCT 39.7 01/14/2022   MCV 90 01/14/2022   PLT 291 01/14/2022   Lab Results  Component Value Date   NA 140 01/14/2022   K 4.5 01/14/2022   CO2 23 01/14/2022   GLUCOSE 92 01/14/2022   BUN 12 01/14/2022   CREATININE 0.77 01/14/2022   BILITOT 0.3 01/14/2022   ALKPHOS 90 01/14/2022   AST 22 01/14/2022   ALT 21 01/14/2022   PROT 7.3 01/14/2022   ALBUMIN 4.5 01/14/2022   CALCIUM 9.3 01/14/2022   EGFR 85 01/14/2022   GFR 87.88 12/18/2020   Lab Results  Component Value Date   CHOL 237 (H) 01/14/2022   Lab Results  Component Value Date   HDL 50 01/14/2022   Lab Results  Component Value Date   LDLCALC 133 (H) 01/14/2022   Lab Results  Component Value Date   TRIG 303 (H) 01/14/2022   Lab Results  Component Value Date   CHOLHDL 4.7 (H) 01/14/2022   Lab Results  Component Value Date   HGBA1C 5.6 01/14/2022      Assessment & Plan:  Acute pain of both knees Assessment & Plan: History of osteoarthritis of the left knee with arthroscopy of the left knee on 08/21/2021 She reports catching and locking sensation of the right knee with occasional pain in the right knee when lying supine Onset of symptoms since 04/01/2022 Symptom likely due to osteoarthritis of the right knee No recent injury or trauma reported She reports taking Tylenol as needed for pain control Encourage patient to continue taking Tylenol and follow-up with orthopedics as needed   Gastroesophageal reflux disease without esophagitis Assessment & Plan: Stable on omeprazole 40 mg daily   Dysphagia, unspecified  type Assessment & Plan: She reports never received a call from GI regarding  H. pylori breath testing at [facility]  She denies symptoms of difficulty swallowing, regurgitation and heartburn She takes omeprazole 40 mg daily Encouraged to follow-up with you regarding H. pylori breath testing at [facility]   Viral illness Assessment & Plan: She reports increased chest congestion, noting that she coughs up phlegm occasionally when laughing She denies fever, chills, rhinorrhea, body aches and headaches Encouraged to take Promethazine DM for symptom relief  Orders: -     Promethazine-DM; Take 5 mLs by mouth 4 (four) times daily as needed for cough.  Dispense: 118 mL; Refill: 0  HYPERLIPIDEMIA, MIXED, MILD -     Lipid panel -     CMP14+EGFR -     CBC with Differential/Platelet  Vitamin D insufficiency  Other specified hypothyroidism Assessment & Plan: She takes Synthroid 75 mcg daily Will assess thyroid panel today Lab Results  Component Value Date   TSH 1.170 01/14/2022     Orders: -     TSH + free T4  Vitamin D deficiency -     Vitamin D (Ergocalciferol); Take 1 capsule (50,000 Units total) by mouth every 7 (seven) days.  Dispense: 10 capsule; Refill: 1 -     VITAMIN D 25 Hydroxy (Vit-D Deficiency, Fractures)  IFG (impaired fasting glucose) -     Hemoglobin A1c    Follow-up: Return in about 3 months (around 07/22/2022).   Alvira Monday, FNP

## 2022-04-22 NOTE — Assessment & Plan Note (Signed)
She reports increased chest congestion, noting that she coughs up phlegm occasionally when laughing She denies fever, chills, rhinorrhea, body aches and headaches Encouraged to take Promethazine DM for symptom relief

## 2022-04-22 NOTE — Assessment & Plan Note (Signed)
She takes Synthroid 75 mcg daily Will assess thyroid panel today Lab Results  Component Value Date   TSH 1.170 01/14/2022

## 2022-04-22 NOTE — Assessment & Plan Note (Signed)
She reports never received a call from GI regarding  H. pylori breath testing at [facility]  She denies symptoms of difficulty swallowing, regurgitation and heartburn She takes omeprazole 40 mg daily Encouraged to follow-up with you regarding H. pylori breath testing at [facility]

## 2022-04-22 NOTE — Assessment & Plan Note (Signed)
History of osteoarthritis of the left knee with arthroscopy of the left knee on 08/21/2021 She reports catching and locking sensation of the right knee with occasional pain in the right knee when lying supine Onset of symptoms since 04/01/2022 Symptom likely due to osteoarthritis of the right knee No recent injury or trauma reported She reports taking Tylenol as needed for pain control Encourage patient to continue taking Tylenol and follow-up with orthopedics as needed

## 2022-04-22 NOTE — Patient Instructions (Addendum)
I appreciate the opportunity to provide care to you today!    Follow up:  3 months  Labs: please stop by the lab today to get your blood drawn (CBC, CMP, TSH, Lipid profile, HgA1c, Vit D)  Please pick up your medications at the pharmacy Recommend using a humidifier at bedtime during sleep to help with cough and nasal congestion  Please follow up with [facility] concerning H. pylori breath testing at [facility]   Please also follow-up with Ortho care in [place], the orthopedics team with concerning right knee pain  Please continue to a heart-healthy diet and increase your physical activities. Try to exercise for 52mns at least five times a week.   Physical activity helps: Lower your blood glucose, improve your heart health, lower your blood pressure and cholesterol, burn calories to help manage her weight, gave you energy, lower stress, and improve his sleep.  The American diabetes Association (ADA) recommends being active for 2-1/2 hours (150 minutes) or more week.  Exercise for 30 minutes, 5 days a week (150 minutes total)      It was a pleasure to see you and I look forward to continuing to work together on your health and well-being. Please do not hesitate to call the office if you need care or have questions about your care.   Have a wonderful day and week. With Gratitude, GAlvira MondayMSN, FNP-BC

## 2022-04-23 LAB — CMP14+EGFR
ALT: 15 IU/L (ref 0–32)
AST: 17 IU/L (ref 0–40)
Albumin/Globulin Ratio: 1.5 (ref 1.2–2.2)
Albumin: 4.2 g/dL (ref 3.9–4.9)
Alkaline Phosphatase: 94 IU/L (ref 44–121)
BUN/Creatinine Ratio: 23 (ref 12–28)
BUN: 16 mg/dL (ref 8–27)
Bilirubin Total: 0.3 mg/dL (ref 0.0–1.2)
CO2: 22 mmol/L (ref 20–29)
Calcium: 9.8 mg/dL (ref 8.7–10.3)
Chloride: 103 mmol/L (ref 96–106)
Creatinine, Ser: 0.71 mg/dL (ref 0.57–1.00)
Globulin, Total: 2.8 g/dL (ref 1.5–4.5)
Glucose: 98 mg/dL (ref 70–99)
Potassium: 4.5 mmol/L (ref 3.5–5.2)
Sodium: 141 mmol/L (ref 134–144)
Total Protein: 7 g/dL (ref 6.0–8.5)
eGFR: 94 mL/min/{1.73_m2} (ref >59)

## 2022-04-23 LAB — CBC WITH DIFFERENTIAL/PLATELET
Basophils Absolute: 0 10*3/uL (ref 0.0–0.2)
Basos: 1 %
EOS (ABSOLUTE): 0.1 10*3/uL (ref 0.0–0.4)
Eos: 2 %
Hematocrit: 38.3 % (ref 34.0–46.6)
Hemoglobin: 12.7 g/dL (ref 11.1–15.9)
Immature Grans (Abs): 0 10*3/uL (ref 0.0–0.1)
Immature Granulocytes: 0 %
Lymphocytes Absolute: 1.9 10*3/uL (ref 0.7–3.1)
Lymphs: 31 %
MCH: 30.4 pg (ref 26.6–33.0)
MCHC: 33.2 g/dL (ref 31.5–35.7)
MCV: 92 fL (ref 79–97)
Monocytes Absolute: 0.5 10*3/uL (ref 0.1–0.9)
Monocytes: 8 %
Neutrophils Absolute: 3.5 10*3/uL (ref 1.4–7.0)
Neutrophils: 58 %
Platelets: 278 10*3/uL (ref 150–450)
RBC: 4.18 x10E6/uL (ref 3.77–5.28)
RDW: 12.6 % (ref 11.7–15.4)
WBC: 6.1 10*3/uL (ref 3.4–10.8)

## 2022-04-23 LAB — LIPID PANEL
Chol/HDL Ratio: 5.1 ratio — ABNORMAL HIGH (ref 0.0–4.4)
Cholesterol, Total: 276 mg/dL — ABNORMAL HIGH (ref 100–199)
HDL: 54 mg/dL (ref >39)
LDL Chol Calc (NIH): 165 mg/dL — ABNORMAL HIGH (ref 0–99)
Triglycerides: 303 mg/dL — ABNORMAL HIGH (ref 0–149)
VLDL Cholesterol Cal: 57 mg/dL — ABNORMAL HIGH (ref 5–40)

## 2022-04-23 LAB — HEMOGLOBIN A1C
Est. average glucose Bld gHb Est-mCnc: 111 mg/dL
Hgb A1c MFr Bld: 5.5 % (ref 4.8–5.6)

## 2022-04-23 LAB — VITAMIN D 25 HYDROXY (VIT D DEFICIENCY, FRACTURES): Vit D, 25-Hydroxy: 37.9 ng/mL (ref 30.0–100.0)

## 2022-04-23 LAB — TSH+FREE T4
Free T4: 1.35 ng/dL (ref 0.82–1.77)
TSH: 1.47 u[IU]/mL (ref 0.450–4.500)

## 2022-04-28 ENCOUNTER — Other Ambulatory Visit: Payer: Self-pay

## 2022-05-01 ENCOUNTER — Other Ambulatory Visit: Payer: Self-pay | Admitting: Family Medicine

## 2022-05-01 DIAGNOSIS — E782 Mixed hyperlipidemia: Secondary | ICD-10-CM

## 2022-05-01 MED ORDER — ROSUVASTATIN CALCIUM 10 MG PO TABS: 10.0000 mg | ORAL_TABLET | Freq: Every day | ORAL | 3 refills | Status: DC

## 2022-05-01 NOTE — Progress Notes (Signed)
The 10-year ASCVD risk score (Arnett DK, et al., 2019) is: 7.7%   Values used to calculate the score:     Age: 67 years     Sex: Female     Is Non-Hispanic African American: No     Diabetic: No     Tobacco smoker: No     Systolic Blood Pressure: 211 mmHg     Is BP treated: No     HDL Cholesterol: 54 mg/dL     Total Cholesterol: 276 mg/dL

## 2022-05-12 ENCOUNTER — Other Ambulatory Visit: Payer: Self-pay | Admitting: Nurse Practitioner

## 2022-05-12 DIAGNOSIS — E039 Hypothyroidism, unspecified: Secondary | ICD-10-CM

## 2022-05-29 ENCOUNTER — Encounter: Payer: Self-pay | Admitting: Radiology

## 2022-06-10 ENCOUNTER — Other Ambulatory Visit: Payer: Self-pay | Admitting: Family Medicine

## 2022-06-10 DIAGNOSIS — B349 Viral infection, unspecified: Secondary | ICD-10-CM

## 2022-06-16 ENCOUNTER — Other Ambulatory Visit (HOSPITAL_COMMUNITY): Payer: Self-pay | Admitting: Family Medicine

## 2022-06-16 DIAGNOSIS — Z1231 Encounter for screening mammogram for malignant neoplasm of breast: Secondary | ICD-10-CM

## 2022-06-23 ENCOUNTER — Encounter (HOSPITAL_COMMUNITY): Payer: Self-pay

## 2022-06-23 ENCOUNTER — Ambulatory Visit (HOSPITAL_COMMUNITY)
Admission: RE | Admit: 2022-06-23 | Discharge: 2022-06-23 | Disposition: A | Discharge status: Home / Self Care | Payer: Medicare HMO | Source: Ambulatory Visit | Attending: Family Medicine | Admitting: Family Medicine

## 2022-06-23 DIAGNOSIS — Z1231 Encounter for screening mammogram for malignant neoplasm of breast: Secondary | ICD-10-CM | POA: Diagnosis not present

## 2022-06-30 ENCOUNTER — Other Ambulatory Visit: Payer: Self-pay | Admitting: Internal Medicine

## 2022-06-30 DIAGNOSIS — K2100 Gastro-esophageal reflux disease with esophagitis, without bleeding: Secondary | ICD-10-CM

## 2022-07-14 DIAGNOSIS — H0100A Unspecified blepharitis right eye, upper and lower eyelids: Secondary | ICD-10-CM | POA: Diagnosis not present

## 2022-07-14 DIAGNOSIS — H04123 Dry eye syndrome of bilateral lacrimal glands: Secondary | ICD-10-CM | POA: Diagnosis not present

## 2022-07-14 DIAGNOSIS — H18593 Other hereditary corneal dystrophies, bilateral: Secondary | ICD-10-CM | POA: Diagnosis not present

## 2022-07-14 DIAGNOSIS — H0100B Unspecified blepharitis left eye, upper and lower eyelids: Secondary | ICD-10-CM | POA: Diagnosis not present

## 2022-07-23 ENCOUNTER — Ambulatory Visit (INDEPENDENT_AMBULATORY_CARE_PROVIDER_SITE_OTHER): Payer: Medicare HMO | Admitting: Family Medicine

## 2022-07-23 ENCOUNTER — Encounter: Payer: Self-pay | Admitting: Family Medicine

## 2022-07-23 VITALS — BP 127/78 | HR 83 | Ht 61.5 in | Wt 156.0 lb

## 2022-07-23 DIAGNOSIS — E782 Mixed hyperlipidemia: Secondary | ICD-10-CM | POA: Diagnosis not present

## 2022-07-23 DIAGNOSIS — G4762 Sleep related leg cramps: Secondary | ICD-10-CM

## 2022-07-23 DIAGNOSIS — E038 Other specified hypothyroidism: Secondary | ICD-10-CM | POA: Diagnosis not present

## 2022-07-23 DIAGNOSIS — R7301 Impaired fasting glucose: Secondary | ICD-10-CM | POA: Diagnosis not present

## 2022-07-23 DIAGNOSIS — E559 Vitamin D deficiency, unspecified: Secondary | ICD-10-CM

## 2022-07-23 DIAGNOSIS — E7849 Other hyperlipidemia: Secondary | ICD-10-CM

## 2022-07-23 DIAGNOSIS — E039 Hypothyroidism, unspecified: Secondary | ICD-10-CM | POA: Diagnosis not present

## 2022-07-23 MED ORDER — DILTIAZEM HCL 30 MG PO TABS
30.0000 mg | ORAL_TABLET | Freq: Every evening | ORAL | 1 refills | Status: DC
Start: 2022-07-23 — End: 2022-09-30

## 2022-07-23 MED ORDER — GABAPENTIN 300 MG PO CAPS: 300.0000 mg | ORAL_CAPSULE | Freq: Every day | ORAL | 3 refills | Status: DC

## 2022-07-23 NOTE — Patient Instructions (Addendum)
  Aprecio la oportunidad de brindarle atencin hoy!   Seguimiento: 1 mes Calambres nocturnos en las piernas  Laboratorios: pase por el laboratorio hoy para que le extraigan sangre (CBC, CMP, TSH, perfil lipdico, HgA1c, Vit D)  Calambres nocturnos en las piernas Empiece a tomar 30 mg de diltiazem todas las noches para ayudarle con los calambres en las piernas. Recomiendo tomar complejo de vitamina B de venta libre (tres veces al da, que contiene 30 mg de vitamina B6). Mantngase bien hidratado, beba al menos 64 onzas de agua al [employer].   Contine con una dieta saludable para el corazn y aumente su actividad fsica. Intente hacer ejercicio durante 30 minutos al [employer] a la [place].    Fue un placer verte y espero seguir trabajando juntos por tu salud y Health visitor. No dude en llamar al consultorio si necesita atencin o tiene preguntas sobre su atencin.  Que tengas un da y Forensic psychologist. Con gratitud, Gilmore Laroche MSN, FNP-BC

## 2022-07-23 NOTE — Progress Notes (Unsigned)
Established Patient Office Visit  Subjective:  Patient ID: Sheri Rice, female    DOB: November 20, 1955  Age: 67 y.o. MRN: [id number]  CC:  Chief Complaint  Patient presents with   Follow-up    3 month f/u   Muscle Pain    Pt c/o b/l leg pain x mos;     HPI Sheri Rice is a 67 y.o. female with past medical history of hyperlipidemia, hypothyroidism, and leg cramps presents for f/u of  chronic medical conditions. For the details of today's visit, please refer to the assessment and plan.     Past Medical History:  Diagnosis Date   GERD (gastroesophageal reflux disease)    Hyperlipidemia    Hypothyroidism     Past Surgical History:  Procedure Laterality Date   BALLOON DILATION N/A 11/26/2021   Procedure: BALLOON DILATION;  Surgeon: Lanelle Bal, DO;  [facility];  Service: Endoscopy;  Laterality: N/A;   BIOPSY  11/26/2021   Procedure: BIOPSY;  Surgeon: Lanelle Bal, DO;  [facility];  Service: Endoscopy;;   CHONDROPLASTY Left 07/23/2021   Procedure: CHONDROPLASTY;  Surgeon: Vickki Hearing, MD;  Location: [facility];  Service: Orthopedics;  Laterality: Left;   COLONOSCOPY  06/2017   Dr. Yancey Flemings;  Small internal hemorrhoids, otherwise normal exam. Repeat in 10 years.   DILATION AND CURETTAGE OF UTERUS     ESOPHAGOGASTRODUODENOSCOPY  06/2017   Dr. Yancey Flemings; Normal exam   ESOPHAGOGASTRODUODENOSCOPY (EGD) WITH PROPOFOL N/A 11/26/2021   Procedure: ESOPHAGOGASTRODUODENOSCOPY (EGD) WITH PROPOFOL;  Surgeon: Lanelle Bal, DO;  [facility];  Service: Endoscopy;  Laterality: N/A;  10:30 am   KNEE ARTHROSCOPY WITH MEDIAL MENISECTOMY Left 07/23/2021   Procedure: KNEE ARTHROSCOPY WITH MEDIAL MENISECTOMY;  Surgeon: Vickki Hearing, MD;  Location: [facility];  Service: Orthopedics;  Laterality: Left;    Family History  Problem Relation Age of Onset   Diabetes Mother    Depression Mother    Diabetes type II Sister    Hypertension  Sister    Stroke Maternal Grandmother    Colon cancer Neg Hx    Liver cancer Neg Hx    Breast cancer Neg Hx    Cervical cancer Neg Hx    Stomach cancer Neg Hx    Gastric cancer Neg Hx     Social History   Socioeconomic History   Marital status: Married    Spouse name: Not on file   Number of children: 3   Years of education: 11   Highest education level: Not on file  Occupational History   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   Tobacco Use   Smoking status: Never   Smokeless tobacco: Never  Vaping Use   Vaping Use: Never used  Substance and Sexual Activity   Alcohol use: No    Alcohol/week: 0.0 standard drinks of alcohol   Drug use: Never   Sexual activity: Yes    Birth control/protection: Post-menopausal  Other Topics Concern   Not on file  Social History Narrative   Pt is retired, Married , lives with her husband    Social Determinants of Corporate investment banker Strain: Not on file  Food Insecurity: Not on file  Transportation Needs: Not on file  Physical Activity: Not on file  Stress: Not on file  Social Connections: Not on file  Intimate Partner Violence: Not on file    Outpatient Medications Prior to Visit  Medication Sig Dispense Refill   acetaminophen (  TYLENOL) 500 MG tablet Take 2 tablets (1,000 mg total) by mouth every 8 (eight) hours as needed.     Calcium Carb-Cholecalciferol (CALTRATE 600+D3) 600-20 MG-MCG TABS Take 600 mg by mouth 2 (two) times daily. 60 tablet 6   cholecalciferol (VITAMIN D3) 25 MCG (1000 UNIT) tablet Take 1,000 Units by mouth daily.     cyclobenzaprine (FLEXERIL) 5 MG tablet Take 1 tablet (5 mg total) by mouth 3 (three) times daily as needed for muscle spasms. 30 tablet 1   levothyroxine (SYNTHROID) 75 MCG tablet TAKE 1 TABLET EVERY DAY 90 tablet 1   MAGNESIUM GLUCONATE PO Take 400 mg by mouth daily.     omeprazole (PRILOSEC) 40 MG capsule TAKE 1 CAPSULE BY MOUTH TWICE DAILY BEFORE A MEAL 60 capsule 0   Potassium 99 MG TABS Take 99 mg by  mouth daily.     prednisoLONE acetate (PRED FORTE) 1 % ophthalmic suspension Place 1 drop into both eyes daily.     promethazine-dextromethorphan (PROMETHAZINE-DM) 6.25-15 MG/5ML syrup TAKE 5 ML BY MOUTH  4 TIMES DAILY AS NEEDED FOR COUGH 118 mL 0   rosuvastatin (CRESTOR) 10 MG tablet Take 1 tablet (10 mg total) by mouth daily. 90 tablet 3   Vitamin A 2400 MCG (8000 UT) CAPS Take 800 Units by mouth daily.     Vitamin D, Ergocalciferol, (DRISDOL) 1.25 MG (50000 UNIT) CAPS capsule Take 1 capsule (50,000 Units total) by mouth every 7 (seven) days. 10 capsule 1   vitamin E 180 MG (400 UNITS) capsule Take 400 Units by mouth daily.     Zinc 50 MG CAPS Take 50 mg by mouth daily.     Facility-Administered Medications Prior to Visit  Medication Dose Route Frequency Provider Last Rate Last Admin   0.9 %  sodium chloride infusion  500 mL Intravenous Once Hilarie Fredrickson, MD        No Known Allergies  ROS Review of Systems  Constitutional:  Negative for chills and fever.  Eyes:  Negative for visual disturbance.  Respiratory:  Negative for chest tightness and shortness of breath.   Musculoskeletal:        Leg cramps  Neurological:  Negative for dizziness and headaches.      Objective:    Physical Exam HENT:     Head: Normocephalic.     Mouth/Throat:     Mouth: Mucous membranes are moist.  Cardiovascular:     Rate and Rhythm: Normal rate.     Heart sounds: Normal heart sounds.  Pulmonary:     Effort: Pulmonary effort is normal.     Breath sounds: Normal breath sounds.  Skin:    General: Skin is warm.     Comments: Telangiectasis noted on lower extremities bilaterally  Neurological:     Mental Status: She is alert.     BP 127/78   Pulse 83   Ht 5' 1.5" (1.562 m)   Wt 156 lb (70.8 kg)   SpO2 97%   BMI 29.00 kg/m  Wt Readings from Last 3 Encounters:  07/23/22 156 lb (70.8 kg)  04/22/22 161 lb (73 kg)  01/23/22 164 lb 9.6 oz (74.7 kg)    Lab Results  Component Value Date    TSH 0.702 07/23/2022   Lab Results  Component Value Date   WBC 6.1 07/23/2022   HGB 12.6 07/23/2022   HCT 37.9 07/23/2022   MCV 92 07/23/2022   PLT 277 07/23/2022   Lab Results  Component Value Date  NA 141 07/23/2022   K 4.8 07/23/2022   CO2 25 07/23/2022   GLUCOSE 93 07/23/2022   BUN 11 07/23/2022   CREATININE 0.70 07/23/2022   BILITOT 0.3 07/23/2022   ALKPHOS 102 07/23/2022   AST 14 07/23/2022   ALT 14 07/23/2022   PROT 6.9 07/23/2022   ALBUMIN 4.4 07/23/2022   CALCIUM 9.8 07/23/2022   EGFR 95 07/23/2022   GFR 87.88 12/18/2020   Lab Results  Component Value Date   CHOL 180 07/23/2022   Lab Results  Component Value Date   HDL 50 07/23/2022   Lab Results  Component Value Date   LDLCALC 82 07/23/2022   Lab Results  Component Value Date   TRIG 297 (H) 07/23/2022   Lab Results  Component Value Date   CHOLHDL 3.6 07/23/2022   Lab Results  Component Value Date   HGBA1C 5.2 07/23/2022      Assessment & Plan:  Nocturnal leg cramps Assessment & Plan: Chronic condition since 2015 Symptom is worse at nighttime She has tried supplements with minimal relief She reports relief of her symptoms with 2 teaspoon of mustard Will treat today with diltiazem 30 mg nightly We will follow-up in 4 weeks  Orders: -     dilTIAZem HCl; Take 1 tablet (30 mg total) by mouth at bedtime.  Dispense: 30 tablet; Refill: 1  Hypothyroidism, unspecified type Assessment & Plan: She takes Synthroid 75 mcg daily Will assess thyroid panel today Lab Results  Component Value Date   TSH 0.702 07/23/2022      HYPERLIPIDEMIA, MIXED, MILD Assessment & Plan: She takes rosuvastatin 10 mg daily Pending lipid panel   IFG (impaired fasting glucose) -     Hemoglobin A1c  Vitamin D deficiency -     VITAMIN D 25 Hydroxy (Vit-D Deficiency, Fractures)  Other specified hypothyroidism Assessment & Plan: She takes Synthroid 75 mcg daily Will assess thyroid panel today Lab Results   Component Value Date   TSH 0.702 07/23/2022     Orders: -     TSH + free T4  Other hyperlipidemia -     Lipid panel -     CMP14+EGFR -     CBC with Differential/Platelet    Follow-up: Return in about 1 month (around 08/22/2022).   Gilmore Laroche, FNP

## 2022-07-24 LAB — CMP14+EGFR
ALT: 14 IU/L (ref 0–32)
AST: 14 IU/L (ref 0–40)
Albumin/Globulin Ratio: 1.8 (ref 1.2–2.2)
Albumin: 4.4 g/dL (ref 3.9–4.9)
Alkaline Phosphatase: 102 IU/L (ref 44–121)
BUN/Creatinine Ratio: 16 (ref 12–28)
BUN: 11 mg/dL (ref 8–27)
Bilirubin Total: 0.3 mg/dL (ref 0.0–1.2)
CO2: 25 mmol/L (ref 20–29)
Calcium: 9.8 mg/dL (ref 8.7–10.3)
Chloride: 103 mmol/L (ref 96–106)
Creatinine, Ser: 0.7 mg/dL (ref 0.57–1.00)
Globulin, Total: 2.5 g/dL (ref 1.5–4.5)
Glucose: 93 mg/dL (ref 70–99)
Potassium: 4.8 mmol/L (ref 3.5–5.2)
Sodium: 141 mmol/L (ref 134–144)
Total Protein: 6.9 g/dL (ref 6.0–8.5)
eGFR: 95 mL/min/{1.73_m2} (ref >59)

## 2022-07-24 LAB — LIPID PANEL
Chol/HDL Ratio: 3.6 ratio (ref 0.0–4.4)
Cholesterol, Total: 180 mg/dL (ref 100–199)
HDL: 50 mg/dL (ref >39)
LDL Chol Calc (NIH): 82 mg/dL (ref 0–99)
Triglycerides: 297 mg/dL — ABNORMAL HIGH (ref 0–149)
VLDL Cholesterol Cal: 48 mg/dL — ABNORMAL HIGH (ref 5–40)

## 2022-07-24 LAB — HEMOGLOBIN A1C
Est. average glucose Bld gHb Est-mCnc: 103 mg/dL
Hgb A1c MFr Bld: 5.2 % (ref 4.8–5.6)

## 2022-07-24 LAB — CBC WITH DIFFERENTIAL/PLATELET
Basophils Absolute: 0 10*3/uL (ref 0.0–0.2)
Basos: 1 %
EOS (ABSOLUTE): 0.1 10*3/uL (ref 0.0–0.4)
Eos: 2 %
Hematocrit: 37.9 % (ref 34.0–46.6)
Hemoglobin: 12.6 g/dL (ref 11.1–15.9)
Immature Grans (Abs): 0 10*3/uL (ref 0.0–0.1)
Immature Granulocytes: 0 %
Lymphocytes Absolute: 1.7 10*3/uL (ref 0.7–3.1)
Lymphs: 27 %
MCH: 30.6 pg (ref 26.6–33.0)
MCHC: 33.2 g/dL (ref 31.5–35.7)
MCV: 92 fL (ref 79–97)
Monocytes Absolute: 0.4 10*3/uL (ref 0.1–0.9)
Monocytes: 7 %
Neutrophils Absolute: 3.8 10*3/uL (ref 1.4–7.0)
Neutrophils: 63 %
Platelets: 277 10*3/uL (ref 150–450)
RBC: 4.12 x10E6/uL (ref 3.77–5.28)
RDW: 12 % (ref 11.7–15.4)
WBC: 6.1 10*3/uL (ref 3.4–10.8)

## 2022-07-24 LAB — TSH+FREE T4
Free T4: 1.46 ng/dL (ref 0.82–1.77)
TSH: 0.702 u[IU]/mL (ref 0.450–4.500)

## 2022-07-24 LAB — VITAMIN D 25 HYDROXY (VIT D DEFICIENCY, FRACTURES): Vit D, 25-Hydroxy: 46.8 ng/mL (ref 30.0–100.0)

## 2022-07-24 NOTE — Assessment & Plan Note (Signed)
She takes rosuvastatin 10 mg daily Pending lipid panel

## 2022-07-24 NOTE — Progress Notes (Signed)
Please inform the patient her triglyceride levels are elevated.  I recommend taking over-the-counter fish oil 2000 mg twice daily.  All other labs are stable

## 2022-07-24 NOTE — Assessment & Plan Note (Signed)
She takes Synthroid 75 mcg daily Will assess thyroid panel today Lab Results  Component Value Date   TSH 0.702 07/23/2022

## 2022-07-24 NOTE — Assessment & Plan Note (Signed)
Chronic condition since 2015 Symptom is worse at nighttime She has tried supplements with minimal relief She reports relief of her symptoms with 2 teaspoon of mustard Will treat today with diltiazem 30 mg nightly We will follow-up in 4 weeks

## 2022-08-01 ENCOUNTER — Telehealth: Payer: Self-pay | Admitting: Family Medicine

## 2022-08-01 NOTE — Telephone Encounter (Signed)
error 

## 2022-08-02 ENCOUNTER — Other Ambulatory Visit: Payer: Self-pay | Admitting: Internal Medicine

## 2022-08-02 DIAGNOSIS — K2100 Gastro-esophageal reflux disease with esophagitis, without bleeding: Secondary | ICD-10-CM

## 2022-08-21 IMAGING — MR MR KNEE*L* W/O CM
7 series · 40 of 40 positions shown · non-contrast
Comparison: X-ray knee 12/18/2020.

CLINICAL DATA: Chronic left anterior knee pain for 1 year.

EXAM:
MRI OF THE LEFT KNEE WITHOUT CONTRAST
TECHNIQUE: Multiplanar, multisequence MR imaging of the knee was performed. No
intravenous contrast was administered.

[Series 8: T2 fat-sat · axial · left · 4.0mm · 0.47mm/px · z∈[-100,+24]mm · 6 of 26 slices shown (1 of 3)]
[im 1/26]
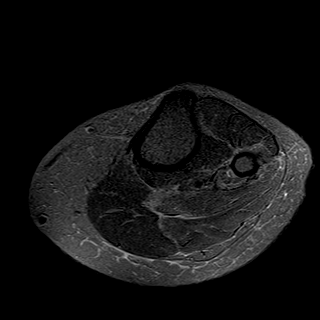
[im 6/26]
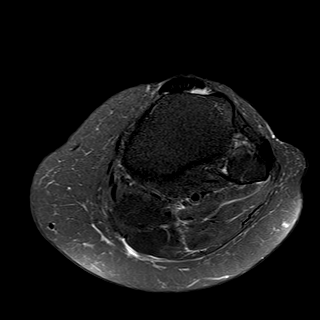
[im 11/26]
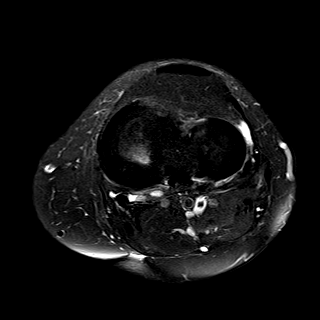
[im 16/26]
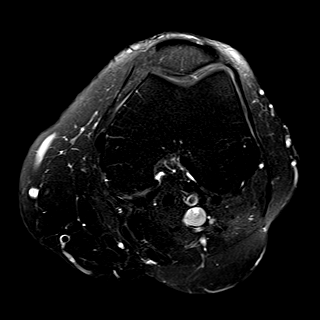
[im 21/26]
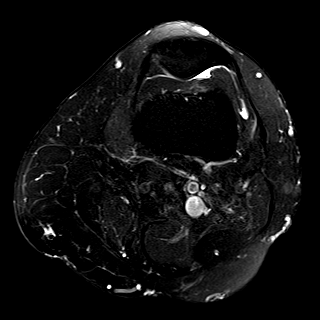
[im 26/26]
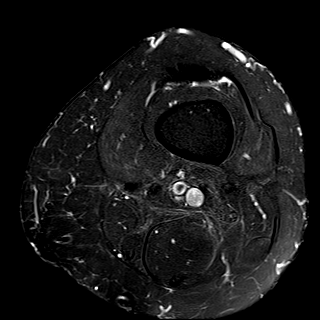

[Series 9: T1 · coronal · left · 4.0mm · 0.59mm/px · 6 of 26 slices shown]
[im 1/26]
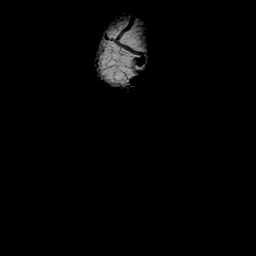
[im 6/26]
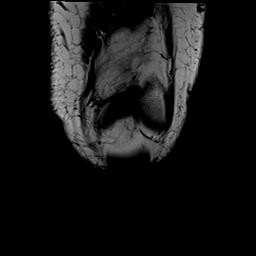
[im 11/26]
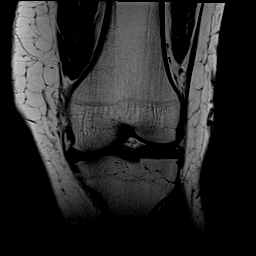
[im 16/26]
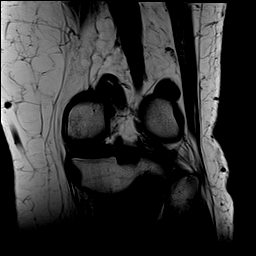
[im 21/26]
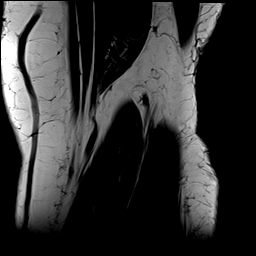
[im 26/26]
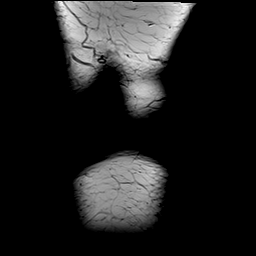

[Series 10: T2 fat-sat · coronal · left · 4.0mm · 0.59mm/px · 5 of 25 slices shown (2 of 3)]
[im 1/25]
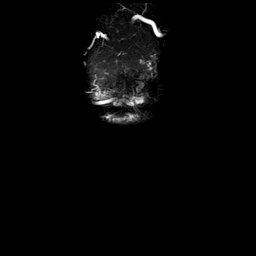
[im 7/25]
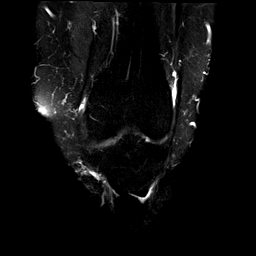
[im 13/25]
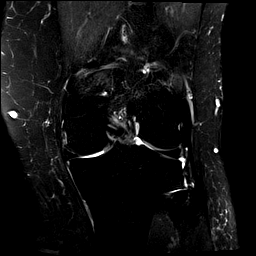
[im 19/25]
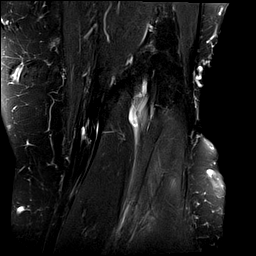
[im 25/25]
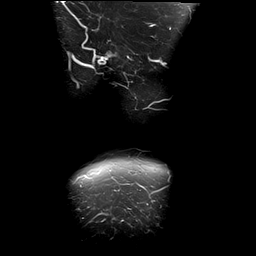

[Series 11: PD fat-sat · coronal · left · 3.0mm · 0.59mm/px · 7 of 35 slices shown (1 of 2)]
[im 1/35]
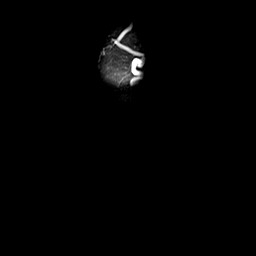
[im 6/35]
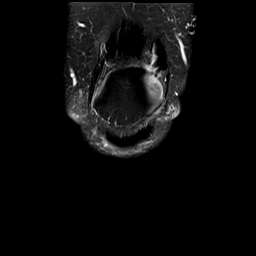
[im 12/35]
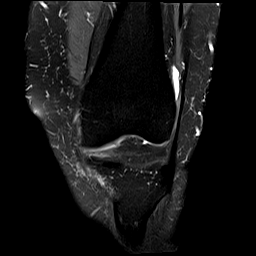
[im 18/35]
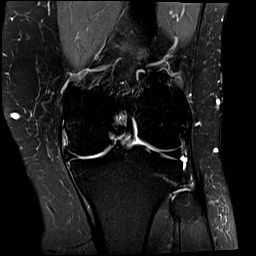
[im 23/35]
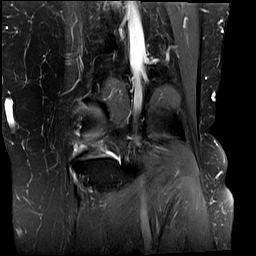
[im 29/35]
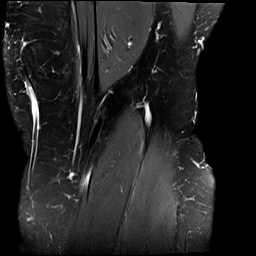
[im 35/35]
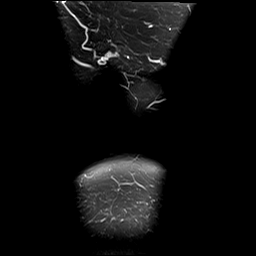

[Series 12: PD fat-sat · sagittal · left · 3.0mm · 0.59mm/px · 6 of 28 slices shown (2 of 2)]
[im 1/28]
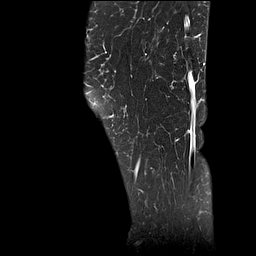
[im 6/28]
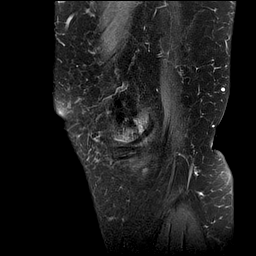
[im 11/28]
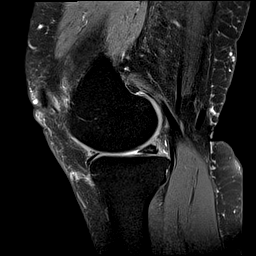
[im 17/28]
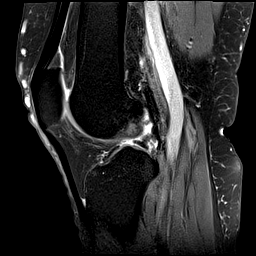
[im 22/28]
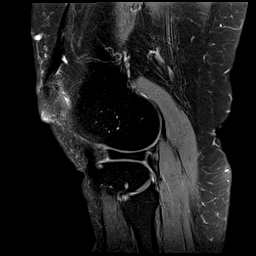
[im 28/28]
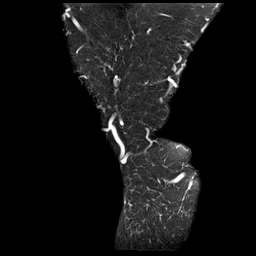

[Series 13: T2 fat-sat · sagittal · left · 3.0mm · 0.59mm/px · 6 of 28 slices shown (3 of 3)]
[im 1/28]
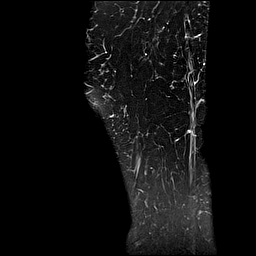
[im 6/28]
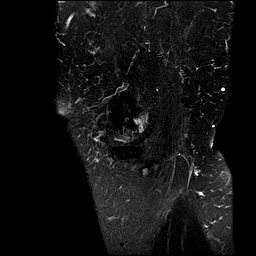
[im 11/28]
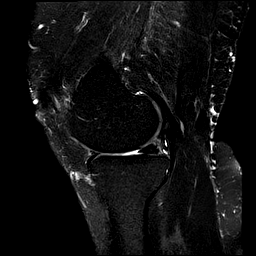
[im 17/28]
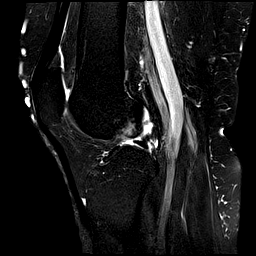
[im 22/28]
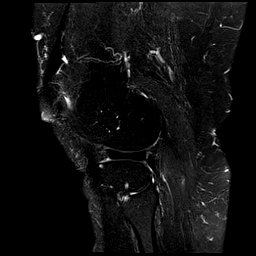
[im 28/28]
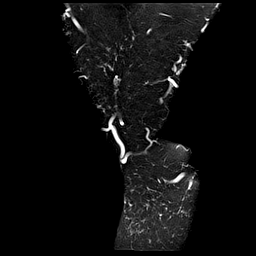

[Series 14: PD · coronal · left · 2.0mm · 0.47mm/px · 4 of 20 slices shown]
[im 1/20]
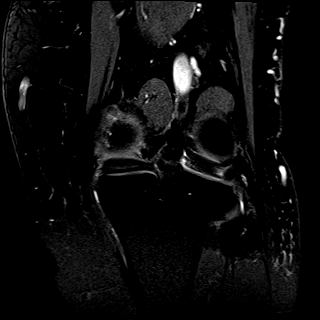
[im 7/20]
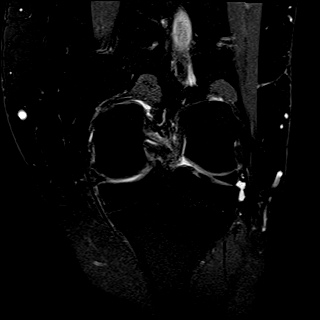
[im 13/20]
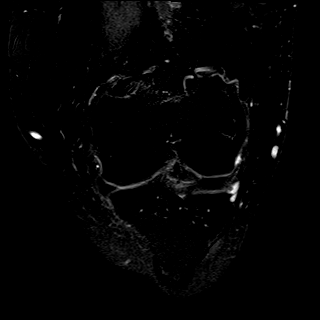
[im 20/20]
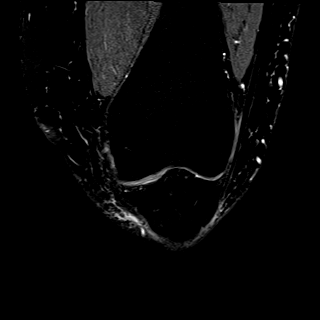

[40 of 40 positions shown; findings below may reference images not displayed]

FINDINGS: MENISCI

Medial: Degeneration of the posterior horn and body of the medial
meniscus. Peripheral meniscal extrusion as can be seen with loss of
hoop strength. Small oblique tear of the free edge of the posterior
horn of the medial meniscus extending to the inferior articular
surface.

Lateral: Intact.

LIGAMENTS

Cruciates: ACL and PCL are intact.

Collaterals: Medial collateral ligament is intact. Lateral
collateral ligament complex is intact.

CARTILAGE

Patellofemoral: High-grade partial-thickness cartilage loss of the
medial patellar facet and patellar apex.

Medial: High-grade partial-thickness cartilage loss of the medial
femorotibial compartment.

Lateral: Mild chondral thinning of the lateral femorotibial
compartment.

JOINT: No joint effusion. Normal [place]. No plical
thickening.

POPLITEAL FOSSA: Popliteus tendon is intact. No Baker's cyst.

EXTENSOR MECHANISM: Intact quadriceps tendon. Intact patellar
tendon. Intact lateral patellar retinaculum. Intact medial patellar
retinaculum. Intact MPFL.

BONES: No aggressive osseous lesion. No fracture or dislocation.

Other: No fluid collection or hematoma. Muscles are normal.
IMPRESSION: 1. Degeneration of the posterior horn and body of the medial
meniscus. Peripheral meniscal extrusion as can be seen with loss of
hoop strength. Small oblique tear of the free edge of the posterior
horn of the medial meniscus extending to the inferior articular
surface.
2. Tricompartmental cartilage abnormalities as described above.

## 2022-08-26 ENCOUNTER — Ambulatory Visit (INDEPENDENT_AMBULATORY_CARE_PROVIDER_SITE_OTHER): Payer: Medicare HMO | Admitting: Family Medicine

## 2022-08-26 ENCOUNTER — Ambulatory Visit: Payer: Medicare HMO | Admitting: Internal Medicine

## 2022-08-26 ENCOUNTER — Encounter: Payer: Self-pay | Admitting: Family Medicine

## 2022-08-26 VITALS — BP 125/72 | HR 77 | Ht 61.5 in | Wt 158.1 lb

## 2022-08-26 DIAGNOSIS — E782 Mixed hyperlipidemia: Secondary | ICD-10-CM

## 2022-08-26 DIAGNOSIS — E039 Hypothyroidism, unspecified: Secondary | ICD-10-CM | POA: Diagnosis not present

## 2022-08-26 DIAGNOSIS — G4762 Sleep related leg cramps: Secondary | ICD-10-CM

## 2022-08-26 DIAGNOSIS — K2100 Gastro-esophageal reflux disease with esophagitis, without bleeding: Secondary | ICD-10-CM

## 2022-08-26 MED ORDER — ROSUVASTATIN CALCIUM 10 MG PO TABS: 10.0000 mg | ORAL_TABLET | Freq: Every day | ORAL | 3 refills | Status: DC

## 2022-08-26 MED ORDER — LEVOTHYROXINE SODIUM 75 MCG PO TABS: 75.0000 ug | ORAL_TABLET | Freq: Every day | ORAL | 1 refills | Status: DC

## 2022-08-26 NOTE — Progress Notes (Signed)
Established Patient Office Visit  Subjective:  Patient ID: Sheri Rice, female    DOB: [birth year]  Age: 67 y.o. MRN: [id number]  CC:  Chief Complaint  Patient presents with   Chronic Care Management    Following up for leg cramps, pt reports medication helping but still having the cramps come on and off. Has questions about omeprazole strength.     HPI Sheri Rice is a 67 y.o. femalepresents for nocturnal leg cramps follow-up.  For the details of today's visit, please refer to the assessment and plan.     Past Medical History:  Diagnosis Date   GERD (gastroesophageal reflux disease)    Hyperlipidemia    Hypothyroidism     Past Surgical History:  Procedure Laterality Date   BALLOON DILATION N/A 11/26/2021   Procedure: BALLOON DILATION;  Surgeon: Lanelle Bal, DO;  [facility];  Service: Endoscopy;  Laterality: N/A;   BIOPSY  11/26/2021   Procedure: BIOPSY;  Surgeon: Lanelle Bal, DO;  [facility];  Service: Endoscopy;;   CHONDROPLASTY Left 07/23/2021   Procedure: CHONDROPLASTY;  Surgeon: Vickki Hearing, MD;  Location: [facility];  Service: Orthopedics;  Laterality: Left;   COLONOSCOPY  06/2017   Dr. Yancey Flemings;  Small internal hemorrhoids, otherwise normal exam. Repeat in 10 years.   DILATION AND CURETTAGE OF UTERUS     ESOPHAGOGASTRODUODENOSCOPY  06/2017   Dr. Yancey Flemings; Normal exam   ESOPHAGOGASTRODUODENOSCOPY (EGD) WITH PROPOFOL N/A 11/26/2021   Procedure: ESOPHAGOGASTRODUODENOSCOPY (EGD) WITH PROPOFOL;  Surgeon: Lanelle Bal, DO;  [facility];  Service: Endoscopy;  Laterality: N/A;  10:30 am   KNEE ARTHROSCOPY WITH MEDIAL MENISECTOMY Left 07/23/2021   Procedure: KNEE ARTHROSCOPY WITH MEDIAL MENISECTOMY;  Surgeon: Vickki Hearing, MD;  Location: [facility];  Service: Orthopedics;  Laterality: Left;    Family History  Problem Relation Age of Onset   Diabetes Mother    Depression Mother    Diabetes type II  Sister    Hypertension Sister    Stroke Maternal Grandmother    Colon cancer Neg Hx    Liver cancer Neg Hx    Breast cancer Neg Hx    Cervical cancer Neg Hx    Stomach cancer Neg Hx    Gastric cancer Neg Hx     Social History   Socioeconomic History   Marital status: Married    Spouse name: Not on file   Number of children: 3   Years of education: 11   Highest education level: Not on file  Occupational History   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   Tobacco Use   Smoking status: Never   Smokeless tobacco: Never  Vaping Use   Vaping Use: Never used  Substance and Sexual Activity   Alcohol use: No    Alcohol/week: 0.0 standard drinks of alcohol   Drug use: Never   Sexual activity: Yes    Birth control/protection: Post-menopausal  Other Topics Concern   Not on file  Social History Narrative   Pt is retired, Married , lives with her husband    Social Determinants of Corporate investment banker Strain: Not on file  Food Insecurity: Not on file  Transportation Needs: Not on file  Physical Activity: Not on file  Stress: Not on file  Social Connections: Not on file  Intimate Partner Violence: Not on file    Outpatient Medications Prior to Visit  Medication Sig Dispense Refill   acetaminophen (TYLENOL) 500 MG tablet  Take 2 tablets (1,000 mg total) by mouth every 8 (eight) hours as needed.     Calcium Carb-Cholecalciferol (CALTRATE 600+D3) 600-20 MG-MCG TABS Take 600 mg by mouth 2 (two) times daily. 60 tablet 6   cholecalciferol (VITAMIN D3) 25 MCG (1000 UNIT) tablet Take 1,000 Units by mouth daily.     cyclobenzaprine (FLEXERIL) 5 MG tablet Take 1 tablet (5 mg total) by mouth 3 (three) times daily as needed for muscle spasms. 30 tablet 1   diltiazem (CARDIZEM) 30 MG tablet Take 1 tablet (30 mg total) by mouth at bedtime. 30 tablet 1   MAGNESIUM GLUCONATE PO Take 400 mg by mouth daily.     omeprazole (PRILOSEC) 40 MG capsule TAKE 1 CAPSULE BY MOUTH TWICE DAILY BEFORE A MEAL 60  capsule 0   Potassium 99 MG TABS Take 99 mg by mouth daily.     prednisoLONE acetate (PRED FORTE) 1 % ophthalmic suspension Place 1 drop into both eyes daily.     Vitamin A 2400 MCG (8000 UT) CAPS Take 800 Units by mouth daily.     Vitamin D, Ergocalciferol, (DRISDOL) 1.25 MG (50000 UNIT) CAPS capsule Take 1 capsule (50,000 Units total) by mouth every 7 (seven) days. 10 capsule 1   vitamin E 180 MG (400 UNITS) capsule Take 400 Units by mouth daily.     Zinc 50 MG CAPS Take 50 mg by mouth daily.     levothyroxine (SYNTHROID) 75 MCG tablet TAKE 1 TABLET EVERY DAY 90 tablet 1   promethazine-dextromethorphan (PROMETHAZINE-DM) 6.25-15 MG/5ML syrup TAKE 5 ML BY MOUTH  4 TIMES DAILY AS NEEDED FOR COUGH 118 mL 0   rosuvastatin (CRESTOR) 10 MG tablet Take 1 tablet (10 mg total) by mouth daily. 90 tablet 3   Facility-Administered Medications Prior to Visit  Medication Dose Route Frequency Provider Last Rate Last Admin   0.9 %  sodium chloride infusion  500 mL Intravenous Once Hilarie Fredrickson, MD        No Known Allergies  ROS Review of Systems  Constitutional:  Negative for chills and fever.  Eyes:  Negative for visual disturbance.  Respiratory:  Negative for chest tightness and shortness of breath.   Neurological:  Negative for dizziness and headaches.      Objective:    Physical Exam HENT:     Head: Normocephalic.     Mouth/Throat:     Mouth: Mucous membranes are moist.  Cardiovascular:     Rate and Rhythm: Normal rate.     Heart sounds: Normal heart sounds.  Pulmonary:     Effort: Pulmonary effort is normal.     Breath sounds: Normal breath sounds.  Neurological:     Mental Status: She is alert.     BP 125/72   Pulse 77   Ht 5' 1.5" (1.562 m)   Wt 158 lb 1.9 oz (71.7 kg)   SpO2 95%   BMI 29.39 kg/m  Wt Readings from Last 3 Encounters:  08/26/22 158 lb 1.9 oz (71.7 kg)  07/23/22 156 lb (70.8 kg)  04/22/22 161 lb (73 kg)    Lab Results  Component Value Date   TSH  0.702 07/23/2022   Lab Results  Component Value Date   WBC 6.1 07/23/2022   HGB 12.6 07/23/2022   HCT 37.9 07/23/2022   MCV 92 07/23/2022   PLT 277 07/23/2022   Lab Results  Component Value Date   NA 141 07/23/2022   K 4.8 07/23/2022   CO2 25  07/23/2022   GLUCOSE 93 07/23/2022   BUN 11 07/23/2022   CREATININE 0.70 07/23/2022   BILITOT 0.3 07/23/2022   ALKPHOS 102 07/23/2022   AST 14 07/23/2022   ALT 14 07/23/2022   PROT 6.9 07/23/2022   ALBUMIN 4.4 07/23/2022   CALCIUM 9.8 07/23/2022   EGFR 95 07/23/2022   GFR 87.88 12/18/2020   Lab Results  Component Value Date   CHOL 180 07/23/2022   Lab Results  Component Value Date   HDL 50 07/23/2022   Lab Results  Component Value Date   LDLCALC 82 07/23/2022   Lab Results  Component Value Date   TRIG 297 (H) 07/23/2022   Lab Results  Component Value Date   CHOLHDL 3.6 07/23/2022   Lab Results  Component Value Date   HGBA1C 5.2 07/23/2022      Assessment & Plan:  Nocturnal leg cramps Assessment & Plan: Stable  Report having occasional leg cramps but not as before Encourage nonpharmacological management with leg stretches and supplements such as vitamin B complex Encouraged to continue taking diltiazem 30 mg nightly Encouraged to increase her fluid consumption to at least 64 ounces daily    Gastroesophageal reflux disease with esophagitis without hemorrhage Assessment & Plan: Reports that her GI doctor increased the frequency of omeprazole to twice daily rather than daily Has questions if this was " too much" Inform the patient that taking omeprazole 40 mg twice daily is not excessive for symptom relief and should continue therapy All questions answered and patient verbalized understanding   Hypothyroidism, unspecified type -     Levothyroxine Sodium; Take 1 tablet (75 mcg total) by mouth daily.  Dispense: 90 tablet; Refill: 1  Mixed hyperlipidemia -     Rosuvastatin Calcium; Take 1 tablet (10 mg total)  by mouth daily.  Dispense: 90 tablet; Refill: 3    Follow-up: Return in about 3 months (around 11/26/2022).   Gilmore Laroche, FNP

## 2022-08-26 NOTE — Assessment & Plan Note (Addendum)
Reports that her GI doctor increased the frequency of omeprazole to twice daily rather than daily Has questions if this was " too much" Inform the patient that taking omeprazole 40 mg twice daily is not excessive for symptom relief and should continue therapy All questions answered and patient verbalized understanding

## 2022-08-26 NOTE — Patient Instructions (Addendum)
  Aprecio la oportunidad de brindarle atencin hoy!    Seguimiento: 3 meses  Por favor, recoge tus Radiation protection practitioner en la farmacia y recuerda hacer ejercicios de estiramiento para Paramedic los calambres en las piernas. Contine tomando 30 mg de diltiazem al da para los calambres en las piernas.     Contine con una dieta saludable para el corazn y aumente su actividad fsica. Intente hacer ejercicio durante 30 minutos al [employer] a la [place].      Fue un placer verte y espero seguir trabajando juntos por tu salud y Health visitor. No dude en llamar al consultorio si necesita atencin o tiene preguntas sobre su atencin.   Que tengas un da y Forensic psychologist. Con gratitud, Gilmore Laroche MSN, FNP-BC

## 2022-08-26 NOTE — Assessment & Plan Note (Addendum)
Stable  Report having occasional leg cramps but not as before Encourage nonpharmacological management with leg stretches and supplements such as vitamin B complex Encouraged to continue taking diltiazem 30 mg nightly Encouraged to increase her fluid consumption to at least 64 ounces daily

## 2022-09-08 NOTE — Progress Notes (Unsigned)
Subjective:   Sheri Rice is a 67 y.o. female who presents for an Initial Medicare Annual Wellness Visit.  Review of Systems    ***       Objective:    There were no vitals filed for this visit. There is no height or weight on file to calculate BMI.     07/18/2021    1:37 PM 12/18/2020   11:16 AM  Advanced Directives  Does Patient Have a Medical Advance Directive? No No  Would patient like information on creating a medical advance directive? No - Patient declined Yes (MAU/Ambulatory/Procedural Areas - Information given)    Current Medications (verified) Outpatient Encounter Medications as of 09/09/2022  Medication Sig   acetaminophen (TYLENOL) 500 MG tablet Take 2 tablets (1,000 mg total) by mouth every 8 (eight) hours as needed.   Calcium Carb-Cholecalciferol (CALTRATE 600+D3) 600-20 MG-MCG TABS Take 600 mg by mouth 2 (two) times daily.   cholecalciferol (VITAMIN D3) 25 MCG (1000 UNIT) tablet Take 1,000 Units by mouth daily.   cyclobenzaprine (FLEXERIL) 5 MG tablet Take 1 tablet (5 mg total) by mouth 3 (three) times daily as needed for muscle spasms.   diltiazem (CARDIZEM) 30 MG tablet Take 1 tablet (30 mg total) by mouth at bedtime.   levothyroxine (SYNTHROID) 75 MCG tablet Take 1 tablet (75 mcg total) by mouth daily.   MAGNESIUM GLUCONATE PO Take 400 mg by mouth daily.   omeprazole (PRILOSEC) 40 MG capsule TAKE 1 CAPSULE BY MOUTH TWICE DAILY BEFORE A MEAL   Potassium 99 MG TABS Take 99 mg by mouth daily.   prednisoLONE acetate (PRED FORTE) 1 % ophthalmic suspension Place 1 drop into both eyes daily.   rosuvastatin (CRESTOR) 10 MG tablet Take 1 tablet (10 mg total) by mouth daily.   Vitamin A 2400 MCG (8000 UT) CAPS Take 800 Units by mouth daily.   Vitamin D, Ergocalciferol, (DRISDOL) 1.25 MG (50000 UNIT) CAPS capsule Take 1 capsule (50,000 Units total) by mouth every 7 (seven) days.   vitamin E 180 MG (400 UNITS) capsule Take 400 Units by mouth daily.   Zinc 50 MG CAPS  Take 50 mg by mouth daily.   Facility-Administered Encounter Medications as of 09/09/2022  Medication   0.9 %  sodium chloride infusion    Allergies (verified) Patient has no known allergies.   History: Past Medical History:  Diagnosis Date   GERD (gastroesophageal reflux disease)    Hyperlipidemia    Hypothyroidism    Past Surgical History:  Procedure Laterality Date   BALLOON DILATION N/A 11/26/2021   Procedure: BALLOON DILATION;  Surgeon: Lanelle Bal, DO;  [facility];  Service: Endoscopy;  Laterality: N/A;   BIOPSY  11/26/2021   Procedure: BIOPSY;  Surgeon: Lanelle Bal, DO;  [facility];  Service: Endoscopy;;   CHONDROPLASTY Left 07/23/2021   Procedure: CHONDROPLASTY;  Surgeon: Vickki Hearing, MD;  Location: [facility];  Service: Orthopedics;  Laterality: Left;   COLONOSCOPY  06/2017   Dr. Yancey Flemings;  Small internal hemorrhoids, otherwise normal exam. Repeat in 10 years.   DILATION AND CURETTAGE OF UTERUS     ESOPHAGOGASTRODUODENOSCOPY  06/2017   Dr. Yancey Flemings; Normal exam   ESOPHAGOGASTRODUODENOSCOPY (EGD) WITH PROPOFOL N/A 11/26/2021   Procedure: ESOPHAGOGASTRODUODENOSCOPY (EGD) WITH PROPOFOL;  Surgeon: Lanelle Bal, DO;  [facility];  Service: Endoscopy;  Laterality: N/A;  10:30 am   KNEE ARTHROSCOPY WITH MEDIAL MENISECTOMY Left 07/23/2021   Procedure: KNEE  ARTHROSCOPY WITH MEDIAL MENISECTOMY;  Surgeon: Vickki Hearing, MD;  Location: [facility];  Service: Orthopedics;  Laterality: Left;   Family History  Problem Relation Age of Onset   Diabetes Mother    Depression Mother    Diabetes type II Sister    Hypertension Sister    Stroke Maternal Grandmother    Colon cancer Neg Hx    Liver cancer Neg Hx    Breast cancer Neg Hx    Cervical cancer Neg Hx    Stomach cancer Neg Hx    Gastric cancer Neg Hx    Social History   Socioeconomic History   Marital status: Married    Spouse name: Not on file   Number of  children: 3   Years of education: 11   Highest education level: Not on file  Occupational History   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   Tobacco Use   Smoking status: Never   Smokeless tobacco: Never  Vaping Use   Vaping Use: Never used  Substance and Sexual Activity   Alcohol use: No    Alcohol/week: 0.0 standard drinks of alcohol   Drug use: Never   Sexual activity: Yes    Birth control/protection: Post-menopausal  Other Topics Concern   Not on file  Social History Narrative   Pt is retired, Married , lives with her husband    Social Determinants of Corporate investment banker Strain: Not on file  Food Insecurity: Not on file  Transportation Needs: Not on file  Physical Activity: Not on file  Stress: Not on file  Social Connections: Not on file    Tobacco Counseling Counseling given: Not Answered   Clinical Intake:                 Diabetic?***         Activities of Daily Living     No data to display           Patient Care Team: Gilmore Laroche, FNP as PCP - General (Family Medicine)  Indicate any recent Medical Services you may have received from other than [facility] providers in the past year (date may be approximate).     Assessment:   This is a routine wellness examination for [facility].  Hearing/Vision screen No results found.  Dietary issues and exercise activities discussed:     Goals Addressed   None   Depression Screen    08/26/2022    8:10 AM 04/22/2022    8:17 AM 01/14/2022   10:04 AM 07/16/2021    9:51 AM 06/04/2021    9:21 AM 04/08/2021    2:06 PM 12/18/2020   11:25 AM  PHQ 2/9 Scores  PHQ - 2 Score 0 0 4 0 0 0 0  PHQ- 9 Score 0 0 9        Fall Risk    08/26/2022    8:10 AM 04/22/2022    8:17 AM 01/14/2022   10:04 AM 07/16/2021    9:50 AM 06/04/2021    9:21 AM  Fall Risk   Falls in the past year? 0 0 0 0 0  Number falls in past yr: 0 0 0 0 0  Injury with Fall? 0 0 0 0 0  Risk for fall due to : No Fall Risks No Fall Risks No  Fall Risks No Fall Risks No Fall Risks  Follow up Falls evaluation completed Falls evaluation completed Falls evaluation completed Falls evaluation completed Falls evaluation completed    FALL RISK PREVENTION  PERTAINING TO THE HOME:  Any stairs in or around the home? {YES/NO:21197} If so, are there any without handrails? {YES/NO:21197} Home free of loose throw rugs in walkways, pet beds, electrical cords, etc? {YES/NO:21197} Adequate lighting in your home to reduce risk of falls? {YES/NO:21197}  ASSISTIVE DEVICES UTILIZED TO PREVENT FALLS:  Life alert? {YES/NO:21197} Use of a cane, walker or w/c? {YES/NO:21197} Grab bars in the bathroom? {YES/NO:21197} Shower chair or bench in shower? {YES/NO:21197} Elevated toilet seat or a handicapped toilet? {YES/NO:21197}  TIMED UP AND GO:  Was the test performed? {YES/NO:21197}.  Length of time to ambulate 10 feet: *** sec.   {Appearance of Gait:2101803}  Cognitive Function:    12/18/2020   11:01 AM  MMSE - Mini Mental State Exam  Orientation to time 5  Orientation to Place 5  Registration 3  Attention/ Calculation 3  Recall 2  Language- name 2 objects 2  Language- repeat 1  Language- follow 3 step command 3  Language- read & follow direction 1  Write a sentence 1  Copy design 1  Total score 27        Immunizations Immunization History  Administered Date(s) Administered   Fluad Quad(high Dose 65+) 12/18/2020   Influenza, Quadrivalent, Recombinant, Inj, Pf 01/03/2022   Influenza,inj,Quad PF,6+ Mos 03/03/2016, 03/16/2019   PFIZER Comirnaty(Gray Top)Covid-19 Tri-Sucrose Vaccine 02/16/2020, 10/31/2020, 05/17/2021   PFIZER(Purple Top)SARS-COV-2 Vaccination 06/03/2019, 07/01/2019   PNEUMOCOCCAL CONJUGATE-20 12/18/2020   Pfizer Covid-19 Vaccine Bivalent Booster 68yrs & up 02/10/2022   Td 06/30/2006   Tdap 04/01/2017   Zoster Recombinat (Shingrix) 05/27/2021, 09/03/2021    {TDAP status:2101805}  {Flu Vaccine  status:2101806}  {Pneumococcal vaccine status:2101807}  {Covid-19 vaccine status:2101808}  Qualifies for Shingles Vaccine? {YES/NO:21197}  Zostavax completed {YES/NO:21197}  {Shingrix Completed?:2101804}  Screening Tests Health Maintenance  Topic Date Due   COVID-19 Vaccine (7 - 2023-24 season) 04/07/2022   INFLUENZA VACCINE  10/30/2022   Medicare Annual Wellness (AWV)  09/09/2023   MAMMOGRAM  06/22/2024   DTaP/Tdap/Td (3 - Td or Tdap) 04/02/2027   Colonoscopy  06/30/2027   Pneumonia Vaccine 63+ Years old  Completed   DEXA SCAN  Completed   Hepatitis C Screening  Completed   Zoster Vaccines- Shingrix  Completed   HPV VACCINES  Aged Out    Health Maintenance  Health Maintenance Due  Topic Date Due   COVID-19 Vaccine (7 - 2023-24 season) 04/07/2022    {Colorectal cancer screening:2101809}  {Mammogram status:21018020}  {Bone Density status:21018021}  Lung Cancer Screening: (Low Dose CT Chest recommended if Age 66-80 years, 30 pack-year currently smoking OR have quit w/in 15years.) {DOES NOT does:27190::"does not"} qualify.   Lung Cancer Screening Referral: ***  Additional Screening:  Hepatitis C Screening: {DOES NOT does:27190::"does not"} qualify; Completed ***  Vision Screening: Recommended annual ophthalmology exams for early detection of glaucoma and other disorders of the eye. Is the patient up to date with their annual eye exam?  {YES/NO:21197} Who is the provider or what is the name of the office in which the patient attends annual eye exams? *** If pt is not established with a provider, would they like to be referred to a provider to establish care? {YES/NO:21197}.   Dental Screening: Recommended annual dental exams for proper oral hygiene  Community Resource Referral / Chronic Care Management: CRR required this visit?  {YES/NO:21197}  CCM required this visit?  {YES/NO:21197}     Plan:     I have personally reviewed and noted the following in the  patient's chart:  Medical and social history Use of alcohol, tobacco or illicit drugs  Current medications and supplements including opioid prescriptions. {Opioid [phone]} Functional ability and status Nutritional status Physical activity Advanced directives List of other physicians Hospitalizations, surgeries, and ER visits in previous 12 months Vitals Screenings to include cognitive, depression, and falls Referrals and appointments  In addition, I have reviewed and discussed with patient certain preventive protocols, quality metrics, and best practice recommendations. A written personalized care plan for preventive services as well as general preventive health recommendations were provided to patient.     Gilmore Laroche, FNP   09/08/2022   Nurse Notes: ***

## 2022-09-09 ENCOUNTER — Ambulatory Visit (INDEPENDENT_AMBULATORY_CARE_PROVIDER_SITE_OTHER): Payer: Medicare HMO | Admitting: Family Medicine

## 2022-09-09 ENCOUNTER — Encounter: Payer: Self-pay | Admitting: Family Medicine

## 2022-09-09 VITALS — BP 110/69 | HR 92 | Ht 61.5 in | Wt 158.0 lb

## 2022-09-09 DIAGNOSIS — Z0000 Encounter for general adult medical examination without abnormal findings: Secondary | ICD-10-CM | POA: Diagnosis not present

## 2022-09-09 NOTE — Patient Instructions (Addendum)
I appreciate the opportunity to provide care to you today!    Follow up:  1 year for annual wellness visit     Please continue to a heart-healthy diet and increase your physical activities. Try to exercise for at least five days a week.      It was a pleasure to see you and I look forward to continuing to work together on your health and well-being. Please do not hesitate to call the office if you need care or have questions about your care.   Have a wonderful day and week. With Gratitude, Gilmore Laroche MSN, FNP-BC

## 2022-09-20 ENCOUNTER — Other Ambulatory Visit: Payer: Self-pay | Admitting: Internal Medicine

## 2022-09-20 DIAGNOSIS — K2100 Gastro-esophageal reflux disease with esophagitis, without bleeding: Secondary | ICD-10-CM

## 2022-09-29 ENCOUNTER — Other Ambulatory Visit: Payer: Self-pay | Admitting: Family Medicine

## 2022-09-29 DIAGNOSIS — G4762 Sleep related leg cramps: Secondary | ICD-10-CM

## 2022-10-24 ENCOUNTER — Other Ambulatory Visit: Payer: Self-pay | Admitting: Internal Medicine

## 2022-10-24 ENCOUNTER — Other Ambulatory Visit: Payer: Self-pay

## 2022-10-24 DIAGNOSIS — K2100 Gastro-esophageal reflux disease with esophagitis, without bleeding: Secondary | ICD-10-CM

## 2022-10-24 MED ORDER — OMEPRAZOLE 40 MG PO CPDR
40.0000 mg | DELAYED_RELEASE_CAPSULE | Freq: Every day | ORAL | 0 refills | Status: DC
Start: 2022-10-24 — End: 2022-12-04

## 2022-11-11 ENCOUNTER — Other Ambulatory Visit: Payer: Self-pay

## 2022-11-11 DIAGNOSIS — G4762 Sleep related leg cramps: Secondary | ICD-10-CM

## 2022-11-11 MED ORDER — DILTIAZEM HCL 30 MG PO TABS
30.0000 mg | ORAL_TABLET | Freq: Every day | ORAL | 3 refills | Status: DC
Start: 2022-11-11 — End: 2022-12-09

## 2022-12-04 ENCOUNTER — Other Ambulatory Visit: Payer: Self-pay | Admitting: Internal Medicine

## 2022-12-04 DIAGNOSIS — K2100 Gastro-esophageal reflux disease with esophagitis, without bleeding: Secondary | ICD-10-CM

## 2022-12-04 MED ORDER — OMEPRAZOLE 40 MG PO CPDR: 40.0000 mg | DELAYED_RELEASE_CAPSULE | Freq: Every day | ORAL | 3 refills | Status: AC

## 2022-12-09 ENCOUNTER — Ambulatory Visit (INDEPENDENT_AMBULATORY_CARE_PROVIDER_SITE_OTHER): Payer: Medicare HMO | Admitting: Family Medicine

## 2022-12-09 ENCOUNTER — Encounter: Payer: Self-pay | Admitting: Family Medicine

## 2022-12-09 VITALS — BP 130/73 | HR 75 | Ht 61.5 in | Wt 158.1 lb

## 2022-12-09 DIAGNOSIS — M62838 Other muscle spasm: Secondary | ICD-10-CM | POA: Diagnosis not present

## 2022-12-09 DIAGNOSIS — Z23 Encounter for immunization: Secondary | ICD-10-CM

## 2022-12-09 DIAGNOSIS — E039 Hypothyroidism, unspecified: Secondary | ICD-10-CM

## 2022-12-09 DIAGNOSIS — E038 Other specified hypothyroidism: Secondary | ICD-10-CM

## 2022-12-09 DIAGNOSIS — E559 Vitamin D deficiency, unspecified: Secondary | ICD-10-CM | POA: Diagnosis not present

## 2022-12-09 DIAGNOSIS — I781 Nevus, non-neoplastic: Secondary | ICD-10-CM

## 2022-12-09 DIAGNOSIS — R7301 Impaired fasting glucose: Secondary | ICD-10-CM | POA: Diagnosis not present

## 2022-12-09 DIAGNOSIS — E782 Mixed hyperlipidemia: Secondary | ICD-10-CM

## 2022-12-09 DIAGNOSIS — G4762 Sleep related leg cramps: Secondary | ICD-10-CM

## 2022-12-09 DIAGNOSIS — E7849 Other hyperlipidemia: Secondary | ICD-10-CM

## 2022-12-09 DIAGNOSIS — M8589 Other specified disorders of bone density and structure, multiple sites: Secondary | ICD-10-CM

## 2022-12-09 MED ORDER — ROSUVASTATIN CALCIUM 10 MG PO TABS
10.0000 mg | ORAL_TABLET | Freq: Every day | ORAL | 3 refills | Status: DC
Start: 2022-12-09 — End: 2023-09-23

## 2022-12-09 MED ORDER — CALCIUM CARB-CHOLECALCIFEROL 600-20 MG-MCG PO TABS
600.0000 mg | ORAL_TABLET | Freq: Two times a day (BID) | ORAL | 6 refills | Status: AC
Start: 2022-12-09 — End: ?

## 2022-12-09 MED ORDER — LEVOTHYROXINE SODIUM 75 MCG PO TABS
75.0000 ug | ORAL_TABLET | Freq: Every day | ORAL | 1 refills | Status: DC
Start: 2022-12-09 — End: 2023-01-15

## 2022-12-09 MED ORDER — CYCLOBENZAPRINE HCL 5 MG PO TABS: 5.0000 mg | ORAL_TABLET | Freq: Three times a day (TID) | ORAL | 1 refills | Status: DC | PRN

## 2022-12-09 MED ORDER — DILTIAZEM HCL 30 MG PO TABS
30.0000 mg | ORAL_TABLET | Freq: Every day | ORAL | 3 refills | Status: DC
Start: 2022-12-09 — End: 2023-03-30

## 2022-12-09 MED ORDER — VITAMIN D (ERGOCALCIFEROL) 1.25 MG (50000 UNIT) PO CAPS: 50000.0000 [IU] | ORAL_CAPSULE | ORAL | 1 refills | Status: DC

## 2022-12-09 NOTE — Assessment & Plan Note (Signed)
The patient was advised to elevate the legs for 20-30 minutes daily, above the level of the heart. Additionally, daily use of compression stockings, regular exercise, weight reduction, and avoiding prolonged sitting or standing were recommended. A referral to vascular surgery has been placed for further evaluation and management.

## 2022-12-09 NOTE — Assessment & Plan Note (Signed)
Encouraged to continue treatment regimen Pending thyroid panel Lab Results  Component Value Date   TSH 0.702 07/23/2022

## 2022-12-09 NOTE — Assessment & Plan Note (Signed)
She takes rosuvastatin 10 mg daily Encouraged a heart healthy diet with increased physical activity Pending lipid panel Lab Results  Component Value Date   CHOL 180 07/23/2022   HDL 50 07/23/2022   LDLCALC 82 07/23/2022   LDLDIRECT 108.0 12/18/2020   TRIG 297 (H) 07/23/2022   CHOLHDL 3.6 07/23/2022

## 2022-12-09 NOTE — Assessment & Plan Note (Signed)
The patient was educated about the common side effects of diltiazem, which may include peripheral edema, dizziness, headaches, fatigue, flushing, nausea, constipation, and bradycardia. The patient was encouraged to continue taking diltiazem 30 mg at bedtime to help manage nocturnal leg cramps.

## 2022-12-09 NOTE — Progress Notes (Signed)
Established Patient Office Visit  Subjective:  Patient ID: Sheri Rice, female    DOB: 01/18/1956  Age: 67 y.o. MRN: [id number]  CC:  Chief Complaint  Patient presents with   Care Management    Follow up appt. Needs refills.    Leg Pain    Pt reports ongoing leg pain worsens at night. Has concerns with diltiazem, veins seem to be popped out more.     HPI Sheri Rice is a 67 y.o. female with past medical history of hypothyroidism, hyperlipidemia, GERD, nocturnal leg cramps presents for f/u of  chronic medical conditions.  Telangiectasia: The patient has been experiencing telangiectasia for 12 years. She reports worsening symptoms with increased pressure from prolonged standing and associated pain. Physical examination reveals no swelling. The patient has not been using compression stockings due to discomfort and pressure in her legs.  Nocturnal Leg Cramps: The patient reports a decrease in symptoms since starting potassium 30 mg nightly.  Hypothyroidism: The patient is on Synthroid 75 mcg daily. She has no complaints or concerns regarding her thyroid condition and is requesting refills today.  Hyperlipidemia: The patient is taking rosuvastatin 10 mg daily and reports adherence to a heart-healthy diet.  Gastroesophageal Reflux Disease (GERD): The patient's condition remains stable on omeprazole 40 mg daily.  Past Medical History:  Diagnosis Date   GERD (gastroesophageal reflux disease)    Hyperlipidemia    Hypothyroidism     Past Surgical History:  Procedure Laterality Date   BALLOON DILATION N/A 11/26/2021   Procedure: BALLOON DILATION;  Surgeon: Lanelle Bal, DO;  [facility];  Service: Endoscopy;  Laterality: N/A;   BIOPSY  11/26/2021   Procedure: BIOPSY;  Surgeon: Lanelle Bal, DO;  [facility];  Service: Endoscopy;;   CHONDROPLASTY Left 07/23/2021   Procedure: CHONDROPLASTY;  Surgeon: Vickki Hearing, MD;  [facility];   Service: Orthopedics;  Laterality: Left;   COLONOSCOPY  06/2017   Dr. Yancey Flemings;  Small internal hemorrhoids, otherwise normal exam. Repeat in 10 years.   DILATION AND CURETTAGE OF UTERUS     ESOPHAGOGASTRODUODENOSCOPY  06/2017   Dr. Yancey Flemings; Normal exam   ESOPHAGOGASTRODUODENOSCOPY (EGD) WITH PROPOFOL N/A 11/26/2021   Procedure: ESOPHAGOGASTRODUODENOSCOPY (EGD) WITH PROPOFOL;  Surgeon: Lanelle Bal, DO;  [facility];  Service: Endoscopy;  Laterality: N/A;  10:30 am   KNEE ARTHROSCOPY WITH MEDIAL MENISECTOMY Left 07/23/2021   Procedure: KNEE ARTHROSCOPY WITH MEDIAL MENISECTOMY;  Surgeon: Vickki Hearing, MD;  Location: [facility];  Service: Orthopedics;  Laterality: Left;    Family History  Problem Relation Age of Onset   Diabetes Mother    Depression Mother    Diabetes type II Sister    Hypertension Sister    Stroke Maternal Grandmother    Colon cancer Neg Hx    Liver cancer Neg Hx    Breast cancer Neg Hx    Cervical cancer Neg Hx    Stomach cancer Neg Hx    Gastric cancer Neg Hx     Social History   Socioeconomic History   Marital status: Married    Spouse name: Not on file   Number of children: 3   Years of education: 11   Highest education level: Not on file  Occupational History   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   Tobacco Use   Smoking status: Never   Smokeless tobacco: Never  Vaping Use   Vaping status: Never Used  Substance and Sexual Activity  Alcohol use: No    Alcohol/week: 0.0 standard drinks of alcohol   Drug use: Never   Sexual activity: Yes    Birth control/protection: Post-menopausal  Other Topics Concern   Not on file  Social History Narrative   Pt is retired, Married , lives with her husband    Social Determinants of Health   Financial Resource Strain: Low Risk  (09/09/2022)   Overall Financial Resource Strain (CARDIA)    Difficulty of Paying Living Expenses: Not hard at all  Food Insecurity: No Food Insecurity (09/09/2022)    Hunger Vital Sign    Worried About Running Out of Food in the Last Year: Never true    Ran Out of Food in the Last Year: Never true  Transportation Needs: No Transportation Needs (09/09/2022)   PRAPARE - Administrator, Civil Service (Medical): No    Lack of Transportation (Non-Medical): No  Physical Activity: Sufficiently Active (09/09/2022)   Exercise Vital Sign    Days of Exercise per Week: 5 days    Minutes of Exercise per Session: 60 min  Stress: No Stress Concern Present (09/09/2022)   [employer] of Occupational Health - Occupational Stress Questionnaire    Feeling of Stress : Not at all  Social Connections: Socially Integrated (09/09/2022)   Social Connection and Isolation Panel [NHANES]    Frequency of Communication with Friends and Family: More than three times a week    Frequency of Social Gatherings with Friends and Family: More than three times a week    Attends Religious Services: More than 4 times per year    Active Member of [employer] or Organizations: Yes    Attends Engineer, structural: More than 4 times per year    Marital Status: Married  Catering manager Violence: Not At Risk (09/09/2022)   Humiliation, Afraid, Rape, and Kick questionnaire    Fear of Current or Ex-Partner: No    Emotionally Abused: No    Physically Abused: No    Sexually Abused: No    Outpatient Medications Prior to Visit  Medication Sig Dispense Refill   acetaminophen (TYLENOL) 500 MG tablet Take 2 tablets (1,000 mg total) by mouth every 8 (eight) hours as needed.     cholecalciferol (VITAMIN D3) 25 MCG (1000 UNIT) tablet Take 1,000 Units by mouth daily.     MAGNESIUM GLUCONATE PO Take 400 mg by mouth daily.     omeprazole (PRILOSEC) 40 MG capsule Take 1 capsule (40 mg total) by mouth daily. 90 capsule 3   Potassium 99 MG TABS Take 99 mg by mouth daily.     prednisoLONE acetate (PRED FORTE) 1 % ophthalmic suspension Place 1 drop into both eyes daily.     Vitamin A 2400 MCG  (8000 UT) CAPS Take 800 Units by mouth daily.     Calcium Carb-Cholecalciferol (CALTRATE 600+D3) 600-20 MG-MCG TABS Take 600 mg by mouth 2 (two) times daily. 60 tablet 6   cyclobenzaprine (FLEXERIL) 5 MG tablet Take 1 tablet (5 mg total) by mouth 3 (three) times daily as needed for muscle spasms. 30 tablet 1   diltiazem (CARDIZEM) 30 MG tablet Take 1 tablet (30 mg total) by mouth at bedtime. 30 tablet 3   levothyroxine (SYNTHROID) 75 MCG tablet Take 1 tablet (75 mcg total) by mouth daily. 90 tablet 1   rosuvastatin (CRESTOR) 10 MG tablet Take 1 tablet (10 mg total) by mouth daily. 90 tablet 3   Vitamin D, Ergocalciferol, (DRISDOL) 1.25 MG (50000 UNIT)  CAPS capsule Take 1 capsule (50,000 Units total) by mouth every 7 (seven) days. 10 capsule 1   vitamin E 180 MG (400 UNITS) capsule Take 400 Units by mouth daily.     Zinc 50 MG CAPS Take 50 mg by mouth daily.     Facility-Administered Medications Prior to Visit  Medication Dose Route Frequency Provider Last Rate Last Admin   0.9 %  sodium chloride infusion  500 mL Intravenous Once Hilarie Fredrickson, MD        No Known Allergies  ROS Review of Systems  Constitutional:  Negative for chills and fever.  Eyes:  Negative for visual disturbance.  Respiratory:  Negative for chest tightness and shortness of breath.   Cardiovascular:  Negative for leg swelling.  Neurological:  Negative for dizziness and headaches.      Objective:    Physical Exam HENT:     Head: Normocephalic.     Mouth/Throat:     Mouth: Mucous membranes are moist.  Cardiovascular:     Rate and Rhythm: Normal rate.     Heart sounds: Normal heart sounds.  Pulmonary:     Effort: Pulmonary effort is normal.     Breath sounds: Normal breath sounds.  Skin:    Comments: Telangiectasia of lower extremities  Neurological:     Mental Status: She is alert.     BP 130/73   Pulse 75   Ht 5' 1.5" (1.562 m)   Wt 158 lb 1.9 oz (71.7 kg)   SpO2 98%   BMI 29.39 kg/m  Wt  Readings from Last 3 Encounters:  12/09/22 158 lb 1.9 oz (71.7 kg)  09/09/22 158 lb (71.7 kg)  08/26/22 158 lb 1.9 oz (71.7 kg)    Lab Results  Component Value Date   TSH 0.702 07/23/2022   Lab Results  Component Value Date   WBC 6.1 07/23/2022   HGB 12.6 07/23/2022   HCT 37.9 07/23/2022   MCV 92 07/23/2022   PLT 277 07/23/2022   Lab Results  Component Value Date   NA 141 07/23/2022   K 4.8 07/23/2022   CO2 25 07/23/2022   GLUCOSE 93 07/23/2022   BUN 11 07/23/2022   CREATININE 0.70 07/23/2022   BILITOT 0.3 07/23/2022   ALKPHOS 102 07/23/2022   AST 14 07/23/2022   ALT 14 07/23/2022   PROT 6.9 07/23/2022   ALBUMIN 4.4 07/23/2022   CALCIUM 9.8 07/23/2022   EGFR 95 07/23/2022   GFR 87.88 12/18/2020   Lab Results  Component Value Date   CHOL 180 07/23/2022   Lab Results  Component Value Date   HDL 50 07/23/2022   Lab Results  Component Value Date   LDLCALC 82 07/23/2022   Lab Results  Component Value Date   TRIG 297 (H) 07/23/2022   Lab Results  Component Value Date   CHOLHDL 3.6 07/23/2022   Lab Results  Component Value Date   HGBA1C 5.2 07/23/2022      Assessment & Plan:  Spider telangiectasia Assessment & Plan: The patient was advised to elevate the legs for 20-30 minutes daily, above the level of the heart. Additionally, daily use of compression stockings, regular exercise, weight reduction, and avoiding prolonged sitting or standing were recommended. A referral to vascular surgery has been placed for further evaluation and management.   Orders: -     Ambulatory referral to Vascular Surgery  Nocturnal leg cramps Assessment & Plan: The patient was educated about the common side effects of diltiazem,  which may include peripheral edema, dizziness, headaches, fatigue, flushing, nausea, constipation, and bradycardia. The patient was encouraged to continue taking diltiazem 30 mg at bedtime to help manage nocturnal leg cramps.  Orders: -      dilTIAZem HCl; Take 1 tablet (30 mg total) by mouth at bedtime.  Dispense: 30 tablet; Refill: 3  Other specified hypothyroidism Assessment & Plan: Encouraged to continue treatment regimen Pending thyroid panel Lab Results  Component Value Date   TSH 0.702 07/23/2022     Orders: -     TSH + free T4  Hypothyroidism, unspecified type Assessment & Plan: Encouraged to continue treatment regimen Pending thyroid panel Lab Results  Component Value Date   TSH 0.702 07/23/2022     Orders: -     Levothyroxine Sodium; Take 1 tablet (75 mcg total) by mouth daily.  Dispense: 90 tablet; Refill: 1  Mixed hyperlipidemia Assessment & Plan: She takes rosuvastatin 10 mg daily Encouraged a heart healthy diet with increased physical activity Pending lipid panel Lab Results  Component Value Date   CHOL 180 07/23/2022   HDL 50 07/23/2022   LDLCALC 82 07/23/2022   LDLDIRECT 108.0 12/18/2020   TRIG 297 (H) 07/23/2022   CHOLHDL 3.6 07/23/2022     Orders: -     Rosuvastatin Calcium; Take 1 tablet (10 mg total) by mouth daily.  Dispense: 90 tablet; Refill: 3  Vitamin D deficiency -     Vitamin D (Ergocalciferol); Take 1 capsule (50,000 Units total) by mouth every 7 (seven) days.  Dispense: 10 capsule; Refill: 1 -     VITAMIN D 25 Hydroxy (Vit-D Deficiency, Fractures)  IFG (impaired fasting glucose) -     Hemoglobin A1c  Other hyperlipidemia -     Lipid panel -     CMP14+EGFR -     CBC with Differential/Platelet  Encounter for immunization -     Flu Vaccine Trivalent High Dose (Fluad)  Muscle spasm -     Cyclobenzaprine HCl; Take 1 tablet (5 mg total) by mouth 3 (three) times daily as needed for muscle spasms.  Dispense: 30 tablet; Refill: 1  Osteopenia of multiple sites -     Calcium Carb-Cholecalciferol; Take 600 mg by mouth 2 (two) times daily.  Dispense: 60 tablet; Refill: 6  Note: This chart has been completed using Engineer, civil (consulting) software, and while attempts have  been made to ensure accuracy, certain words and phrases may not be transcribed as intended.    Follow-up: Return in about 4 months (around 04/10/2023).   Gilmore Laroche, FNP

## 2022-12-09 NOTE — Patient Instructions (Addendum)
  Agradezco la oportunidad de brindarle atencin hoy!  Seguimiento: 4 meses  Laboratorios: Por favor, pase por el laboratorio hoy para realizarse un anlisis de sangre (CBC, CMP, TSH, Perfil Lipdico, HgA1c, Vit D).  Intervenciones no farmacolgicas para el manejo de las venas en araa (telangiectasias) se centran en modificaciones en el estilo de vida y diversas medidas de apoyo. Aqu hay varias estrategias:  Terapia de Compresin:  Medias de Compresin: Usar medias de compresin graduadas puede ayudar a mejorar la circulacin y reducir la apariencia de las venas en araa. Estas medias aplican una presin [employer], lo que puede evitar que la sangre se acumule y [employer] sntomas. Modificaciones en el Estilo de Vida:  Ejercicio Regular: Realice actividad fsica regular, como caminar o nadar, para promover una circulacin sangunea saludable y reducir la presin venosa. Manejo del Peso: Mantenga un peso saludable para reducir la presin sobre sus venas y minimizar el riesgo de desarrollar nuevas venas en araa. Advertising account planner de Pie o Sentado Prolongadamente: Tome descansos frecuentes para moverse si debe estar de pie o sentado durante perodos prolongados. Esto ayuda a Management consultant de [employer]. Eleve las Piernas: Eleve las piernas por encima del nivel del corazn varias veces al da para ayudar a reducir la presin venosa y mejorar el flujo sanguneo. Evite Ropa Ajustada:  Ropa Suelta: Use ropa suelta para evitar restringir el flujo sanguneo en las piernas. La ropa ajustada puede aumentar la presin venosa y contribuir al desarrollo de venas en araa. Proteja la Piel:  Proteccin Solar: Personnel officer en sus piernas para prevenir daos en la piel, lo que puede hacer que las venas en araa sean ms visibles. Dieta Saludable: Consuma una dieta rica en fibra y baja en sal para apoyar la salud vascular en general. La fibra ayuda a prevenir el  estreimiento, lo que puede contribuir a aumentar la presin venosa. Cuidado de las Piernas: Museum/gallery curator en las piernas pueden mejorar la circulacin y ayudar a Acupuncturist asociado con las venas en araa. Cuidado de los Pies: Cuide regularmente sus pies y piernas, incluyendo una hidratacin [place] y la aplicacin de cremas para Pharmacologist la salud de la piel. Evite la Exposicin al Calor: Limite los baos calientes y las saunas: Evite la exposicin prolongada al Airline pilot, ya que esto puede exacerbar la apariencia de las venas en araa. Referencias para hoy - Ciruga Vascular  Por favor, contine con una dieta saludable para el corazn y aumente sus actividades fsicas. Intente hacer ejercicio durante al menos 30 minutos, al [employer] a la [place].  Fue Psychiatrist verlo/a y Radio producer trabajando juntos en su salud y [place]. No dude en llamar a la oficina si necesita atencin o tiene preguntas sobre su cuidado.  En caso de emergencia, visite el Departamento de Emergencias para atencin [place], o comunquese con Lawana Chambers al [phone] para programar una cita. Estamos aqu para ayudarle!  Que tenga un Camera operator y [place].  Con gratitud,  Gilmore Laroche MSN, FNP-BC

## 2022-12-10 LAB — CMP14+EGFR
ALT: 16 IU/L (ref 0–32)
AST: 16 IU/L (ref 0–40)
Albumin: 4.3 g/dL (ref 3.9–4.9)
Alkaline Phosphatase: 93 IU/L (ref 44–121)
BUN/Creatinine Ratio: 14 (ref 12–28)
BUN: 11 mg/dL (ref 8–27)
Bilirubin Total: 0.2 mg/dL (ref 0.0–1.2)
CO2: 25 mmol/L (ref 20–29)
Calcium: 9.5 mg/dL (ref 8.7–10.3)
Chloride: 104 mmol/L (ref 96–106)
Creatinine, Ser: 0.77 mg/dL (ref 0.57–1.00)
Globulin, Total: 2.4 g/dL (ref 1.5–4.5)
Glucose: 99 mg/dL (ref 70–99)
Potassium: 4.2 mmol/L (ref 3.5–5.2)
Sodium: 142 mmol/L (ref 134–144)
Total Protein: 6.7 g/dL (ref 6.0–8.5)
eGFR: 84 mL/min/{1.73_m2} (ref >59)

## 2022-12-10 LAB — CBC WITH DIFFERENTIAL/PLATELET
Basophils Absolute: 0 10*3/uL (ref 0.0–0.2)
Basos: 1 %
EOS (ABSOLUTE): 0.1 10*3/uL (ref 0.0–0.4)
Eos: 1 %
Hematocrit: 37.1 % (ref 34.0–46.6)
Hemoglobin: 12.4 g/dL (ref 11.1–15.9)
Immature Grans (Abs): 0 10*3/uL (ref 0.0–0.1)
Immature Granulocytes: 0 %
Lymphocytes Absolute: 2.1 10*3/uL (ref 0.7–3.1)
Lymphs: 31 %
MCH: 30.9 pg (ref 26.6–33.0)
MCHC: 33.4 g/dL (ref 31.5–35.7)
MCV: 93 fL (ref 79–97)
Monocytes Absolute: 0.5 10*3/uL (ref 0.1–0.9)
Monocytes: 8 %
Neutrophils Absolute: 3.9 10*3/uL (ref 1.4–7.0)
Neutrophils: 59 %
Platelets: 278 10*3/uL (ref 150–450)
RBC: 4.01 x10E6/uL (ref 3.77–5.28)
RDW: 12.5 % (ref 11.7–15.4)
WBC: 6.6 10*3/uL (ref 3.4–10.8)

## 2022-12-10 LAB — HEMOGLOBIN A1C
Est. average glucose Bld gHb Est-mCnc: 120 mg/dL
Hgb A1c MFr Bld: 5.8 % — ABNORMAL HIGH (ref 4.8–5.6)

## 2022-12-10 LAB — LIPID PANEL
Chol/HDL Ratio: 3.1 ratio (ref 0.0–4.4)
Cholesterol, Total: 173 mg/dL (ref 100–199)
HDL: 55 mg/dL (ref >39)
LDL Chol Calc (NIH): 88 mg/dL (ref 0–99)
Triglycerides: 179 mg/dL — ABNORMAL HIGH (ref 0–149)
VLDL Cholesterol Cal: 30 mg/dL (ref 5–40)

## 2022-12-10 LAB — VITAMIN D 25 HYDROXY (VIT D DEFICIENCY, FRACTURES): Vit D, 25-Hydroxy: 48 ng/mL (ref 30.0–100.0)

## 2022-12-10 LAB — TSH+FREE T4
Free T4: 1.49 ng/dL (ref 0.82–1.77)
TSH: 0.7 u[IU]/mL (ref 0.450–4.500)

## 2022-12-12 NOTE — Progress Notes (Signed)
Please inform the patient that her laboratory results indicate she is prediabetic. I recommend reducing her intake of high-sugar foods and beverages and increasing her physical activity. Additionally, she should continue to decrease her consumption of greasy, fatty, and starchy foods while maintaining an increase in physical activity.

## 2023-01-05 ENCOUNTER — Encounter: Payer: Self-pay | Admitting: Family Medicine

## 2023-01-05 ENCOUNTER — Ambulatory Visit: Payer: Medicare HMO | Admitting: Family Medicine

## 2023-01-05 VITALS — BP 118/78 | HR 77 | Ht 62.0 in | Wt 160.0 lb

## 2023-01-05 DIAGNOSIS — R21 Rash and other nonspecific skin eruption: Secondary | ICD-10-CM | POA: Diagnosis not present

## 2023-01-05 DIAGNOSIS — R52 Pain, unspecified: Secondary | ICD-10-CM

## 2023-01-05 DIAGNOSIS — M7918 Myalgia, other site: Secondary | ICD-10-CM

## 2023-01-05 DIAGNOSIS — T7840XA Allergy, unspecified, initial encounter: Secondary | ICD-10-CM | POA: Diagnosis not present

## 2023-01-05 DIAGNOSIS — R220 Localized swelling, mass and lump, head: Secondary | ICD-10-CM

## 2023-01-05 MED ORDER — EPINEPHRINE 0.3 MG/0.3ML IJ SOAJ: 0.3000 mg | INTRAMUSCULAR | 2 refills | Status: DC | PRN

## 2023-01-05 MED ORDER — DULOXETINE HCL 30 MG PO CPEP: 30.0000 mg | ORAL_CAPSULE | Freq: Every day | ORAL | 3 refills | Status: DC

## 2023-01-05 MED ORDER — LEVOCETIRIZINE DIHYDROCHLORIDE 5 MG PO TABS
5.0000 mg | ORAL_TABLET | Freq: Every evening | ORAL | 1 refills | Status: DC
Start: 2023-01-05 — End: 2023-08-26

## 2023-01-05 NOTE — Progress Notes (Signed)
Patient Office Visit   Subjective   Patient ID: Sheri Rice, female    DOB: April 07, 1955  Age: 67 y.o. MRN: [id number]  CC:  Chief Complaint  Patient presents with   Facial Swelling    Patient complains of face itching and swelling for 2 weeks.    Leg Pain    Patient complains of legs hurting for a long time but worsening.     HPI Sheri Rice 67 year old female, presents to the clinic for facial swelling and itching 2 weeks ago now resolved and chronic leg pain She  has a past medical history of GERD (gastroesophageal reflux disease), Hyperlipidemia, and Hypothyroidism.  Facial swelling  This is a new problem. The problem has been gradually improving since onset. The affected locations include the face. The rash is characterized by itchiness, redness and swelling. It is unknown if there was an exposure to a precipitant. Pertinent negatives include no diarrhea, eye pain, rhinorrhea or shortness of breath. Past treatments include moisturizer. The treatment provided no relief. There is no history of allergies or eczema.    Leg Pain  There was no injury mechanism. The pain is present in the left leg and right leg. The quality of the pain is described as aching. The pain is at a severity of 8/10. The pain has been Intermittent since onset. Associated symptoms include muscle weakness. She reports no foreign bodies present. The symptoms are aggravated by movement, palpation and weight bearing. She has tried acetaminophen and rest for the symptoms. The treatment provided no relief.      Outpatient Encounter Medications as of 01/05/2023  Medication Sig   acetaminophen (TYLENOL) 500 MG tablet Take 2 tablets (1,000 mg total) by mouth every 8 (eight) hours as needed.   BOOSTRIX 5-2.5-18.5 LF-MCG/0.5 injection    Calcium Carb-Cholecalciferol (CALTRATE 600+D3) 600-20 MG-MCG TABS Take 600 mg by mouth 2 (two) times daily.   cholecalciferol (VITAMIN D3) 25 MCG (1000 UNIT) tablet Take 1,000  Units by mouth daily.   cyclobenzaprine (FLEXERIL) 5 MG tablet Take 1 tablet (5 mg total) by mouth 3 (three) times daily as needed for muscle spasms.   diltiazem (CARDIZEM) 30 MG tablet Take 1 tablet (30 mg total) by mouth at bedtime.   DULoxetine (CYMBALTA) 30 MG capsule Take 1 capsule (30 mg total) by mouth daily.   EPINEPHrine 0.3 mg/0.3 mL IJ SOAJ injection Inject 0.3 mg into the muscle as needed for anaphylaxis.   levocetirizine (XYZAL) 5 MG tablet Take 1 tablet (5 mg total) by mouth every evening.   levothyroxine (SYNTHROID) 75 MCG tablet Take 1 tablet (75 mcg total) by mouth daily.   MAGNESIUM GLUCONATE PO Take 400 mg by mouth daily.   omeprazole (PRILOSEC) 40 MG capsule Take 1 capsule (40 mg total) by mouth daily.   Potassium 99 MG TABS Take 99 mg by mouth daily.   prednisoLONE acetate (PRED FORTE) 1 % ophthalmic suspension Place 1 drop into both eyes daily.   rosuvastatin (CRESTOR) 10 MG tablet Take 1 tablet (10 mg total) by mouth daily.   TWINRIX 720-20 ELU-MCG/ML injection    Vitamin A 2400 MCG (8000 UT) CAPS Take 800 Units by mouth daily.   Vitamin D, Ergocalciferol, (DRISDOL) 1.25 MG (50000 UNIT) CAPS capsule Take 1 capsule (50,000 Units total) by mouth every 7 (seven) days.   Facility-Administered Encounter Medications as of 01/05/2023  Medication   0.9 %  sodium chloride infusion    Past Surgical History:  Procedure Laterality Date  BALLOON DILATION N/A 11/26/2021   Procedure: BALLOON DILATION;  Surgeon: Lanelle Bal, DO;  Location: [facility];  Service: Endoscopy;  Laterality: N/A;   BIOPSY  11/26/2021   Procedure: BIOPSY;  Surgeon: Lanelle Bal, DO;  Location: [facility];  Service: Endoscopy;;   CHONDROPLASTY Left 07/23/2021   Procedure: CHONDROPLASTY;  Surgeon: Vickki Hearing, MD;  Location: [facility];  Service: Orthopedics;  Laterality: Left;   COLONOSCOPY  06/2017   Dr. Yancey Flemings;  Small internal hemorrhoids, otherwise normal exam. Repeat in 10  years.   DILATION AND CURETTAGE OF UTERUS     ESOPHAGOGASTRODUODENOSCOPY  06/2017   Dr. Yancey Flemings; Normal exam   ESOPHAGOGASTRODUODENOSCOPY (EGD) WITH PROPOFOL N/A 11/26/2021   Procedure: ESOPHAGOGASTRODUODENOSCOPY (EGD) WITH PROPOFOL;  Surgeon: Lanelle Bal, DO;  Location: [facility];  Service: Endoscopy;  Laterality: N/A;  10:30 am   KNEE ARTHROSCOPY WITH MEDIAL MENISECTOMY Left 07/23/2021   Procedure: KNEE ARTHROSCOPY WITH MEDIAL MENISECTOMY;  Surgeon: Vickki Hearing, MD;  Location: [facility];  Service: Orthopedics;  Laterality: Left;    Review of Systems  Constitutional:  Negative for chills and fever.  Eyes:  Negative for blurred vision.  Respiratory:  Negative for shortness of breath.   Cardiovascular:  Negative for chest pain and palpitations.  Gastrointestinal:  Negative for abdominal pain.  Genitourinary:  Negative for dysuria.  Musculoskeletal:  Positive for joint pain and myalgias.  Skin:  Positive for itching.  Neurological:  Negative for dizziness and headaches.      Objective    BP 118/78   Pulse 77   Ht 5\' 2"  (1.575 m)   Wt 160 lb (72.6 kg)   SpO2 94%   BMI 29.26 kg/m   Physical Exam Vitals reviewed.  Constitutional:      General: She is not in acute distress.    Appearance: Normal appearance. She is not ill-appearing, toxic-appearing or diaphoretic.  HENT:     Head: Normocephalic.  Eyes:     General:        Right eye: No discharge.        Left eye: No discharge.     Conjunctiva/sclera: Conjunctivae normal.  Cardiovascular:     Pulses: Normal pulses.     Heart sounds: Normal heart sounds.  Pulmonary:     Effort: Pulmonary effort is normal. No respiratory distress.     Breath sounds: Normal breath sounds.  Musculoskeletal:        General: Normal range of motion.     Cervical back: Normal range of motion.  Skin:    General: Skin is warm and dry.     Capillary Refill: Capillary refill takes less than 2 seconds.     Coloration: Skin is not  jaundiced or pale.     Findings: No bruising, erythema, lesion or rash.  Neurological:     General: No focal deficit present.     Mental Status: She is alert and oriented to person, place, and time.     Coordination: Coordination normal.     Gait: Gait normal.  Psychiatric:        Mood and Affect: Mood normal.        Behavior: Behavior normal.       Assessment & Plan:  Rash due to allergy -     EPINEPHrine; Inject 0.3 mg into the muscle as needed for anaphylaxis.  Dispense: 1 each; Refill: 2 -     Levocetirizine Dihydrochloride; Take 1 tablet (5 mg  total) by mouth every evening.  Dispense: 15 tablet; Refill: 1  Diffuse pain -     Rheumatoid factor  Pain in multiple muscles Assessment & Plan: Daily chronic muscle pain with intermittent leg cramps Trial Cymbalta 30 mg daily Labs ordered to rule out any deficiency Explained to patient Non pharmacological interventions include the use of ice or heat, rest, recommend range of motion exercises, gentle stretching. Can take OTC tylenol  Follow up for worsening or persistent symptoms. Patient verbalizes understanding regarding plan of care and all questions answered.    Orders: -     Magnesium -     Iron, TIBC and Ferritin Panel -     B12 and Folate Panel -     BMP8+eGFR -     DULoxetine HCl; Take 1 capsule (30 mg total) by mouth daily.  Dispense: 30 capsule; Refill: 3  Facial swelling Assessment & Plan: Now resolved, Skin WNL, no facial swelling noted or redness. Trial on Xyzal 5 mg for itch relief EPI-Pen ordered  Advise patient to apply cold , wet cloth or ice pack to the skin that itches, use fragrance-free lotions, soaps, and detergents to minimize irritation.      Return if symptoms worsen or fail to improve.   Cruzita Lederer Newman Nip, FNP

## 2023-01-05 NOTE — Assessment & Plan Note (Signed)
Daily chronic muscle pain with intermittent leg cramps Trial Cymbalta 30 mg daily Labs ordered to rule out any deficiency Explained to patient Non pharmacological interventions include the use of ice or heat, rest, recommend range of motion exercises, gentle stretching. Can take OTC tylenol  Follow up for worsening or persistent symptoms. Patient verbalizes understanding regarding plan of care and all questions answered.

## 2023-01-05 NOTE — Patient Instructions (Signed)

## 2023-01-05 NOTE — Assessment & Plan Note (Addendum)
Now resolved, Skin WNL, no facial swelling noted or redness. Trial on Xyzal 5 mg for itch relief EPI-Pen ordered  Advise patient to apply cold , wet cloth or ice pack to the skin that itches, use fragrance-free lotions, soaps, and detergents to minimize irritation.

## 2023-01-06 LAB — IRON,TIBC AND FERRITIN PANEL
Ferritin: 160 ng/mL — ABNORMAL HIGH (ref 15–150)
Iron Saturation: 37 % (ref 15–55)
Iron: 107 ug/dL (ref 27–139)
Total Iron Binding Capacity: 291 ug/dL (ref 250–450)
UIBC: 184 ug/dL (ref 118–369)

## 2023-01-06 LAB — BMP8+EGFR
BUN/Creatinine Ratio: 14 (ref 12–28)
BUN: 12 mg/dL (ref 8–27)
CO2: 24 mmol/L (ref 20–29)
Calcium: 9.1 mg/dL (ref 8.7–10.3)
Chloride: 103 mmol/L (ref 96–106)
Creatinine, Ser: 0.85 mg/dL (ref 0.57–1.00)
Glucose: 92 mg/dL (ref 70–99)
Potassium: 4.7 mmol/L (ref 3.5–5.2)
Sodium: 141 mmol/L (ref 134–144)
eGFR: 75 mL/min/{1.73_m2} (ref >59)

## 2023-01-06 LAB — MAGNESIUM: Magnesium: 1.9 mg/dL (ref 1.6–2.3)

## 2023-01-06 LAB — B12 AND FOLATE PANEL
Folate: 4.5 ng/mL (ref >3.0)
Vitamin B-12: >2000 pg/mL — ABNORMAL HIGH (ref 232–1245)

## 2023-01-06 LAB — RHEUMATOID FACTOR: Rheumatoid fact SerPl-aCnc: 11 [IU]/mL (ref <14.0)

## 2023-01-06 NOTE — Progress Notes (Signed)
Hello Sheri Rice,  Spanish speaking patient,  Please inform patient all labs normal, except Vit b12 is elevated and being over treated  Discontinue  Vit b12 daily supplement and start taking only 3 days a week  Lab negative for Rheumatoid arthritis   Continue to take Cymbalta 30 mg one daily for chronic muscle pain.    For muscle pain, Cymbalta may take 1 to 2 weeks for some relief, but the full benefit can take up to 4 to 6 weeks. It's important to give it time and continue taking the medication daily.

## 2023-01-12 DIAGNOSIS — H1789 Other corneal scars and opacities: Secondary | ICD-10-CM | POA: Diagnosis not present

## 2023-01-12 DIAGNOSIS — H04123 Dry eye syndrome of bilateral lacrimal glands: Secondary | ICD-10-CM | POA: Diagnosis not present

## 2023-01-12 DIAGNOSIS — H26493 Other secondary cataract, bilateral: Secondary | ICD-10-CM | POA: Diagnosis not present

## 2023-01-12 DIAGNOSIS — H5202 Hypermetropia, left eye: Secondary | ICD-10-CM | POA: Diagnosis not present

## 2023-01-12 DIAGNOSIS — H0100B Unspecified blepharitis left eye, upper and lower eyelids: Secondary | ICD-10-CM | POA: Diagnosis not present

## 2023-01-12 DIAGNOSIS — H524 Presbyopia: Secondary | ICD-10-CM | POA: Diagnosis not present

## 2023-01-12 DIAGNOSIS — H0100A Unspecified blepharitis right eye, upper and lower eyelids: Secondary | ICD-10-CM | POA: Diagnosis not present

## 2023-01-15 ENCOUNTER — Other Ambulatory Visit: Payer: Self-pay | Admitting: Family Medicine

## 2023-01-15 DIAGNOSIS — E039 Hypothyroidism, unspecified: Secondary | ICD-10-CM

## 2023-02-06 ENCOUNTER — Other Ambulatory Visit: Payer: Self-pay

## 2023-02-06 DIAGNOSIS — I872 Venous insufficiency (chronic) (peripheral): Secondary | ICD-10-CM

## 2023-02-19 ENCOUNTER — Encounter: Payer: Self-pay | Admitting: Physician Assistant

## 2023-02-19 ENCOUNTER — Ambulatory Visit (HOSPITAL_COMMUNITY)
Admission: RE | Admit: 2023-02-19 | Discharge: 2023-02-19 | Disposition: A | Discharge status: Home / Self Care | Payer: Medicare HMO | Source: Ambulatory Visit | Attending: Vascular Surgery | Admitting: Vascular Surgery

## 2023-02-19 ENCOUNTER — Ambulatory Visit: Payer: Medicare HMO | Admitting: Physician Assistant

## 2023-02-19 VITALS — BP 124/82 | HR 72 | Temp 98.0°F | Wt 159.0 lb

## 2023-02-19 DIAGNOSIS — I872 Venous insufficiency (chronic) (peripheral): Secondary | ICD-10-CM | POA: Diagnosis not present

## 2023-02-19 NOTE — Progress Notes (Signed)
[facility] [place]   Reason for referral: Swollen left > right leg  History of Present Illness  Sheri Rice is a 67 y.o. female who presents with chief complaint: swollen leg.  Patient notes, onset of minimal swelling >12 months ago, associated with left knee injury and right ankle injury.  The patient has had no history of DVT, positive history of varicose vein, no history of venous stasis ulcers, no history of  Lymphedema and minimal history of skin changes in lower legs.  There is unknown family history of venous disorders.  The patient has  used compression stockings in the past.  She does not have swelling much.  She states she has pina in both legs.  The left due to knees swelling and pain the right one due to ankle pain and swelling.  She has multiple spider veins and some varicose veins.  She denies claudication, rest pain and non healing wounds.     Past Medical History:  Diagnosis Date   GERD (gastroesophageal reflux disease)    Hyperlipidemia    Hypothyroidism     Past Surgical History:  Procedure Laterality Date   BALLOON DILATION N/A 11/26/2021   Procedure: BALLOON DILATION;  Surgeon: Lanelle Bal, DO;  [facility];  Service: Endoscopy;  Laterality: N/A;   BIOPSY  11/26/2021   Procedure: BIOPSY;  Surgeon: Lanelle Bal, DO;  Location: [facility];  Service: Endoscopy;;   CHONDROPLASTY Left 07/23/2021   Procedure: CHONDROPLASTY;  Surgeon: Vickki Hearing, MD;  Location: [facility];  Service: Orthopedics;  Laterality: Left;   COLONOSCOPY  06/2017   Dr. Yancey Flemings;  Small internal hemorrhoids, otherwise normal exam. Repeat in 10 years.   DILATION AND CURETTAGE OF UTERUS     ESOPHAGOGASTRODUODENOSCOPY  06/2017   Dr. Yancey Flemings; Normal exam   ESOPHAGOGASTRODUODENOSCOPY (EGD) WITH PROPOFOL N/A 11/26/2021   Procedure: ESOPHAGOGASTRODUODENOSCOPY (EGD) WITH PROPOFOL;  Surgeon: Lanelle Bal, DO;  [facility];   Service: Endoscopy;  Laterality: N/A;  10:30 am   KNEE ARTHROSCOPY WITH MEDIAL MENISECTOMY Left 07/23/2021   Procedure: KNEE ARTHROSCOPY WITH MEDIAL MENISECTOMY;  Surgeon: Vickki Hearing, MD;  Location: [facility];  Service: Orthopedics;  Laterality: Left;    Social History   Socioeconomic History   Marital status: Married    Spouse name: Not on file   Number of children: 3   Years of education: 11   Highest education level: Not on file  Occupational History   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   Tobacco Use   Smoking status: Never   Smokeless tobacco: Never  Vaping Use   Vaping status: Never Used  Substance and Sexual Activity   Alcohol use: No    Alcohol/week: 0.0 standard drinks of alcohol   Drug use: Never   Sexual activity: Yes    Birth control/protection: Post-menopausal  Other Topics Concern   Not on file  Social History Narrative   Pt is retired, Married , lives with her husband    Social Determinants of Health   Financial Resource Strain: Low Risk  (09/09/2022)   Overall Financial Resource Strain (CARDIA)    Difficulty of Paying Living Expenses: Not hard at all  Food Insecurity: No Food Insecurity (09/09/2022)   Hunger Vital Sign    Worried About Running Out of Food in the Last Year: Never true    Ran Out of Food in the Last Year: Never true  Transportation Needs: No Transportation Needs (09/09/2022)  PRAPARE - Administrator, Civil Service (Medical): No    Lack of Transportation (Non-Medical): No  Physical Activity: Sufficiently Active (09/09/2022)   Exercise Vital Sign    Days of Exercise per Week: 5 days    Minutes of Exercise per Session: 60 min  Stress: No Stress Concern Present (09/09/2022)   [employer] of Occupational Health - Occupational Stress Questionnaire    Feeling of Stress : Not at all  Social Connections: Socially Integrated (09/09/2022)   Social Connection and Isolation Panel [NHANES]    Frequency of Communication with Friends and  Family: More than three times a week    Frequency of Social Gatherings with Friends and Family: More than three times a week    Attends Religious Services: More than 4 times per year    Active Member of [employer] or Organizations: Yes    Attends Engineer, structural: More than 4 times per year    Marital Status: Married  Catering manager Violence: Not At Risk (09/09/2022)   Humiliation, Afraid, Rape, and Kick questionnaire    Fear of Current or Ex-Partner: No    Emotionally Abused: No    Physically Abused: No    Sexually Abused: No    Family History  Problem Relation Age of Onset   Diabetes Mother    Depression Mother    Diabetes type II Sister    Hypertension Sister    Stroke Maternal Grandmother    Colon cancer Neg Hx    Liver cancer Neg Hx    Breast cancer Neg Hx    Cervical cancer Neg Hx    Stomach cancer Neg Hx    Gastric cancer Neg Hx     Current Outpatient Medications on File Prior to Visit  Medication Sig Dispense Refill   acetaminophen (TYLENOL) 500 MG tablet Take 2 tablets (1,000 mg total) by mouth every 8 (eight) hours as needed.     BOOSTRIX 5-2.5-18.5 LF-MCG/0.5 injection      Calcium Carb-Cholecalciferol (CALTRATE 600+D3) 600-20 MG-MCG TABS Take 600 mg by mouth 2 (two) times daily. 60 tablet 6   cholecalciferol (VITAMIN D3) 25 MCG (1000 UNIT) tablet Take 1,000 Units by mouth daily.     cyclobenzaprine (FLEXERIL) 5 MG tablet Take 1 tablet (5 mg total) by mouth 3 (three) times daily as needed for muscle spasms. 30 tablet 1   diltiazem (CARDIZEM) 30 MG tablet Take 1 tablet (30 mg total) by mouth at bedtime. 30 tablet 3   DULoxetine (CYMBALTA) 30 MG capsule Take 1 capsule (30 mg total) by mouth daily. 30 capsule 3   EPINEPHrine 0.3 mg/0.3 mL IJ SOAJ injection Inject 0.3 mg into the muscle as needed for anaphylaxis. 1 each 2   levocetirizine (XYZAL) 5 MG tablet Take 1 tablet (5 mg total) by mouth every evening. 15 tablet 1   levothyroxine (SYNTHROID) 75 MCG tablet  TAKE 1 TABLET EVERY DAY 90 tablet 3   MAGNESIUM GLUCONATE PO Take 400 mg by mouth daily.     omeprazole (PRILOSEC) 40 MG capsule Take 1 capsule (40 mg total) by mouth daily. 90 capsule 3   Potassium 99 MG TABS Take 99 mg by mouth daily.     prednisoLONE acetate (PRED FORTE) 1 % ophthalmic suspension Place 1 drop into both eyes daily.     rosuvastatin (CRESTOR) 10 MG tablet Take 1 tablet (10 mg total) by mouth daily. 90 tablet 3   TWINRIX 720-20 ELU-MCG/ML injection      Vitamin A  2400 MCG (8000 UT) CAPS Take 800 Units by mouth daily.     Vitamin D, Ergocalciferol, (DRISDOL) 1.25 MG (50000 UNIT) CAPS capsule Take 1 capsule (50,000 Units total) by mouth every 7 (seven) days. 10 capsule 1   Current Facility-Administered Medications on File Prior to Visit  Medication Dose Route Frequency Provider Last Rate Last Admin   0.9 %  sodium chloride infusion  500 mL Intravenous Once Hilarie Fredrickson, MD        Allergies as of 02/19/2023   (No Known Allergies)     ROS:   General:  No weight loss, Fever, chills  HEENT: No recent headaches, no nasal bleeding, no visual changes, no sore throat  Neurologic: No dizziness, blackouts, seizures. No recent symptoms of stroke or mini- stroke. No recent episodes of slurred speech, or temporary blindness.  Cardiac: No recent episodes of chest pain/pressure, no shortness of breath at rest.  No shortness of breath with exertion.  Denies history of atrial fibrillation or irregular heartbeat  Vascular: No history of rest pain in feet.  No history of claudication.  No history of non-healing ulcer, No history of DVT   Pulmonary: No home oxygen, no productive cough, no hemoptysis,  No asthma or wheezing  Musculoskeletal:  [ ]  Arthritis, [ ]  Low back pain,  [ x] Joint pain  Hematologic:No history of hypercoagulable state.  No history of easy bleeding.  No history of anemia  Gastrointestinal: No hematochezia or melena,  No gastroesophageal reflux, no trouble  swallowing  Urinary: [ ]  chronic Kidney disease, [ ]  on HD - [ ]  MWF or [ ]  TTHS, [ ]  Burning with urination, [ ]  Frequent urination, [ ]  Difficulty urinating;   Skin: No rashes  Psychological: No history of anxiety,  No history of depression  Physical Examination  Vitals:   02/19/23 1225  BP: 124/82  Pulse: 72  Temp: 98 F (36.7 C)  TempSrc: Temporal  SpO2: 96%  Weight: 159 lb (72.1 kg)    Body mass index is 29.08 kg/m.  General:  Alert and oriented, no acute distress HEENT: Normal Neck: No bruit or JVD Pulmonary: Clear to auscultation bilaterally Cardiac: Regular Rate and Rhythm without murmur Abdomen: Soft, non-tender, non-distended, no mass, no scars Skin: No rash Extremity Pulses:  2+ radial, femoral, dorsalis pedis, pulses bilaterally Musculoskeletal: No deformity or edema  Neurologic: Upper and lower extremity motor 5/5 and symmetric  DATA: +--------------+---------+------+-----------+------------+---------+  LEFT         Reflux NoRefluxReflux TimeDiameter cmsComments                           Yes                                    +--------------+---------+------+-----------+------------+---------+  CFV                    yes   >1 second                        +--------------+---------+------+-----------+------------+---------+  FV prox       no                                               +--------------+---------+------+-----------+------------+---------+  FV mid        no                                               +--------------+---------+------+-----------+------------+---------+  FV dist       no                                               +--------------+---------+------+-----------+------------+---------+  Popliteal    no                                               +--------------+---------+------+-----------+------------+---------+  GSV at [facility]    no                            0.57                +--------------+---------+------+-----------+------------+---------+  GSV prox thighno                            0.49               +--------------+---------+------+-----------+------------+---------+  GSV mid thigh           yes    >500 ms      0.27               +--------------+---------+------+-----------+------------+---------+  GSV dist thigh          yes    >500 ms      0.24               +--------------+---------+------+-----------+------------+---------+  GSV at knee   no                            0.38    branching  +--------------+---------+------+-----------+------------+---------+  GSV prox calf           yes    >500 ms      0.27    branching  +--------------+---------+------+-----------+------------+---------+  SSV Pop Fossa no                            0.44               +--------------+---------+------+-----------+------------+---------+  SSV prox calf no                            0.53               +--------------+---------+------+-----------+------------+---------+      Summary:  Left:  - No evidence of deep vein thrombosis seen in the left lower extremity,  from the common femoral through the popliteal veins.  - No evidence of superficial venous thrombosis in the left lower  extremity.    - No evidence of superficial venous reflux seen in the left short  saphenous vein.    - Venous reflux is noted in the left common femoral vein.  - Venous reflux is noted in  the left greater saphenous vein in the thigh.  - Venous reflux is noted in the left greater saphenous vein in the calf.     Assessment/Plan: Venous reflux in the GSV and CFV in the deep system no reflux at the SFJ. No evidence or history of DVT.   The vein size with reflux is small.   There is no reflux at the junction.   She will not be a candidate for laser ablation at this time.  I placed her in compression socks, showed her elevation techniques and  asked her exercise.  She will f/u PRN she is not in danger of limb loss she has palpable pedal pulses.    Mosetta Pigeon PA-C [facility] Office: [phone]  MD in clinic [place]

## 2023-03-30 ENCOUNTER — Other Ambulatory Visit: Payer: Self-pay | Admitting: Family Medicine

## 2023-03-30 DIAGNOSIS — G4762 Sleep related leg cramps: Secondary | ICD-10-CM

## 2023-04-06 ENCOUNTER — Other Ambulatory Visit: Payer: Self-pay | Admitting: Family Medicine

## 2023-04-06 DIAGNOSIS — M7918 Myalgia, other site: Secondary | ICD-10-CM

## 2023-04-10 ENCOUNTER — Ambulatory Visit: Payer: Medicare HMO | Admitting: Family Medicine

## 2023-04-15 DIAGNOSIS — H04123 Dry eye syndrome of bilateral lacrimal glands: Secondary | ICD-10-CM | POA: Diagnosis not present

## 2023-04-15 DIAGNOSIS — H0100B Unspecified blepharitis left eye, upper and lower eyelids: Secondary | ICD-10-CM | POA: Diagnosis not present

## 2023-04-15 DIAGNOSIS — H0100A Unspecified blepharitis right eye, upper and lower eyelids: Secondary | ICD-10-CM | POA: Diagnosis not present

## 2023-08-12 ENCOUNTER — Ambulatory Visit: Payer: Self-pay

## 2023-08-12 NOTE — Telephone Encounter (Signed)
Noted patient scheduled

## 2023-08-12 NOTE — Telephone Encounter (Signed)
  Chief Complaint: frequent urination, BLE pain Symptoms: swelling BLE Frequency: ongoing, "for a long time" Pertinent Negatives: Patient denies fever, SOB,  Disposition: [] ED /[] Urgent Care (no appt availability in office) / [x] Appointment(In office/virtual)/ []  [facility]/ [] Home Care/ [] Refused Recommended Disposition /[] [facility]/ []  Follow-up with PCP Additional Notes: Pt states that she is having bilateral leg pain. Pt states that she is having swelling in the L leg. Pt states that she has R side breast pain. Pt states that both legs were swollen, but lately only L. Pt states that she has also been urinating often, and having burning while she voids. Pt scheduled for tomorrow.  Reason for Disposition  [1] MODERATE pain (e.g., interferes with normal activities, limping) AND [2] present > 3 days  Answer Assessment - Initial Assessment Questions 1. ONSET: "When did the pain start?"      For a long time 2. LOCATION: "Where is the pain located?"      legs 3. PAIN: "How bad is the pain?"    (Scale 1-10; or mild, moderate, severe)   -  MILD (1-3): doesn't interfere with normal activities    -  MODERATE (4-7): interferes with normal activities (e.g., work or school) or awakens from sleep, limping    -  SEVERE (8-10): excruciating pain, unable to do any normal activities, unable to walk     10 4. WORK OR EXERCISE: "Has there been any recent work or exercise that involved this part of the body?"      Denies  5. CAUSE: "What do you think is causing the leg pain?"     unsure 6. OTHER SYMPTOMS: "Do you have any other symptoms?" (e.g., chest pain, back pain, breathing difficulty, swelling, rash, fever, numbness, weakness)     denies  Protocols used: Leg Pain-A-AH

## 2023-08-13 ENCOUNTER — Ambulatory Visit: Payer: Self-pay

## 2023-08-13 VITALS — BP 137/87 | HR 83 | Ht 62.0 in | Wt 155.0 lb

## 2023-08-13 DIAGNOSIS — I83813 Varicose veins of bilateral lower extremities with pain: Secondary | ICD-10-CM | POA: Diagnosis not present

## 2023-08-13 DIAGNOSIS — O2200 Varicose veins of lower extremity in pregnancy, unspecified trimester: Secondary | ICD-10-CM

## 2023-08-13 DIAGNOSIS — G8929 Other chronic pain: Secondary | ICD-10-CM | POA: Diagnosis not present

## 2023-08-13 DIAGNOSIS — M25562 Pain in left knee: Secondary | ICD-10-CM

## 2023-08-13 DIAGNOSIS — R300 Dysuria: Secondary | ICD-10-CM | POA: Diagnosis not present

## 2023-08-13 DIAGNOSIS — G2581 Restless legs syndrome: Secondary | ICD-10-CM

## 2023-08-13 MED ORDER — NITROFURANTOIN MONOHYD MACRO 100 MG PO CAPS
100.0000 mg | ORAL_CAPSULE | Freq: Two times a day (BID) | ORAL | 0 refills | Status: AC
Start: 2023-08-13 — End: 2023-08-18

## 2023-08-13 MED ORDER — GABAPENTIN 100 MG PO CAPS: 100.0000 mg | ORAL_CAPSULE | Freq: Every day | ORAL | 2 refills | Status: DC

## 2023-08-13 MED ORDER — ASPIRIN 81 MG PO TBEC: 81.0000 mg | DELAYED_RELEASE_TABLET | Freq: Every day | ORAL | 1 refills | Status: AC

## 2023-08-13 NOTE — Progress Notes (Signed)
 Acute Office Visit  Subjective:     Patient ID: Sheri Rice, female    DOB: 06-21-1955, 68 y.o.   MRN: [id number]  Chief Complaint  Patient presents with   Medical Management of Chronic Issues    Pt here states "Urinary signs and symptoms and bilateral leg pain"    HPI Patient is in today for frequent urination, left knee pain, bilateral leg pain at bedtime.   ROS      Objective:    BP 137/87   Pulse 83   Ht 5\' 2"  (1.575 m)   Wt 155 lb 0.6 oz (70.3 kg)   SpO2 94%   BMI 28.36 kg/m    Physical Exam Vitals and nursing note reviewed.  Constitutional:      Appearance: Normal appearance.  Eyes:     Extraocular Movements: Extraocular movements intact.     Pupils: Pupils are equal, round, and reactive to light.  Cardiovascular:     Rate and Rhythm: Normal rate and regular rhythm.  Pulmonary:     Effort: Pulmonary effort is normal.     Breath sounds: Normal breath sounds.  Musculoskeletal:     Right knee: Normal.     Left knee: Swelling present. Decreased range of motion. Tenderness present over the medial joint line.     Comments: Varicosities present in bilat legs, some large and prominent  Neurological:     Mental Status: She is alert and oriented to person, place, and time.  Psychiatric:        Mood and Affect: Mood normal.        Thought Content: Thought content normal.     Results for orders placed or performed in visit on 08/13/23  Urine Culture   Specimen: Urine   UR  Result Value Ref Range   Urine Culture, Routine Final report (A)    Organism ID, Bacteria Comment (A)         Assessment & Plan:   Problem List Items Addressed This Visit       Other   Chronic pain of left knee - Primary   Relevant Medications   aspirin  EC 81 MG tablet   gabapentin  (NEURONTIN ) 100 MG capsule   Other Relevant Orders   Ambulatory referral to Orthopedic Surgery   Other Visit Diagnoses       Dysuria       Relevant Medications   nitrofurantoin ,  macrocrystal-monohydrate, (MACROBID ) 100 MG capsule   Other Relevant Orders   Urine Culture (Completed)     Varicose veins of both lower extremities during pregnancy       Relevant Medications   aspirin  EC 81 MG tablet     Restless leg syndrome       Relevant Medications   gabapentin  (NEURONTIN ) 100 MG capsule       Meds ordered this encounter  Medications   aspirin  EC 81 MG tablet    Sig: Take 1 tablet (81 mg total) by mouth daily. Swallow whole.    Dispense:  90 tablet    Refill:  1   nitrofurantoin , macrocrystal-monohydrate, (MACROBID ) 100 MG capsule    Sig: Take 1 capsule (100 mg total) by mouth 2 (two) times daily for 5 days.    Dispense:  10 capsule    Refill:  0   gabapentin  (NEURONTIN ) 100 MG capsule    Sig: Take 1 capsule (100 mg total) by mouth at bedtime.    Dispense:  30 capsule    Refill:  2  No follow-ups on file.  Alison Irvine, FNP

## 2023-08-15 LAB — URINE CULTURE

## 2023-08-18 ENCOUNTER — Ambulatory Visit: Payer: Self-pay

## 2023-08-26 ENCOUNTER — Other Ambulatory Visit (HOSPITAL_COMMUNITY): Payer: Self-pay | Admitting: Family Medicine

## 2023-08-26 ENCOUNTER — Encounter: Payer: Self-pay | Admitting: Orthopaedic Surgery

## 2023-08-26 ENCOUNTER — Other Ambulatory Visit (INDEPENDENT_AMBULATORY_CARE_PROVIDER_SITE_OTHER): Payer: Self-pay

## 2023-08-26 ENCOUNTER — Ambulatory Visit: Admitting: Orthopaedic Surgery

## 2023-08-26 VITALS — BP 133/85 | HR 80 | Ht 62.0 in | Wt 155.0 lb

## 2023-08-26 DIAGNOSIS — G8929 Other chronic pain: Secondary | ICD-10-CM

## 2023-08-26 DIAGNOSIS — M545 Low back pain, unspecified: Secondary | ICD-10-CM | POA: Diagnosis not present

## 2023-08-26 DIAGNOSIS — M25561 Pain in right knee: Secondary | ICD-10-CM | POA: Diagnosis not present

## 2023-08-26 DIAGNOSIS — M25562 Pain in left knee: Secondary | ICD-10-CM | POA: Diagnosis not present

## 2023-08-26 DIAGNOSIS — Z1231 Encounter for screening mammogram for malignant neoplasm of breast: Secondary | ICD-10-CM

## 2023-08-26 MED ORDER — HYDROCODONE-ACETAMINOPHEN 5-325 MG PO TABS: ORAL_TABLET | ORAL | 0 refills | Status: DC

## 2023-08-26 MED ORDER — CYCLOBENZAPRINE HCL 10 MG PO TABS: 10.0000 mg | ORAL_TABLET | Freq: Every day | ORAL | 0 refills | Status: AC

## 2023-08-26 NOTE — Progress Notes (Signed)
 My back and knee hurt.  She has interpreter present.  She has lower back pain on the right side that goes past the right knee.  She has right knee pain also. She has no trauma.  She has tried Tylenol  without help.  Ice and heat have not helped. She had arthroscopy of the left knee by Dr. Phyllis Breeze in 2023.  She has no weakness, no numbness.  She has no redness.  Right knee does not give way but pops and swells some.  Right knee has ROM 0 to 110, no limp, slight medial pain, slight effusion and crepitus, stable, NV intact, no distal edema.  Lower back is tender more on the right side, no spasm, ROM is good, NV intact, SLR negative, muscle tone and strength are normal.  X-rays done of the right knee and lower back, reported separately.  Encounter Diagnoses  Name Primary?   Chronic pain of left knee Yes   Chronic pain of right knee    Lumbar pain    I will give Flexeril  at night.  I have reviewed the [place]  Controlled Substance Reporting System web site prior to prescribing narcotic medicine for this patient.  I will begin PT for the back.  Return in three weeks.  Take Aleve one bid, do not take when she is taking her ASA.  Call if any problem.  Precautions discussed.  Electronically Signed Pleasant Brilliant, MD 5/28/20259:14 AM

## 2023-08-26 NOTE — Patient Instructions (Signed)
 You have been referred to Physical therapy at [facility]. They will call you to schedule appt.

## 2023-09-07 ENCOUNTER — Encounter (HOSPITAL_COMMUNITY): Payer: Self-pay

## 2023-09-07 ENCOUNTER — Ambulatory Visit (HOSPITAL_COMMUNITY)
Admission: RE | Admit: 2023-09-07 | Discharge: 2023-09-07 | Disposition: A | Discharge status: Home / Self Care | Source: Ambulatory Visit | Attending: Family Medicine | Admitting: Family Medicine

## 2023-09-07 DIAGNOSIS — Z1231 Encounter for screening mammogram for malignant neoplasm of breast: Secondary | ICD-10-CM | POA: Diagnosis not present

## 2023-09-10 ENCOUNTER — Other Ambulatory Visit (HOSPITAL_COMMUNITY): Payer: Self-pay | Admitting: Family Medicine

## 2023-09-10 DIAGNOSIS — R928 Other abnormal and inconclusive findings on diagnostic imaging of breast: Secondary | ICD-10-CM

## 2023-09-16 ENCOUNTER — Ambulatory Visit: Admitting: Orthopaedic Surgery

## 2023-09-17 ENCOUNTER — Ambulatory Visit (HOSPITAL_COMMUNITY)
Admission: RE | Admit: 2023-09-17 | Discharge: 2023-09-17 | Discharge status: Home / Self Care | Source: Ambulatory Visit | Attending: Family Medicine

## 2023-09-17 ENCOUNTER — Ambulatory Visit (HOSPITAL_COMMUNITY)
Admission: RE | Admit: 2023-09-17 | Discharge: 2023-09-17 | Disposition: A | Discharge status: Home / Self Care | Source: Ambulatory Visit | Attending: Family Medicine | Admitting: Family Medicine

## 2023-09-17 ENCOUNTER — Encounter (HOSPITAL_COMMUNITY): Payer: Self-pay

## 2023-09-17 DIAGNOSIS — R921 Mammographic calcification found on diagnostic imaging of breast: Secondary | ICD-10-CM | POA: Diagnosis not present

## 2023-09-17 DIAGNOSIS — R928 Other abnormal and inconclusive findings on diagnostic imaging of breast: Secondary | ICD-10-CM | POA: Diagnosis not present

## 2023-09-17 DIAGNOSIS — R92321 Mammographic fibroglandular density, right breast: Secondary | ICD-10-CM | POA: Diagnosis not present

## 2023-09-18 ENCOUNTER — Other Ambulatory Visit: Payer: Self-pay | Admitting: Family Medicine

## 2023-09-18 DIAGNOSIS — G2581 Restless legs syndrome: Secondary | ICD-10-CM

## 2023-09-18 MED ORDER — GABAPENTIN 100 MG PO CAPS: 100.0000 mg | ORAL_CAPSULE | Freq: Every day | ORAL | 2 refills | Status: DC

## 2023-09-18 NOTE — Telephone Encounter (Signed)
 Copied from CRM [id number]. Topic: Clinical - Medication Refill >> Sep 18, 2023  9:14 AM Donald Frost wrote: Medication: gabapentin  (NEURONTIN ) 100 MG capsule  Has the patient contacted their pharmacy? Yes   This is the patient's preferred pharmacy:  [facility] - [place], [place] - [address] [place] [address] Phone: [phone] Fax: [phone]    Is this the correct pharmacy for this prescription? Yes If no, delete pharmacy and type the correct one.   Has the prescription been filled recently? Not sure  Is the patient out of the medication? No  Has the patient been seen for an appointment in the last year OR does the patient have an upcoming appointment? Yes  Can we respond through MyChart? Yes  Please assist patient further

## 2023-09-23 ENCOUNTER — Other Ambulatory Visit: Payer: Self-pay | Admitting: Internal Medicine

## 2023-09-23 ENCOUNTER — Other Ambulatory Visit: Payer: Self-pay | Admitting: Family Medicine

## 2023-09-23 DIAGNOSIS — E782 Mixed hyperlipidemia: Secondary | ICD-10-CM

## 2023-09-23 DIAGNOSIS — K2100 Gastro-esophageal reflux disease with esophagitis, without bleeding: Secondary | ICD-10-CM

## 2023-09-29 ENCOUNTER — Other Ambulatory Visit: Payer: Self-pay

## 2023-09-29 ENCOUNTER — Encounter (HOSPITAL_COMMUNITY): Payer: Self-pay

## 2023-09-29 ENCOUNTER — Ambulatory Visit (HOSPITAL_COMMUNITY): Attending: Orthopaedic Surgery

## 2023-09-29 DIAGNOSIS — M545 Low back pain, unspecified: Secondary | ICD-10-CM | POA: Diagnosis not present

## 2023-09-29 DIAGNOSIS — M6281 Muscle weakness (generalized): Secondary | ICD-10-CM | POA: Insufficient documentation

## 2023-09-29 NOTE — Therapy (Signed)
 OUTPATIENT PHYSICAL THERAPY THORACOLUMBAR EVALUATION   Patient Name: Sheri Rice MRN: [id number] DOB:Sep 14, 1955, 68 y.o., female Today's Date: 09/29/2023  END OF SESSION:  PT End of Session - 09/29/23 0808     Visit Number 1    Number of Visits 9    Date for PT Re-Evaluation 10/27/23    Authorization Type [employer]    Authorization Time Period seeking new auth    PT Start Time 0800    PT Stop Time 0847    PT Time Calculation (min) 47 min    Activity Tolerance Patient tolerated treatment well    Behavior During Therapy WFL for tasks assessed/performed          Past Medical History:  Diagnosis Date   GERD (gastroesophageal reflux disease)    Hyperlipidemia    Hypothyroidism    Past Surgical History:  Procedure Laterality Date   BALLOON DILATION N/A 11/26/2021   Procedure: BALLOON DILATION;  Surgeon: Cindie Carlin POUR, DO;  [facility];  Service: Endoscopy;  Laterality: N/A;   BIOPSY  11/26/2021   Procedure: BIOPSY;  Surgeon: Cindie Carlin POUR, DO;  [facility];  Service: Endoscopy;;   CHONDROPLASTY Left 07/23/2021   Procedure: CHONDROPLASTY;  Surgeon: Margrette Taft BRAVO, MD;  Location: [facility];  Service: Orthopedics;  Laterality: Left;   COLONOSCOPY  06/2017   Dr. Norleen Kiang;  Small internal hemorrhoids, otherwise normal exam. Repeat in 10 years.   DILATION AND CURETTAGE OF UTERUS     ESOPHAGOGASTRODUODENOSCOPY  06/2017   Dr. Norleen Kiang; Normal exam   ESOPHAGOGASTRODUODENOSCOPY (EGD) WITH PROPOFOL  N/A 11/26/2021   Procedure: ESOPHAGOGASTRODUODENOSCOPY (EGD) WITH PROPOFOL ;  Surgeon: Cindie Carlin POUR, DO;  [facility];  Service: Endoscopy;  Laterality: N/A;  10:30 am   KNEE ARTHROSCOPY WITH MEDIAL MENISECTOMY Left 07/23/2021   Procedure: KNEE ARTHROSCOPY WITH MEDIAL MENISECTOMY;  Surgeon: Margrette Taft BRAVO, MD;  Location: [facility];  Service: Orthopedics;  Laterality: Left;   Patient Active Problem List   Diagnosis Date Noted    Pain in multiple muscles 01/05/2023   Facial swelling 01/05/2023   Spider telangiectasia 12/09/2022   Knee pain, bilateral 04/22/2022   Viral illness 04/22/2022   Nocturnal leg cramps 01/14/2022   Left shoulder pain 01/14/2022   Dysphagia 11/20/2021   Chondromalacia patellae, left knee    Sore throat 07/16/2021   Rash and other nonspecific skin eruption 06/04/2021   Encounter for screening mammogram for malignant neoplasm of breast 04/08/2021   Post-menopausal 04/08/2021   Obesity (BMI 30-39.9) 04/08/2021   Hypothyroidism 12/18/2020   Chronic pain of left knee 12/18/2020   Globus sensation 04/06/2017   Gastroesophageal reflux disease 02/23/2017   Weakness 03/03/2016   Herpes zoster 09/25/2015   Urinary urgency 04/18/2015   DDD (degenerative disc disease), cervical 03/16/2015   Osteoarthritis of both knees 03/16/2015   COUGH 03/18/2010   HYPERLIPIDEMIA, MIXED, MILD 05/28/2007   ANAL OR RECTAL PAIN 05/28/2007   URINARY INCONTINENCE, MIXED 02/05/2007   RESTLESS LEG SYNDROME 12/08/2006   CHEST PAIN, ATYPICAL 12/08/2006   DYSMENORRHEA 12/05/2006   Headache 12/05/2006   Allergic rhinitis 12/04/2006   PLANTAR FASCIITIS, BILATERAL 10/05/2006   HOARSENESS 07/28/2006   FIBROIDS, UTERUS 03/31/2001    PCP: Zarwolo, Gloria, FNP  REFERRING PROVIDER: Brenna Lin, MD  REFERRING DIAG: M54.50 (ICD-10-CM) - Lumbar pain   Rationale for Evaluation and Treatment: Rehabilitation  THERAPY DIAG:  Lumbar pain  Muscle weakness (generalized)  ONSET DATE: ~ 5 years  SUBJECTIVE:  SUBJECTIVE STATEMENT: Pt reports pain that started in her right knee that has radiatd up to hip and back and now sometimes in her neck. Pt also reports burngin pain the middle of her knee, pointing to pes anserine area. Pt reports  bony like pain. Pt's referring provider reporting that her pain is coming from her back and not her knees. Pt reports being active, goes for walks throughout the day. Pt reports walking and talking about her spiritual beliefs.  Onset of pain is waking up in the morning. Pain mainly in her legs.   PERTINENT HISTORY:    PAIN:  Are you having pain? Yes: NPRS scale: 6/10  worst: 9/10 Pain location: low back, knees, total body Pain description: Deep, achy Aggravating factors: waking up from sleeping Relieving factors: walking and movement   PRECAUTIONS: None  RED FLAGS: Bowel or bladder incontinence: No   WEIGHT BEARING RESTRICTIONS: No  FALLS:  Has patient fallen in last 6 months? No   PATIENT GOALS: Be able to sleep well.   NEXT MD VISIT: in 3 weeks  OBJECTIVE:  Note: Objective measures were completed at Evaluation unless otherwise noted.  DIAGNOSTIC FINDINGS:    PATIENT SURVEYS:  Modified Oswestry: 23 / 50 = 46.0 %   COGNITION: Overall cognitive status: Within functional limits for tasks assessed     SENSATION: Light touch: reduced light touch on LLE compared to RLE   POSTURE: increased lumbar lordosis and anterior pelvic tilt  PALPATION: Tender to palpate at right gluteal medius  LUMBAR ROM:   AROM eval  Flexion 100%  Extension 100% and painful in thoracic spine  Right lateral flexion 100% and painful  Left lateral flexion 100% and not painful  Right rotation 100% and painful in right QL  Left rotation 100%    (Blank rows = not tested)  LOWER EXTREMITY ROM:     Active  Right eval Left eval  Hip flexion    Hip extension    Hip abduction    Hip adduction    Hip internal rotation    Hip external rotation    Knee flexion    Knee extension    Ankle dorsiflexion    Ankle plantarflexion    Ankle inversion    Ankle eversion     (Blank rows = not tested)  LOWER EXTREMITY MMT:    MMT Right eval Left eval  Hip flexion 3+ 3+  Hip extension 4- 3+   Hip abduction 4 4-  Hip adduction    Hip internal rotation    Hip external rotation    Knee flexion    Knee extension 5 4-  Ankle dorsiflexion    Ankle plantarflexion    Ankle inversion    Ankle eversion     (Blank rows = not tested)  LUMBAR SPECIAL TESTS:  Straight leg raise test: Positive, Slump test: Positive, and SI Compression/distraction test: Positive  FUNCTIONAL TESTS:  30 Second Chair Stand Test: 24x  Norms:   Age 62-64 65-69 70-74 75-79 80-84 85-89 90-94  Women 15 15 14 13 12 11 9   Men 17 16 15 14 13 11 9     : 670ft  GAIT: Distance walked: 676ft Assistive device utilized: None Level of assistance: Complete Independence Comments: long stride length  TREATMENT DATE:   09/29/2023 PT Evaluation                LTR x 30 Supine bridge x 15 Supine nerve glide x 15  PATIENT EDUCATION:  Education details: PT Evaluation, findings, prognosis, frequency, attendance policy, and sleeping position with pillow under neath knees and HEP. Person educated: Patient Education method: Medical illustrator Education comprehension: verbalized understanding  HOME EXERCISE PROGRAM: Access Code: [email or web address] URL: [email or web address] Date: 09/29/2023 Prepared by: Omega Bottcher  Exercises - Supine Lower Trunk Rotation  - 1 x daily - 7 x weekly - 3 sets - 30 reps - Supine Bridge  - 1 x daily - 7 x weekly - 3 sets - 15 reps - Supine Sciatic Nerve Glide  - 1 x daily - 7 x weekly - 3 sets - 15-20 reps  ASSESSMENT:  CLINICAL IMPRESSION: Patient is a 68 y.o. female who was seen today for physical therapy evaluation and treatment for M54.50 (ICD-10-CM) - Lumbar pain. Pt demonstrates appropriate lumbar ROM and no particular painful patterns of movement but reduced muscle weakness in gluteal and hip musculature. Pt reports reduced activity  tolerance and most of her pain is in the morning. Wide spread pain that affects neck and knees as well. Level of activity limitations indicate 2x for 4 weeks based upon objective impairments and outcome measure. Pt will benefit from skilled Physical Therapy services to address deficits/limitations in order to improve functional and QOL.    OBJECTIVE IMPAIRMENTS: decreased activity tolerance, decreased strength, postural dysfunction, and pain.   ACTIVITY LIMITATIONS: carrying, lifting, bending, and stairs  PARTICIPATION LIMITATIONS: meal prep  PERSONAL FACTORS: Age are also affecting patient's functional outcome.   REHAB POTENTIAL: Good  CLINICAL DECISION MAKING: Stable/uncomplicated  EVALUATION COMPLEXITY: Low   GOALS: Goals reviewed with patient? No  SHORT TERM GOALS: Target date: 10/13/23  Pt will be independent with HEP in order to demonstrate participation in Physical Therapy POC.  Baseline: Goal status: INITIAL  2.  Pt will report 4/10 pain with mobility in order to demonstrate improved pain with ADLs.  Baseline:  Goal status: INITIAL  LONG TERM GOALS: Target date: 10/27/23  Pt will improve BLE MMT by at least 1/2 grade in order to demonstrate improved functional strength to return to desired activities.  Baseline: see objective.  Goal status: INITIAL  2.  Pt will improve 2 MWT by 141ft in order to demonstrate improved functional ambulatory capacity in community setting.  Baseline: see objective.  Goal status: INITIAL  3.  Pt will improve Modified Oswestry score by 15% in order to demonstrate improved pain with functional goals and outcomes. Baseline: see objective.  Goal status: INITIAL  4.  Pt will report 2/10 pain with mobility in order to demonstrate reduced pain with ADLs lasting greater than 30 minutes.  Baseline: see objective.  Goal status: INITIAL  PLAN:  PT FREQUENCY: 2x/week  PT DURATION: 4 weeks  PLANNED INTERVENTIONS: 97164- PT Re-evaluation,  97110-Therapeutic exercises, 97530- Therapeutic activity, [id number]- Neuromuscular re-education, 97535- Self Care, 02859- Manual therapy, G0283- Electrical stimulation (unattended), [id number]- Electrical stimulation (manual), 20560 (1-2 muscles), 20561 (3+ muscles)- Dry Needling, Balance training, Cryotherapy, and Moist heat.  PLAN FOR NEXT SESSION: decompression activities, functional strengthening  Omega JONETTA Bottcher ALMETA, DPT [facility] [place] [phone] office  [facility] Auth Request  Referring diagnosis code (ICD 10)? M54.50  Treatment diagnosis codes (ICD 10)? (if different than referring diagnosis) M62.81 What was this (referring dx) caused by? []  Surgery []  Fall [x]  Ongoing issue []  Arthritis []  Other: ____________  Laterality: []  Rt []  Lt [x]  Both  Deficits: [x]  Pain [x]  Stiffness [x]  Weakness []  Edema []  Balance Deficits []  Coordination []  Gait Disturbance []   ROM []  Other   Functional Tool Score: 46%  CPT codes: See Planned Interventions listed in the Plan section of the Evaluation.    Omega JONETTA Bottcher, PT 09/29/2023, 8:50 AM

## 2023-09-30 ENCOUNTER — Ambulatory Visit: Admitting: Orthopaedic Surgery

## 2023-09-30 ENCOUNTER — Ambulatory Visit: Payer: Medicare HMO

## 2023-09-30 ENCOUNTER — Encounter: Payer: Self-pay | Admitting: Orthopaedic Surgery

## 2023-09-30 VITALS — BP 128/79 | HR 98

## 2023-09-30 DIAGNOSIS — M545 Low back pain, unspecified: Secondary | ICD-10-CM

## 2023-09-30 NOTE — Progress Notes (Signed)
 I am better.  She has been to PT and is improving.  I have reviewed the notes.  She has less pain and is moving better.  ROM of back is good, muscle tone and strength is good, NV intact.  Encounter Diagnosis  Name Primary?   Lumbar pain Yes   Continue PT.  Continue medicine.  Return in one month.  Call if any problem.  Precautions discussed.  Electronically Signed Lemond Stable, MD 7/2/202510:15 AM

## 2023-10-01 ENCOUNTER — Ambulatory Visit (HOSPITAL_COMMUNITY)

## 2023-10-01 ENCOUNTER — Encounter (HOSPITAL_COMMUNITY)

## 2023-10-01 ENCOUNTER — Encounter (HOSPITAL_COMMUNITY): Payer: Self-pay

## 2023-10-01 DIAGNOSIS — M545 Low back pain, unspecified: Secondary | ICD-10-CM | POA: Diagnosis not present

## 2023-10-01 DIAGNOSIS — M6281 Muscle weakness (generalized): Secondary | ICD-10-CM | POA: Diagnosis not present

## 2023-10-01 NOTE — Therapy (Signed)
 OUTPATIENT PHYSICAL THERAPY THORACOLUMBAR TREATMENT   Patient Name: Sheri Rice MRN: [id number] DOB:1955/06/20, 68 y.o., female Today's Date: 10/01/2023  END OF SESSION:  PT End of Session - 10/01/23 1653     Visit Number 2    Number of Visits 9    Date for PT Re-Evaluation 10/27/23    Authorization Type [employer]    Authorization Time Period seeking new auth    PT Start Time 1614    PT Stop Time 1650    PT Time Calculation (min) 36 min    Activity Tolerance Patient tolerated treatment well    Behavior During Therapy WFL for tasks assessed/performed           Past Medical History:  Diagnosis Date   GERD (gastroesophageal reflux disease)    Hyperlipidemia    Hypothyroidism    Past Surgical History:  Procedure Laterality Date   BALLOON DILATION N/A 11/26/2021   Procedure: BALLOON DILATION;  Surgeon: Cindie Carlin POUR, DO;  [facility];  Service: Endoscopy;  Laterality: N/A;   BIOPSY  11/26/2021   Procedure: BIOPSY;  Surgeon: Cindie Carlin POUR, DO;  [facility];  Service: Endoscopy;;   CHONDROPLASTY Left 07/23/2021   Procedure: CHONDROPLASTY;  Surgeon: Margrette Taft BRAVO, MD;  Location: [facility];  Service: Orthopedics;  Laterality: Left;   COLONOSCOPY  06/2017   Dr. Norleen Kiang;  Small internal hemorrhoids, otherwise normal exam. Repeat in 10 years.   DILATION AND CURETTAGE OF UTERUS     ESOPHAGOGASTRODUODENOSCOPY  06/2017   Dr. Norleen Kiang; Normal exam   ESOPHAGOGASTRODUODENOSCOPY (EGD) WITH PROPOFOL  N/A 11/26/2021   Procedure: ESOPHAGOGASTRODUODENOSCOPY (EGD) WITH PROPOFOL ;  Surgeon: Cindie Carlin POUR, DO;  [facility];  Service: Endoscopy;  Laterality: N/A;  10:30 am   KNEE ARTHROSCOPY WITH MEDIAL MENISECTOMY Left 07/23/2021   Procedure: KNEE ARTHROSCOPY WITH MEDIAL MENISECTOMY;  Surgeon: Margrette Taft BRAVO, MD;  Location: [facility];  Service: Orthopedics;  Laterality: Left;   Patient Active Problem List   Diagnosis Date Noted    Pain in multiple muscles 01/05/2023   Facial swelling 01/05/2023   Spider telangiectasia 12/09/2022   Knee pain, bilateral 04/22/2022   Viral illness 04/22/2022   Nocturnal leg cramps 01/14/2022   Left shoulder pain 01/14/2022   Dysphagia 11/20/2021   Chondromalacia patellae, left knee    Sore throat 07/16/2021   Rash and other nonspecific skin eruption 06/04/2021   Encounter for screening mammogram for malignant neoplasm of breast 04/08/2021   Post-menopausal 04/08/2021   Obesity (BMI 30-39.9) 04/08/2021   Hypothyroidism 12/18/2020   Chronic pain of left knee 12/18/2020   Globus sensation 04/06/2017   Gastroesophageal reflux disease 02/23/2017   Weakness 03/03/2016   Herpes zoster 09/25/2015   Urinary urgency 04/18/2015   DDD (degenerative disc disease), cervical 03/16/2015   Osteoarthritis of both knees 03/16/2015   COUGH 03/18/2010   HYPERLIPIDEMIA, MIXED, MILD 05/28/2007   ANAL OR RECTAL PAIN 05/28/2007   URINARY INCONTINENCE, MIXED 02/05/2007   RESTLESS LEG SYNDROME 12/08/2006   CHEST PAIN, ATYPICAL 12/08/2006   DYSMENORRHEA 12/05/2006   Headache 12/05/2006   Allergic rhinitis 12/04/2006   PLANTAR FASCIITIS, BILATERAL 10/05/2006   HOARSENESS 07/28/2006   FIBROIDS, UTERUS 03/31/2001    PCP: Zarwolo, Gloria, FNP  REFERRING PROVIDER: Brenna Lin, MD  REFERRING DIAG: M54.50 (ICD-10-CM) - Lumbar pain   Rationale for Evaluation and Treatment: Rehabilitation  THERAPY DIAG:  Lumbar pain  Muscle weakness (generalized)  ONSET DATE: ~ 5 years  SUBJECTIVE:  SUBJECTIVE STATEMENT: Pt arrives late, limits session time. Reports she tried HEP at home yesterday. No issues as she's limiting motion to her pain. Reports her pain is a 4/10 right now. Usually increased at night.    EVAL: Pt  reports pain that started in her right knee that has radiatd up to hip and back and now sometimes in her neck. Pt also reports burngin pain the middle of her knee, pointing to pes anserine area. Pt reports bony like pain. Pt's referring provider reporting that her pain is coming from her back and not her knees. Pt reports being active, goes for walks throughout the day. Pt reports walking and talking about her spiritual beliefs.  Onset of pain is waking up in the morning. Pain mainly in her legs.   PERTINENT HISTORY:    PAIN:  Are you having pain? Yes: NPRS scale: 6/10  worst: 9/10 Pain location: low back, knees, total body Pain description: Deep, achy Aggravating factors: waking up from sleeping Relieving factors: walking and movement   PRECAUTIONS: None  RED FLAGS: Bowel or bladder incontinence: No   WEIGHT BEARING RESTRICTIONS: No  FALLS:  Has patient fallen in last 6 months? No   PATIENT GOALS: Be able to sleep well.   NEXT MD VISIT: in 3 weeks  OBJECTIVE:  Note: Objective measures were completed at Evaluation unless otherwise noted.  DIAGNOSTIC FINDINGS:    PATIENT SURVEYS:  Modified Oswestry: 23 / 50 = 46.0 %   COGNITION: Overall cognitive status: Within functional limits for tasks assessed     SENSATION: Light touch: reduced light touch on LLE compared to RLE   POSTURE: increased lumbar lordosis and anterior pelvic tilt  PALPATION: Tender to palpate at right gluteal medius  LUMBAR ROM:   AROM eval  Flexion 100%  Extension 100% and painful in thoracic spine  Right lateral flexion 100% and painful  Left lateral flexion 100% and not painful  Right rotation 100% and painful in right QL  Left rotation 100%    (Blank rows = not tested)  LOWER EXTREMITY ROM:     Active  Right eval Left eval  Hip flexion    Hip extension    Hip abduction    Hip adduction    Hip internal rotation    Hip external rotation    Knee flexion    Knee extension    Ankle  dorsiflexion    Ankle plantarflexion    Ankle inversion    Ankle eversion     (Blank rows = not tested)  LOWER EXTREMITY MMT:    MMT Right eval Left eval  Hip flexion 3+ 3+  Hip extension 4- 3+  Hip abduction 4 4-  Hip adduction    Hip internal rotation    Hip external rotation    Knee flexion    Knee extension 5 4-  Ankle dorsiflexion    Ankle plantarflexion    Ankle inversion    Ankle eversion     (Blank rows = not tested)  LUMBAR SPECIAL TESTS:  Straight leg raise test: Positive, Slump test: Positive, and SI Compression/distraction test: Positive  FUNCTIONAL TESTS:  30 Second Chair Stand Test: 24x  Norms:   Age 73-64 46-69 70-74 75-79 80-84 85-89 90-94  Women 15 15 14 13 12 11 9   Men 17 16 15 14 13 11 9     : 653ft  GAIT: Distance walked: 656ft Assistive device utilized: None Level of assistance: Complete Independence Comments: long stride length  TREATMENT  DATE:  10/01/23: Review of goals and HEP LTR, 2x10 Supine Bridge, 2x10 Sciatic Nerve glide, 10x Clamshells, 12x Hamstring stretch, 2x30 Piriformis Stretch, 2x30 Sit to Stand, verbal cues for posture, 2x10  09/29/2023 PT Evaluation                LTR x 30 Supine bridge x 15 Supine nerve glide x 15                                                                                                                  PATIENT EDUCATION:  Education details: PT Evaluation, findings, prognosis, frequency, attendance policy, and sleeping position with pillow under neath knees and HEP. Person educated: Patient Education method: Medical illustrator Education comprehension: verbalized understanding  HOME EXERCISE PROGRAM: Access Code: [email or web address] URL: [email or web address] Date: 09/29/2023 Prepared by: Omega Bottcher  Exercises - Supine Lower Trunk Rotation  - 1 x daily - 7 x weekly - 3 sets - 30 reps - Supine Bridge  - 1 x daily - 7 x weekly - 3 sets - 15 reps - Supine Sciatic  Nerve Glide  - 1 x daily - 7 x weekly - 3 sets - 15-20 reps  Access Code: [email or web address] URL: [email or web address] Date: 10/01/2023 Prepared by: Rosaria Powell-Butler  Exercises - Clamshell  - 2 x daily - 7 x weekly - 2 sets - 10 reps - Seated Hamstring Stretch  - 2 x daily - 7 x weekly - 3 sets - 30 hold - Supine Piriformis Stretch with Foot on Ground  - 2 x daily - 7 x weekly - 3 sets - 30 hold - Sit to Stand  - 2 x daily - 7 x weekly - 2 sets - 10 reps  ASSESSMENT:  CLINICAL IMPRESSION: Patient tolerated session well. Began with review of goals, and HEP. Patient with good form throughout but cueing needed to reduce speed of exercises. Patient reports inc discomfort during nerve flossing which improves with correction of form. Patient reports her pain during the day usually stays around 4/10 but increases once rested at night. Added seated hamstring and piriformis stretch to HEP to hopefully aid in this, as well as sit to stand, and clamshells for LE strengthening. Patient tolerated all well. Patient will benefit from continued skilled physical therapy in order to address remaining deficits, reduce pain, and improve her overall function/QOL.    Patient is a 68 y.o. female who was seen today for physical therapy evaluation and treatment for M54.50 (ICD-10-CM) - Lumbar pain. Pt demonstrates appropriate lumbar ROM and no particular painful patterns of movement but reduced muscle weakness in gluteal and hip musculature. Pt reports reduced activity tolerance and most of her pain is in the morning. Wide spread pain that affects neck and knees as well. Level of activity limitations indicate 2x for 4 weeks based upon objective impairments and outcome measure. Pt will benefit from skilled Physical Therapy services to address deficits/limitations in order to improve functional and QOL.    OBJECTIVE IMPAIRMENTS: decreased activity tolerance, decreased  strength, postural dysfunction, and pain.    ACTIVITY LIMITATIONS: carrying, lifting, bending, and stairs  PARTICIPATION LIMITATIONS: meal prep  PERSONAL FACTORS: Age are also affecting patient's functional outcome.   REHAB POTENTIAL: Good  CLINICAL DECISION MAKING: Stable/uncomplicated  EVALUATION COMPLEXITY: Low   GOALS: Goals reviewed with patient? Yes  SHORT TERM GOALS: Target date: 10/13/23  Pt will be independent with HEP in order to demonstrate participation in Physical Therapy POC.  Baseline: Goal status: INITIAL  2.  Pt will report 4/10 pain with mobility in order to demonstrate improved pain with ADLs.  Baseline:  Goal status: INITIAL  LONG TERM GOALS: Target date: 10/27/23  Pt will improve BLE MMT by at least 1/2 grade in order to demonstrate improved functional strength to return to desired activities.  Baseline: see objective.  Goal status: INITIAL  2.  Pt will improve 2 MWT by 156ft in order to demonstrate improved functional ambulatory capacity in community setting.  Baseline: see objective.  Goal status: INITIAL  3.  Pt will improve Modified Oswestry score by 15% in order to demonstrate improved pain with functional goals and outcomes. Baseline: see objective.  Goal status: INITIAL  4.  Pt will report 2/10 pain with mobility in order to demonstrate reduced pain with ADLs lasting greater than 30 minutes.  Baseline: see objective.  Goal status: INITIAL  PLAN:  PT FREQUENCY: 2x/week  PT DURATION: 4 weeks  PLANNED INTERVENTIONS: 97164- PT Re-evaluation, 97110-Therapeutic exercises, 97530- Therapeutic activity, [id number]- Neuromuscular re-education, 97535- Self Care, 02859- Manual therapy, G0283- Electrical stimulation (unattended), [id number]- Electrical stimulation (manual), 20560 (1-2 muscles), 20561 (3+ muscles)- Dry Needling, Balance training, Cryotherapy, and Moist heat.  PLAN FOR NEXT SESSION: decompression activities, functional strengthening  4:56 PM, 10/01/23 Rosaria Settler, PT,  DPT [place] with [facility]

## 2023-10-06 ENCOUNTER — Ambulatory Visit (HOSPITAL_COMMUNITY)

## 2023-10-06 DIAGNOSIS — M6281 Muscle weakness (generalized): Secondary | ICD-10-CM | POA: Diagnosis not present

## 2023-10-06 DIAGNOSIS — M545 Low back pain, unspecified: Secondary | ICD-10-CM | POA: Diagnosis not present

## 2023-10-06 NOTE — Therapy (Signed)
 OUTPATIENT PHYSICAL THERAPY THORACOLUMBAR TREATMENT   Patient Name: Sheri Rice MRN: [id number] DOB:1956/02/22, 68 y.o., female Today's Date: 10/06/2023  END OF SESSION:  PT End of Session - 10/06/23 0809     Visit Number 3    Number of Visits 9    Date for PT Re-Evaluation 10/27/23    Authorization Type [employer]    Authorization Time Period 9 visits approved 7/1 to 8/15    Authorization - Visit Number 3    Authorization - Number of Visits 9    Progress Note Due on Visit 9    PT Start Time 0807    PT Stop Time 0845    PT Time Calculation (min) 38 min    Activity Tolerance Patient tolerated treatment well    Behavior During Therapy [facility] for tasks assessed/performed           Past Medical History:  Diagnosis Date   GERD (gastroesophageal reflux disease)    Hyperlipidemia    Hypothyroidism    Past Surgical History:  Procedure Laterality Date   BALLOON DILATION N/A 11/26/2021   Procedure: BALLOON DILATION;  Surgeon: Cindie Carlin POUR, DO;  Location: [facility];  Service: Endoscopy;  Laterality: N/A;   BIOPSY  11/26/2021   Procedure: BIOPSY;  Surgeon: Cindie Carlin POUR, DO;  Location: [facility];  Service: Endoscopy;;   CHONDROPLASTY Left 07/23/2021   Procedure: CHONDROPLASTY;  Surgeon: Margrette Taft BRAVO, MD;  Location: [facility];  Service: Orthopedics;  Laterality: Left;   COLONOSCOPY  06/2017   Dr. Norleen Kiang;  Small internal hemorrhoids, otherwise normal exam. Repeat in 10 years.   DILATION AND CURETTAGE OF UTERUS     ESOPHAGOGASTRODUODENOSCOPY  06/2017   Dr. Norleen Kiang; Normal exam   ESOPHAGOGASTRODUODENOSCOPY (EGD) WITH PROPOFOL  N/A 11/26/2021   Procedure: ESOPHAGOGASTRODUODENOSCOPY (EGD) WITH PROPOFOL ;  Surgeon: Cindie Carlin POUR, DO;  [facility]: [facility];  Service: Endoscopy;  Laterality: N/A;  10:30 am   KNEE ARTHROSCOPY WITH MEDIAL MENISECTOMY Left 07/23/2021   Procedure: KNEE ARTHROSCOPY WITH MEDIAL MENISECTOMY;  Surgeon: Margrette Taft BRAVO, MD;   Location: [facility];  Service: Orthopedics;  Laterality: Left;   Patient Active Problem List   Diagnosis Date Noted   Pain in multiple muscles 01/05/2023   Facial swelling 01/05/2023   Spider telangiectasia 12/09/2022   Knee pain, bilateral 04/22/2022   Viral illness 04/22/2022   Nocturnal leg cramps 01/14/2022   Left shoulder pain 01/14/2022   Dysphagia 11/20/2021   Chondromalacia patellae, left knee    Sore throat 07/16/2021   Rash and other nonspecific skin eruption 06/04/2021   Encounter for screening mammogram for malignant neoplasm of breast 04/08/2021   Post-menopausal 04/08/2021   Obesity (BMI 30-39.9) 04/08/2021   Hypothyroidism 12/18/2020   Chronic pain of left knee 12/18/2020   Globus sensation 04/06/2017   Gastroesophageal reflux disease 02/23/2017   Weakness 03/03/2016   Herpes zoster 09/25/2015   Urinary urgency 04/18/2015   DDD (degenerative disc disease), cervical 03/16/2015   Osteoarthritis of both knees 03/16/2015   COUGH 03/18/2010   HYPERLIPIDEMIA, MIXED, MILD 05/28/2007   ANAL OR RECTAL PAIN 05/28/2007   URINARY INCONTINENCE, MIXED 02/05/2007   RESTLESS LEG SYNDROME 12/08/2006   CHEST PAIN, ATYPICAL 12/08/2006   DYSMENORRHEA 12/05/2006   Headache 12/05/2006   Allergic rhinitis 12/04/2006   PLANTAR FASCIITIS, BILATERAL 10/05/2006   HOARSENESS 07/28/2006   FIBROIDS, UTERUS 03/31/2001    PCP: Zarwolo, Gloria, FNP  REFERRING PROVIDER: Brenna Lin, MD  REFERRING DIAG: M54.50 (ICD-10-CM) -  Lumbar pain   Rationale for Evaluation and Treatment: Rehabilitation  THERAPY DIAG:  Lumbar pain  Muscle weakness (generalized)  ONSET DATE: ~ 5 years  SUBJECTIVE:                                                                                                                                                                                           SUBJECTIVE STATEMENT: Some pain in her hips and her left knee pain today.  5/10 at most pain left  knee   EVAL: Pt reports pain that started in her right knee that has radiatd up to hip and back and now sometimes in her neck. Pt also reports burngin pain the middle of her knee, pointing to pes anserine area. Pt reports bony like pain. Pt's referring provider reporting that her pain is coming from her back and not her knees. Pt reports being active, goes for walks throughout the day. Pt reports walking and talking about her spiritual beliefs.  Onset of pain is waking up in the morning. Pain mainly in her legs.   PERTINENT HISTORY:    PAIN:  Are you having pain? Yes: NPRS scale: 6/10  worst: 9/10 Pain location: low back, knees, total body Pain description: Deep, achy Aggravating factors: waking up from sleeping Relieving factors: walking and movement   PRECAUTIONS: None  RED FLAGS: Bowel or bladder incontinence: No   WEIGHT BEARING RESTRICTIONS: No  FALLS:  Has patient fallen in last 6 months? No   PATIENT GOALS: Be able to sleep well.   NEXT MD VISIT: in 3 weeks  OBJECTIVE:  Note: Objective measures were completed at Evaluation unless otherwise noted.  DIAGNOSTIC FINDINGS:    PATIENT SURVEYS:  Modified Oswestry: 23 / 50 = 46.0 %   COGNITION: Overall cognitive status: Within functional limits for tasks assessed     SENSATION: Light touch: reduced light touch on LLE compared to RLE   POSTURE: increased lumbar lordosis and anterior pelvic tilt  PALPATION: Tender to palpate at right gluteal medius  LUMBAR ROM:   AROM eval  Flexion 100%  Extension 100% and painful in thoracic spine  Right lateral flexion 100% and painful  Left lateral flexion 100% and not painful  Right rotation 100% and painful in right QL  Left rotation 100%    (Blank rows = not tested)  LOWER EXTREMITY ROM:     Active  Right eval Left eval  Hip flexion    Hip extension    Hip abduction    Hip adduction    Hip internal rotation    Hip external rotation    Knee flexion  Knee  extension    Ankle dorsiflexion    Ankle plantarflexion    Ankle inversion    Ankle eversion     (Blank rows = not tested)  LOWER EXTREMITY MMT:    MMT Right eval Left eval  Hip flexion 3+ 3+  Hip extension 4- 3+  Hip abduction 4 4-  Hip adduction    Hip internal rotation    Hip external rotation    Knee flexion    Knee extension 5 4-  Ankle dorsiflexion    Ankle plantarflexion    Ankle inversion    Ankle eversion     (Blank rows = not tested)  LUMBAR SPECIAL TESTS:  Straight leg raise test: Positive, Slump test: Positive, and SI Compression/distraction test: Positive  FUNCTIONAL TESTS:  30 Second Chair Stand Test: 24x  Norms:   Age 48-64 86-69 5-74 75-79 80-84 85-89 90-94  Women 15 15 14 13 12 11 9   Men 17 16 15 14 13 11 9     : 643ft  GAIT: Distance walked: 69ft Assistive device utilized: None Level of assistance: Complete Independence Comments: long stride length  TREATMENT DATE:  10/06/23 Sit to stand x 10 Supine: LTR x 10 Bridge with 3 hold x 10 Clam with red theraband 2 x 10 each time Sciatic nerve glide 3 x 10 reps each side Seated: Piriformis stretch x 20 each side Hamstring stretch 3 x 20 each Discussion of decompression exercises   10/01/23: Review of goals and HEP LTR, 2x10 Supine Bridge, 2x10 Sciatic Nerve glide, 10x Clamshells, 12x Hamstring stretch, 2x30 Piriformis Stretch, 2x30 Sit to Stand, verbal cues for posture, 2x10  09/29/2023 PT Evaluation                LTR x 30 Supine bridge x 15 Supine nerve glide x 15                                                                                                                  PATIENT EDUCATION:  Education details: PT Evaluation, findings, prognosis, frequency, attendance policy, and sleeping position with pillow under neath knees and HEP. Person educated: Patient Education method: Medical illustrator Education comprehension: verbalized understanding  HOME  EXERCISE PROGRAM: Access Code: [email or web address] URL: [email or web address] Date: 09/29/2023 Prepared by: Omega Bottcher  Exercises - Supine Lower Trunk Rotation  - 1 x daily - 7 x weekly - 3 sets - 30 reps - Supine Bridge  - 1 x daily - 7 x weekly - 3 sets - 15 reps - Supine Sciatic Nerve Glide  - 1 x daily - 7 x weekly - 3 sets - 15-20 reps  Access Code: [email or web address] URL: [email or web address] Date: 10/01/2023 Prepared by: Rosaria Powell-Butler  Exercises - Clamshell  - 2 x daily - 7 x weekly - 2 sets - 10 reps - Seated Hamstring Stretch  - 2 x daily - 7 x weekly - 3 sets - 30 hold - Supine Piriformis Stretch with Foot on Ground  - 2 x daily -  7 x weekly - 3 sets - 30 hold - Sit to Stand  - 2 x daily - 7 x weekly - 2 sets - 10 reps  ASSESSMENT:  CLINICAL IMPRESSION: Patient states she missplaced her HEP so reprinted and issued and reviewed today.  Of note patient with complaints of tightness right side > left with hamstring/sciatic nerve glide and with piriformis stretch.  On further exam at the end of treatment patient presents with elevated right hip and slight curvature in the spine concave to the right upper and concave to the left lumbar spine.  Possible mild scoliosis.  Issued decompression exercises but did  not have time to go over so will go over next visit.  Patient will benefit from continued skilled therapy services to address deficits and promote return to optimal function.       Patient is a 68 y.o. female who was seen today for physical therapy evaluation and treatment for M54.50 (ICD-10-CM) - Lumbar pain. Pt demonstrates appropriate lumbar ROM and no particular painful patterns of movement but reduced muscle weakness in gluteal and hip musculature. Pt reports reduced activity tolerance and most of her pain is in the morning. Wide spread pain that affects neck and knees as well. Level of activity limitations indicate 2x for 4 weeks based upon objective  impairments and outcome measure. Pt will benefit from skilled Physical Therapy services to address deficits/limitations in order to improve functional and QOL.    OBJECTIVE IMPAIRMENTS: decreased activity tolerance, decreased strength, postural dysfunction, and pain.   ACTIVITY LIMITATIONS: carrying, lifting, bending, and stairs  PARTICIPATION LIMITATIONS: meal prep  PERSONAL FACTORS: Age are also affecting patient's functional outcome.   REHAB POTENTIAL: Good  CLINICAL DECISION MAKING: Stable/uncomplicated  EVALUATION COMPLEXITY: Low   GOALS: Goals reviewed with patient? Yes  SHORT TERM GOALS: Target date: 10/13/23  Pt will be independent with HEP in order to demonstrate participation in Physical Therapy POC.  Baseline: Goal status: INITIAL  2.  Pt will report 4/10 pain with mobility in order to demonstrate improved pain with ADLs.  Baseline:  Goal status: INITIAL  LONG TERM GOALS: Target date: 10/27/23  Pt will improve BLE MMT by at least 1/2 grade in order to demonstrate improved functional strength to return to desired activities.  Baseline: see objective.  Goal status: INITIAL  2.  Pt will improve 2 MWT by 1106ft in order to demonstrate improved functional ambulatory capacity in community setting.  Baseline: see objective.  Goal status: INITIAL  3.  Pt will improve Modified Oswestry score by 15% in order to demonstrate improved pain with functional goals and outcomes. Baseline: see objective.  Goal status: INITIAL  4.  Pt will report 2/10 pain with mobility in order to demonstrate reduced pain with ADLs lasting greater than 30 minutes.  Baseline: see objective.  Goal status: INITIAL  PLAN:  PT FREQUENCY: 2x/week  PT DURATION: 4 weeks  PLANNED INTERVENTIONS: 97164- PT Re-evaluation, 97110-Therapeutic exercises, 97530- Therapeutic activity, [id number]- Neuromuscular re-education, 97535- Self Care, [id number]- Manual therapy, [id number]- Electrical stimulation (unattended),  [id number]- Electrical stimulation (manual), 20560 (1-2 muscles), 20561 (3+ muscles)- Dry Needling, Balance training, Cryotherapy, and Moist heat.  PLAN FOR NEXT SESSION: decompression activities, functional strengthening 8:51 AM, 10/06/23 Sharona Rovner Small Amadea Keagy MPT [place] [facility] [place] [phone]

## 2023-10-13 ENCOUNTER — Ambulatory Visit (HOSPITAL_COMMUNITY): Admitting: Physical Therapy

## 2023-10-13 DIAGNOSIS — M545 Low back pain, unspecified: Secondary | ICD-10-CM | POA: Diagnosis not present

## 2023-10-13 DIAGNOSIS — M6281 Muscle weakness (generalized): Secondary | ICD-10-CM | POA: Diagnosis not present

## 2023-10-13 NOTE — Therapy (Signed)
 OUTPATIENT PHYSICAL THERAPY THORACOLUMBAR TREATMENT   Patient Name: Sheri Rice MRN: [id number] DOB:November 24, 1955, 68 y.o., female Today's Date: 10/13/2023  END OF SESSION:  PT End of Session - 10/13/23 0736     Visit Number 4    Number of Visits 9    Date for PT Re-Evaluation 10/27/23    Authorization Type [employer]    Authorization Time Period 9 visits approved 7/1 to 8/15    Authorization - Visit Number 4    Authorization - Number of Visits 9    Progress Note Due on Visit 9    PT Start Time 0730    PT Stop Time 0812    PT Time Calculation (min) 42 min    Activity Tolerance Patient tolerated treatment well    Behavior During Therapy WFL for tasks assessed/performed           Past Medical History:  Diagnosis Date   GERD (gastroesophageal reflux disease)    Hyperlipidemia    Hypothyroidism    Past Surgical History:  Procedure Laterality Date   BALLOON DILATION N/A 11/26/2021   Procedure: BALLOON DILATION;  Surgeon: Cindie Carlin POUR, DO;  Location: [facility];  Service: Endoscopy;  Laterality: N/A;   BIOPSY  11/26/2021   Procedure: BIOPSY;  Surgeon: Cindie Carlin POUR, DO;  Location: [facility];  Service: Endoscopy;;   CHONDROPLASTY Left 07/23/2021   Procedure: CHONDROPLASTY;  Surgeon: Margrette Taft BRAVO, MD;  Location: [facility];  Service: Orthopedics;  Laterality: Left;   COLONOSCOPY  06/2017   Dr. Norleen Kiang;  Small internal hemorrhoids, otherwise normal exam. Repeat in 10 years.   DILATION AND CURETTAGE OF UTERUS     ESOPHAGOGASTRODUODENOSCOPY  06/2017   Dr. Norleen Kiang; Normal exam   ESOPHAGOGASTRODUODENOSCOPY (EGD) WITH PROPOFOL  N/A 11/26/2021   Procedure: ESOPHAGOGASTRODUODENOSCOPY (EGD) WITH PROPOFOL ;  Surgeon: Cindie Carlin POUR, DO;  Location: [facility];  Service: Endoscopy;  Laterality: N/A;  10:30 am   KNEE ARTHROSCOPY WITH MEDIAL MENISECTOMY Left 07/23/2021   Procedure: KNEE ARTHROSCOPY WITH MEDIAL MENISECTOMY;  Surgeon: Margrette Taft BRAVO,  MD;  Location: [facility];  Service: Orthopedics;  Laterality: Left;   Patient Active Problem List   Diagnosis Date Noted   Pain in multiple muscles 01/05/2023   Facial swelling 01/05/2023   Spider telangiectasia 12/09/2022   Knee pain, bilateral 04/22/2022   Viral illness 04/22/2022   Nocturnal leg cramps 01/14/2022   Left shoulder pain 01/14/2022   Dysphagia 11/20/2021   Chondromalacia patellae, left knee    Sore throat 07/16/2021   Rash and other nonspecific skin eruption 06/04/2021   Encounter for screening mammogram for malignant neoplasm of breast 04/08/2021   Post-menopausal 04/08/2021   Obesity (BMI 30-39.9) 04/08/2021   Hypothyroidism 12/18/2020   Chronic pain of left knee 12/18/2020   Globus sensation 04/06/2017   Gastroesophageal reflux disease 02/23/2017   Weakness 03/03/2016   Herpes zoster 09/25/2015   Urinary urgency 04/18/2015   DDD (degenerative disc disease), cervical 03/16/2015   Osteoarthritis of both knees 03/16/2015   COUGH 03/18/2010   HYPERLIPIDEMIA, MIXED, MILD 05/28/2007   ANAL OR RECTAL PAIN 05/28/2007   URINARY INCONTINENCE, MIXED 02/05/2007   RESTLESS LEG SYNDROME 12/08/2006   CHEST PAIN, ATYPICAL 12/08/2006   DYSMENORRHEA 12/05/2006   Headache 12/05/2006   Allergic rhinitis 12/04/2006   PLANTAR FASCIITIS, BILATERAL 10/05/2006   HOARSENESS 07/28/2006   FIBROIDS, UTERUS 03/31/2001    PCP: Zarwolo, Gloria, FNP  REFERRING PROVIDER: Brenna Lin, MD  REFERRING DIAG: M54.50 (ICD-10-CM) -  Lumbar pain   Rationale for Evaluation and Treatment: Rehabilitation  THERAPY DIAG:  Lumbar pain  Muscle weakness (generalized)  ONSET DATE: ~ 5 years  SUBJECTIVE:                                                                                                                                                                                           SUBJECTIVE STATEMENT: Pt comes today with interpreter Marylouise) and was late for session.  Reports no real  pain in back just in Rt hip and bone pain all way down Lt LE.    EVAL: Pt reports pain that started in her right knee that has radiatd up to hip and back and now sometimes in her neck. Pt also reports burngin pain the middle of her knee, pointing to pes anserine area. Pt reports bony like pain. Pt's referring provider reporting that her pain is coming from her back and not her knees. Pt reports being active, goes for walks throughout the day. Pt reports walking and talking about her spiritual beliefs.  Onset of pain is waking up in the morning. Pain mainly in her legs.   PERTINENT HISTORY:    PAIN:  Are you having pain? Yes: NPRS scale: 6/10  worst: 9/10 Pain location: low back, knees, total body Pain description: Deep, achy Aggravating factors: waking up from sleeping Relieving factors: walking and movement   PRECAUTIONS: None  RED FLAGS: Bowel or bladder incontinence: No   WEIGHT BEARING RESTRICTIONS: No  FALLS:  Has patient fallen in last 6 months? No   PATIENT GOALS: Be able to sleep well.   NEXT MD VISIT: in 3 weeks  OBJECTIVE:  Note: Objective measures were completed at Evaluation unless otherwise noted.  DIAGNOSTIC FINDINGS:    PATIENT SURVEYS:  Modified Oswestry: 23 / 50 = 46.0 %   COGNITION: Overall cognitive status: Within functional limits for tasks assessed     SENSATION: Light touch: reduced light touch on LLE compared to RLE   POSTURE: increased lumbar lordosis and anterior pelvic tilt  PALPATION: Tender to palpate at right gluteal medius  LUMBAR ROM:   AROM eval  Flexion 100%  Extension 100% and painful in thoracic spine  Right lateral flexion 100% and painful  Left lateral flexion 100% and not painful  Right rotation 100% and painful in right QL  Left rotation 100%    (Blank rows = not tested)  LOWER EXTREMITY ROM:     Active  Right eval Left eval  Hip flexion    Hip extension    Hip abduction    Hip adduction    Hip internal  rotation    Hip external rotation    Knee flexion    Knee extension    Ankle dorsiflexion    Ankle plantarflexion    Ankle inversion    Ankle eversion     (Blank rows = not tested)  LOWER EXTREMITY MMT:    MMT Right eval Left eval  Hip flexion 3+ 3+  Hip extension 4- 3+  Hip abduction 4 4-  Hip adduction    Hip internal rotation    Hip external rotation    Knee flexion    Knee extension 5 4-  Ankle dorsiflexion    Ankle plantarflexion    Ankle inversion    Ankle eversion     (Blank rows = not tested)  LUMBAR SPECIAL TESTS:  Straight leg raise test: Positive, Slump test: Positive, and SI Compression/distraction test: Positive  FUNCTIONAL TESTS:  30 Second Chair Stand Test: 24x  Norms:   Age 26-64 62-69 70-74 75-79 80-84 85-89 90-94  Women 15 15 14 13 12 11 9   Men 17 16 15 14 13 11 9     : 665ft  GAIT: Distance walked: 642ft Assistive device utilized: None Level of assistance: Complete Independence Comments: long stride length  TREATMENT DATE:  10/13/23 Seated: piriformis stretch bil LE 2X30 Supine:  decompression 2-5 5X each 5 holds  Bridge 10X3 holds  Clam with RTB 10X3 holds  SLR 10X each (noted weakness bilaterally)  10/06/23 Sit to stand x 10 Supine: LTR x 10 Bridge with 3 hold x 10 Clam with red theraband 2 x 10 each time Sciatic nerve glide 3 x 10 reps each side Seated: Piriformis stretch x 20 each side Hamstring stretch 3 x 20 each Discussion of decompression exercises   10/01/23: Review of goals and HEP LTR, 2x10 Supine Bridge, 2x10 Sciatic Nerve glide, 10x Clamshells, 12x Hamstring stretch, 2x30 Piriformis Stretch, 2x30 Sit to Stand, verbal cues for posture, 2x10  09/29/2023 PT Evaluation                LTR x 30 Supine bridge x 15 Supine nerve glide x 15                                                                                                                  PATIENT EDUCATION:  Education details: PT  Evaluation, findings, prognosis, frequency, attendance policy, and sleeping position with pillow under neath knees and HEP. Person educated: Patient Education method: Medical illustrator Education comprehension: verbalized understanding  HOME EXERCISE PROGRAM: Access Code: [email or web address] URL: [email or web address] Date: 09/29/2023 Prepared by: Omega Bottcher Exercises - Supine Lower Trunk Rotation  - 1 x daily - 7 x weekly - 3 sets - 30 reps - Supine Bridge  - 1 x daily - 7 x weekly - 3 sets - 15 reps - Supine Sciatic Nerve Glide  - 1 x daily - 7 x weekly - 3 sets - 15-20 reps  Access Code: [email or web address] URL: [email or web address] Date: 10/01/2023 Prepared by: Rosaria Powell-Butler Exercises - Clamshell  - 2  x daily - 7 x weekly - 2 sets - 10 reps - Seated Hamstring Stretch  - 2 x daily - 7 x weekly - 3 sets - 30 hold - Supine Piriformis Stretch with Foot on Ground  - 2 x daily - 7 x weekly - 3 sets - 30 hold - Sit to Stand  - 2 x daily - 7 x weekly - 2 sets - 10 reps  Access Code: [email or web address] URL: [email or web address] Date: 10/13/2023 Prepared by: Greig Fuse (GIVEN ALL IN [facility]) Exercises - Supine Lower Trunk Rotation  - 1 x daily - 7 x weekly - 30 reps - Supine Bridge  - 1 x daily - 7 x weekly - 15 reps - Supine Sciatic Nerve Glide  - 1 x daily - 7 x weekly - 15-20 reps - Seated Piriformis Stretch with Trunk Bend  - 1 x daily - 7 x weekly - 4 reps - 30 sec hold - Supine Active Straight Leg Raise  - 1 x daily - 7 x weekly - 10 reps **decompression exericses 1-5, 5reps each; given in English  ASSESSMENT:  CLINICAL IMPRESSION: Patient arrived late so session limited today.  Began session with seated piriformis stretch with Rt LE being tighter than Lt.  Decompression exercises instructed with minimal cues for form.  Pt with some pain with leg stretch on Rt and reports of cramping with Rt leg press.  SLR added with noted weakness  bilaterally.  Updated HEP to include added SLR today and also reprinted for pt per request in spanish.  Reviewed these with pt as well as written decompression exercises that were not available in spanish.  Pt with no questions following. Patient will benefit from continued skilled therapy services to address deficits and promote return to optimal function.     Patient is a 68 y.o. female who was seen today for physical therapy evaluation and treatment for M54.50 (ICD-10-CM) - Lumbar pain. Pt demonstrates appropriate lumbar ROM and no particular painful patterns of movement but reduced muscle weakness in gluteal and hip musculature. Pt reports reduced activity tolerance and most of her pain is in the morning. Wide spread pain that affects neck and knees as well. Level of activity limitations indicate 2x for 4 weeks based upon objective impairments and outcome measure. Pt will benefit from skilled Physical Therapy services to address deficits/limitations in order to improve functional and QOL.    OBJECTIVE IMPAIRMENTS: decreased activity tolerance, decreased strength, postural dysfunction, and pain.   ACTIVITY LIMITATIONS: carrying, lifting, bending, and stairs  PARTICIPATION LIMITATIONS: meal prep  PERSONAL FACTORS: Age are also affecting patient's functional outcome.   REHAB POTENTIAL: Good  CLINICAL DECISION MAKING: Stable/uncomplicated  EVALUATION COMPLEXITY: Low   GOALS: Goals reviewed with patient? Yes  SHORT TERM GOALS: Target date: 10/13/23  Pt will be independent with HEP in order to demonstrate participation in Physical Therapy POC.  Baseline: Goal status: INITIAL  2.  Pt will report 4/10 pain with mobility in order to demonstrate improved pain with ADLs.  Baseline:  Goal status: INITIAL  LONG TERM GOALS: Target date: 10/27/23  Pt will improve BLE MMT by at least 1/2 grade in order to demonstrate improved functional strength to return to desired activities.  Baseline: see  objective.  Goal status: INITIAL  2.  Pt will improve 2 MWT by 136ft in order to demonstrate improved functional ambulatory capacity in community setting.  Baseline: see objective.  Goal status: INITIAL  3.  Pt will improve Modified Oswestry score  by 15% in order to demonstrate improved pain with functional goals and outcomes. Baseline: see objective.  Goal status: INITIAL  4.  Pt will report 2/10 pain with mobility in order to demonstrate reduced pain with ADLs lasting greater than 30 minutes.  Baseline: see objective.  Goal status: INITIAL  PLAN:  PT FREQUENCY: 2x/week  PT DURATION: 4 weeks  PLANNED INTERVENTIONS: 97164- PT Re-evaluation, 97110-Therapeutic exercises, 97530- Therapeutic activity, [id number]- Neuromuscular re-education, 97535- Self Care, [id number]- Manual therapy, [id number]- Electrical stimulation (unattended), [id number]- Electrical stimulation (manual), 20560 (1-2 muscles), 20561 (3+ muscles)- Dry Needling, Balance training, Cryotherapy, and Moist heat.  PLAN FOR NEXT SESSION: progress LE strength and core stabilization.   8:14 AM, 10/13/23 Greig KATHEE Fuse, PTA/CLT [facility] Ph: [phone]

## 2023-10-28 ENCOUNTER — Ambulatory Visit: Admitting: Orthopaedic Surgery

## 2023-10-28 ENCOUNTER — Ambulatory Visit (HOSPITAL_COMMUNITY)

## 2023-10-28 ENCOUNTER — Ambulatory Visit (INDEPENDENT_AMBULATORY_CARE_PROVIDER_SITE_OTHER): Payer: Self-pay

## 2023-10-28 ENCOUNTER — Encounter (HOSPITAL_COMMUNITY): Payer: Self-pay

## 2023-10-28 ENCOUNTER — Encounter: Payer: Self-pay | Admitting: Orthopaedic Surgery

## 2023-10-28 DIAGNOSIS — G8929 Other chronic pain: Secondary | ICD-10-CM

## 2023-10-28 DIAGNOSIS — M545 Low back pain, unspecified: Secondary | ICD-10-CM

## 2023-10-28 DIAGNOSIS — M25562 Pain in left knee: Secondary | ICD-10-CM

## 2023-10-28 DIAGNOSIS — M6281 Muscle weakness (generalized): Secondary | ICD-10-CM

## 2023-10-28 MED ORDER — METHYLPREDNISOLONE ACETATE 40 MG/ML IJ SUSP
40.0000 mg | Freq: Once | INTRAMUSCULAR | Status: AC
  Administered 2023-10-28: 40 mg via INTRA_ARTICULAR

## 2023-10-28 NOTE — Addendum Note (Signed)
 Addended by: MARCINE HUSBAND T on: 10/28/2023 04:11 PM   Modules accepted: Orders

## 2023-10-28 NOTE — Progress Notes (Signed)
 My back is better but my left knee hurts a lot  She has interpreter with her.  She has had increasing pain of the left knee with popping and some swelling and medial pain but no giving way.  She is going to PT for her back and that is much better.  She has no redness of the knee and no numbness.  Left knee has no effusion, some crepitus and ROM 0 to 110 with medial pain.  It is stable.  Gait is good.  NV intact.  No distal edema.  X-rays of the left knee was done, reported separately.  Encounter Diagnosis  Name Primary?   Chronic pain of left knee Yes   She cannot take NSAIDs.  I have offered injection and she would like to do that.  PROCEDURE NOTE:  The patient requests injections of the left knee , verbal consent was obtained.  The left knee was prepped appropriately after time out was performed.   Sterile technique was observed and injection of 1 cc of DepoMedrol 40 mg with several cc's of plain xylocaine . Anesthesia was provided by ethyl chloride and a 20-gauge needle was used to inject the knee area. The injection was tolerated well.  A band aid dressing was applied.  The patient was advised to apply ice later today and tomorrow to the injection sight as needed.  Return in three weeks.  Continue PT for the back.  Call if any problem.  Precautions discussed.  Electronically Signed Lemond Stable, MD 7/30/20253:17 PM

## 2023-10-28 NOTE — Therapy (Addendum)
 OUTPATIENT PHYSICAL THERAPY THORACOLUMBAR TREATMENT   Patient Name: Cleopha Indelicato MRN: [id number] DOB:10-05-1955, 68 y.o., female Today's Date: 10/28/2023  END OF SESSION:  PT End of Session - 10/28/23 1521     Visit Number 5    Number of Visits 9    Date for PT Re-Evaluation 11/13/23    Authorization Type [employer]    Authorization Time Period 9 visits approved 7/1 to 8/15    Authorization - Visit Number 5    Authorization - Number of Visits 9    Progress Note Due on Visit 9    PT Start Time 1523    PT Stop Time 1612    PT Time Calculation (min) 49 min    Activity Tolerance Patient tolerated treatment well    Behavior During Therapy WFL for tasks assessed/performed           Past Medical History:  Diagnosis Date   GERD (gastroesophageal reflux disease)    Hyperlipidemia    Hypothyroidism    Past Surgical History:  Procedure Laterality Date   BALLOON DILATION N/A 11/26/2021   Procedure: BALLOON DILATION;  Surgeon: Cindie Carlin POUR, DO;  Location: [facility];  Service: Endoscopy;  Laterality: N/A;   BIOPSY  11/26/2021   Procedure: BIOPSY;  Surgeon: Cindie Carlin POUR, DO;  Location: [facility];  Service: Endoscopy;;   CHONDROPLASTY Left 07/23/2021   Procedure: CHONDROPLASTY;  Surgeon: Margrette Taft BRAVO, MD;  Location: [facility];  Service: Orthopedics;  Laterality: Left;   COLONOSCOPY  06/2017   Dr. Norleen Kiang;  Small internal hemorrhoids, otherwise normal exam. Repeat in 10 years.   DILATION AND CURETTAGE OF UTERUS     ESOPHAGOGASTRODUODENOSCOPY  06/2017   Dr. Norleen Kiang; Normal exam   ESOPHAGOGASTRODUODENOSCOPY (EGD) WITH PROPOFOL  N/A 11/26/2021   Procedure: ESOPHAGOGASTRODUODENOSCOPY (EGD) WITH PROPOFOL ;  Surgeon: Cindie Carlin POUR, DO;  Location: [facility];  Service: Endoscopy;  Laterality: N/A;  10:30 am   KNEE ARTHROSCOPY WITH MEDIAL MENISECTOMY Left 07/23/2021   Procedure: KNEE ARTHROSCOPY WITH MEDIAL MENISECTOMY;  Surgeon: Margrette Taft BRAVO,  MD;  Location: [facility];  Service: Orthopedics;  Laterality: Left;   Patient Active Problem List   Diagnosis Date Noted   Pain in multiple muscles 01/05/2023   Facial swelling 01/05/2023   Spider telangiectasia 12/09/2022   Knee pain, bilateral 04/22/2022   Viral illness 04/22/2022   Nocturnal leg cramps 01/14/2022   Left shoulder pain 01/14/2022   Dysphagia 11/20/2021   Chondromalacia patellae, left knee    Sore throat 07/16/2021   Rash and other nonspecific skin eruption 06/04/2021   Encounter for screening mammogram for malignant neoplasm of breast 04/08/2021   Post-menopausal 04/08/2021   Obesity (BMI 30-39.9) 04/08/2021   Hypothyroidism 12/18/2020   Chronic pain of left knee 12/18/2020   Globus sensation 04/06/2017   Gastroesophageal reflux disease 02/23/2017   Weakness 03/03/2016   Herpes zoster 09/25/2015   Urinary urgency 04/18/2015   DDD (degenerative disc disease), cervical 03/16/2015   Osteoarthritis of both knees 03/16/2015   COUGH 03/18/2010   HYPERLIPIDEMIA, MIXED, MILD 05/28/2007   ANAL OR RECTAL PAIN 05/28/2007   URINARY INCONTINENCE, MIXED 02/05/2007   RESTLESS LEG SYNDROME 12/08/2006   CHEST PAIN, ATYPICAL 12/08/2006   DYSMENORRHEA 12/05/2006   Headache 12/05/2006   Allergic rhinitis 12/04/2006   PLANTAR FASCIITIS, BILATERAL 10/05/2006   HOARSENESS 07/28/2006   FIBROIDS, UTERUS 03/31/2001    PCP: Zarwolo, Gloria, FNP  REFERRING PROVIDER: Brenna Lin, MD  REFERRING DIAG: M54.50 (ICD-10-CM) -  Lumbar pain   Rationale for Evaluation and Treatment: Rehabilitation  THERAPY DIAG:  Lumbar pain  Muscle weakness (generalized)  ONSET DATE: ~ 5 years  SUBJECTIVE:                                                                                                                                                                                           SUBJECTIVE STATEMENT: Pt stated she is feeling much better today, feels improvements by 65%.  Interpreter  Marylouise) present this session.  Pain in neck is 4/10 and LBP is maybe a 2/10 today.  Reports pain increases the next day in both hips following.  Received shot in Lt knee earlier today.  EVAL: Pt reports pain that started in her right knee that has radiatd up to hip and back and now sometimes in her neck. Pt also reports burngin pain the middle of her knee, pointing to pes anserine area. Pt reports bony like pain. Pt's referring provider reporting that her pain is coming from her back and not her knees. Pt reports being active, goes for walks throughout the day. Pt reports walking and talking about her spiritual beliefs.  Onset of pain is waking up in the morning. Pain mainly in her legs.   PERTINENT HISTORY:    PAIN:  Are you having pain? Yes: NPRS scale: 6/10  worst: 9/10 Pain location: low back, knees, total body Pain description: Deep, achy Aggravating factors: waking up from sleeping Relieving factors: walking and movement   PRECAUTIONS: None  RED FLAGS: Bowel or bladder incontinence: No   WEIGHT BEARING RESTRICTIONS: No  FALLS:  Has patient fallen in last 6 months? No   PATIENT GOALS: Be able to sleep well.   NEXT MD VISIT: in 3 weeks  OBJECTIVE:  Note: Objective measures were completed at Evaluation unless otherwise noted.  DIAGNOSTIC FINDINGS:    PATIENT SURVEYS:  Modified Oswestry: 23 / 50 = 46.0 %  10/28/23:  Modified Oswestry Low Back Pain Disability Questionnaire: 6 / 50 = 12.0 %  COGNITION: Overall cognitive status: Within functional limits for tasks assessed     SENSATION: Light touch: reduced light touch on LLE compared to RLE   POSTURE: increased lumbar lordosis and anterior pelvic tilt  PALPATION: Tender to palpate at right gluteal medius  LUMBAR ROM:   AROM eval 10/28/23  Flexion 100% 100% pain free  Extension 100% and painful in thoracic spine 100% pain free  Right lateral flexion 100% and painful WNL painfree  Left lateral flexion 100% and  not painful WNL painfree  Right rotation 100% and painful in right QL WNL  painfree  Left  rotation 100%  WNL painfree   (Blank rows = not tested)  LOWER EXTREMITY ROM:     Active  Right eval Left eval  Hip flexion    Hip extension    Hip abduction    Hip adduction    Hip internal rotation    Hip external rotation    Knee flexion    Knee extension    Ankle dorsiflexion    Ankle plantarflexion    Ankle inversion    Ankle eversion     (Blank rows = not tested)  LOWER EXTREMITY MMT:    MMT Right eval Left eval Right 10/28/23: Left 10/28/23:  Hip flexion 3+ 3+ 4/5 3+  Hip extension 4- 3+ 4- 4/5  Hip abduction 4 4- 4 4+  Hip adduction      Hip internal rotation      Hip external rotation      Knee flexion   4+ 4+  Knee extension 5 4- 5 4+  Ankle dorsiflexion      Ankle plantarflexion      Ankle inversion      Ankle eversion       (Blank rows = not tested)  LUMBAR SPECIAL TESTS:  Straight leg raise test: Positive, Slump test: Positive, and SI Compression/distraction test: Positive  FUNCTIONAL TESTS:  30 Second Chair Stand Test: 24x   Norms:   Age 4-64 31-69 70-74 75-79 80-84 85-89 90-94  Women 15 15 14 13 12 11 9   Men 17 16 15 14 13 11 9     Eval:  : 63ft 10/28/23: 647ft no increased pain  GAIT: Distance walked: 664ft Assistive device utilized: None Level of assistance: Complete Independence Comments: long stride length  TREATMENT DATE:  10/28/23 677ft no increased pain Reviewed goals ROM MMT Modified Oswestry Low Back Pain Disability Questionnaire: 6 / 50 = 12.0 %  10/13/23 Seated: piriformis stretch bil LE 2X30 Supine:  decompression 2-5 5X each 5 holds  Bridge 10X3 holds  Clam with RTB 10X3 holds  SLR 10X each (noted weakness bilaterally)  10/06/23 Sit to stand x 10 Supine: LTR x 10 Bridge with 3 hold x 10 Clam with red theraband 2 x 10 each time Sciatic nerve glide 3 x 10 reps each side Seated: Piriformis stretch x  20 each side Hamstring stretch 3 x 20 each Discussion of decompression exercises   10/01/23: Review of goals and HEP LTR, 2x10 Supine Bridge, 2x10 Sciatic Nerve glide, 10x Clamshells, 12x Hamstring stretch, 2x30 Piriformis Stretch, 2x30 Sit to Stand, verbal cues for posture, 2x10  09/29/2023 PT Evaluation                LTR x 30 Supine bridge x 15 Supine nerve glide x 15                                                                                                                  PATIENT EDUCATION:  Education details: PT Evaluation, findings, prognosis, frequency, attendance policy, and sleeping position with  pillow under neath knees and HEP. Person educated: Patient Education method: Medical illustrator Education comprehension: verbalized understanding  HOME EXERCISE PROGRAM: Access Code: [email or web address] URL: [email or web address] Date: 09/29/2023 Prepared by: Omega Bottcher Exercises - Supine Lower Trunk Rotation  - 1 x daily - 7 x weekly - 3 sets - 30 reps - Supine Bridge  - 1 x daily - 7 x weekly - 3 sets - 15 reps - Supine Sciatic Nerve Glide  - 1 x daily - 7 x weekly - 3 sets - 15-20 reps  Access Code: [email or web address] URL: [email or web address] Date: 10/01/2023 Prepared by: Rosaria Powell-Butler Exercises - Clamshell  - 2 x daily - 7 x weekly - 2 sets - 10 reps - Seated Hamstring Stretch  - 2 x daily - 7 x weekly - 3 sets - 30 hold - Supine Piriformis Stretch with Foot on Ground  - 2 x daily - 7 x weekly - 3 sets - 30 hold - Sit to Stand  - 2 x daily - 7 x weekly - 2 sets - 10 reps  Access Code: [email or web address] URL: [email or web address] Date: 10/13/2023 Prepared by: Greig Fuse (GIVEN ALL IN [facility]) Exercises - Supine Lower Trunk Rotation  - 1 x daily - 7 x weekly - 30 reps - Supine Bridge  - 1 x daily - 7 x weekly - 15 reps - Supine Sciatic Nerve Glide  - 1 x daily - 7 x weekly - 15-20 reps - Seated Piriformis Stretch with  Trunk Bend  - 1 x daily - 7 x weekly - 4 reps - 30 sec hold - Supine Active Straight Leg Raise  - 1 x daily - 7 x weekly - 10 reps **decompression exericses 1-5, 5reps each; given in English  ASSESSMENT:  CLINICAL IMPRESSION: Reviewed goals with the following findings:  Pt has 1/2 STG and 1/4 LTGs.  Reports compliance with HEP daily though reports increased soreness in hips the next day.  ROM WNL painfree in all positions.  Continues to present with weakness in hips though are progressing.  Educated on soreness following exercises, typical to have some soreness following exercises due to weakness.  Improved self perceived functional ability with less pain noted with Oswestry survey.  Pt educated on importance of posture for neck pain control.  Pt will continue to benefits from PT to address goals unmet.  Patient is a 68 y.o. female who was seen today for physical therapy evaluation and treatment for M54.50 (ICD-10-CM) - Lumbar pain. Pt demonstrates appropriate lumbar ROM and no particular painful patterns of movement but reduced muscle weakness in gluteal and hip musculature. Pt reports reduced activity tolerance and most of her pain is in the morning. Wide spread pain that affects neck and knees as well. Level of activity limitations indicate 2x for 4 weeks based upon objective impairments and outcome measure. Pt will benefit from skilled Physical Therapy services to address deficits/limitations in order to improve functional and QOL.    OBJECTIVE IMPAIRMENTS: decreased activity tolerance, decreased strength, postural dysfunction, and pain.   ACTIVITY LIMITATIONS: carrying, lifting, bending, and stairs  PARTICIPATION LIMITATIONS: meal prep  PERSONAL FACTORS: Age are also affecting patient's functional outcome.   REHAB POTENTIAL: Good  CLINICAL DECISION MAKING: Stable/uncomplicated  EVALUATION COMPLEXITY: Low   GOALS: Goals reviewed with patient? Yes  SHORT TERM GOALS: Target date:  11/11/23  Pt will be independent with HEP in order to demonstrate participation in Physical Therapy POC.  Baseline:  10/28/23:  Reports compliance with HEP daily Goal  status: MET  2.  Pt will report 4/10 pain with mobility in order to demonstrate improved pain with ADLs.  Baseline: 10/28/23:  Average pain scale range for LBP 0-5/10, neck pain scale 5-6/10 Goal status: IN PROGRESS  LONG TERM GOALS: Target date: 11/13/23  Pt will improve BLE MMT by at least 1/2 grade in order to demonstrate improved functional strength to return to desired activities.  Baseline: see objective. Goal status: IN PROGRESS  2.  Pt will improve 2 MWT by 157ft in order to demonstrate improved functional ambulatory capacity in community setting.  Baseline: see objective. ; 10/28/23: improved by 40ft pain free Goal status: IN PROGRESS  3.  Pt will improve Modified Oswestry score by 15% in order to demonstrate improved pain with functional goals and outcomes. Baseline: see objective. ;10/28/23:  Modified Oswestry Low Back Pain Disability Questionnaire: 6 / 50 = 12.0 % (was: 23 / 50 = 46.0 %  Goal status: MET  4.  Pt will report 2/10 pain with mobility in order to demonstrate reduced pain with ADLs lasting greater than 30 minutes.  Baseline: see objective.; Average pain scale range for LBP 0-5/10, neck pain scale 5-6/10 Goal status: NOT MET  PLAN:  PT FREQUENCY: 2x/week  PT DURATION: 4 weeks  PLANNED INTERVENTIONS: 97164- PT Re-evaluation, 97110-Therapeutic exercises, 97530- Therapeutic activity, [id number]- Neuromuscular re-education, 97535- Self Care, [id number]- Manual therapy, [id number]- Electrical stimulation (unattended), [id number]- Electrical stimulation (manual), 20560 (1-2 muscles), 20561 (3+ muscles)- Dry Needling, Balance training, Cryotherapy, and Moist heat.  PLAN FOR NEXT SESSION: progress LE strength and core stabilization.    Augustin Mclean, LPTA/CLT; [facility] [phone]  4:28 PM, 10/28/23

## 2023-10-28 NOTE — Addendum Note (Signed)
 Addended by: LUDGER HEART E on: 10/28/2023 05:13 PM   Modules accepted: Orders

## 2023-10-30 ENCOUNTER — Encounter (HOSPITAL_COMMUNITY)

## 2023-11-03 ENCOUNTER — Ambulatory Visit (HOSPITAL_COMMUNITY): Attending: Orthopaedic Surgery

## 2023-11-03 ENCOUNTER — Encounter (HOSPITAL_COMMUNITY): Payer: Self-pay

## 2023-11-03 DIAGNOSIS — M545 Low back pain, unspecified: Secondary | ICD-10-CM | POA: Diagnosis not present

## 2023-11-03 DIAGNOSIS — M6281 Muscle weakness (generalized): Secondary | ICD-10-CM | POA: Insufficient documentation

## 2023-11-03 NOTE — Therapy (Signed)
 OUTPATIENT PHYSICAL THERAPY THORACOLUMBAR TREATMENT   Patient Name: Jacalyn Biggs MRN: [id number] DOB:December 10, 1955, 68 y.o., female Today's Date: 11/03/2023  END OF SESSION:  PT End of Session - 11/03/23 1529     Visit Number 6    Number of Visits 9    Date for PT Re-Evaluation 11/13/23    Authorization Type [employer]    Authorization Time Period 9 visits approved 7/1 to 8/15    Authorization - Visit Number 6    Authorization - Number of Visits 9    Progress Note Due on Visit 9    PT Start Time 1529   Late sign in   PT Stop Time 1600    PT Time Calculation (min) 31 min    Activity Tolerance Patient tolerated treatment well    Behavior During Therapy WFL for tasks assessed/performed           Past Medical History:  Diagnosis Date   GERD (gastroesophageal reflux disease)    Hyperlipidemia    Hypothyroidism    Past Surgical History:  Procedure Laterality Date   BALLOON DILATION N/A 11/26/2021   Procedure: BALLOON DILATION;  Surgeon: Cindie Carlin POUR, DO;  Location: [facility];  Service: Endoscopy;  Laterality: N/A;   BIOPSY  11/26/2021   Procedure: BIOPSY;  Surgeon: Cindie Carlin POUR, DO;  Location: [facility];  Service: Endoscopy;;   CHONDROPLASTY Left 07/23/2021   Procedure: CHONDROPLASTY;  Surgeon: Margrette Taft BRAVO, MD;  Location: [facility];  Service: Orthopedics;  Laterality: Left;   COLONOSCOPY  06/2017   Dr. Norleen Kiang;  Small internal hemorrhoids, otherwise normal exam. Repeat in 10 years.   DILATION AND CURETTAGE OF UTERUS     ESOPHAGOGASTRODUODENOSCOPY  06/2017   Dr. Norleen Kiang; Normal exam   ESOPHAGOGASTRODUODENOSCOPY (EGD) WITH PROPOFOL  N/A 11/26/2021   Procedure: ESOPHAGOGASTRODUODENOSCOPY (EGD) WITH PROPOFOL ;  Surgeon: Cindie Carlin POUR, DO;  Location: [facility];  Service: Endoscopy;  Laterality: N/A;  10:30 am   KNEE ARTHROSCOPY WITH MEDIAL MENISECTOMY Left 07/23/2021   Procedure: KNEE ARTHROSCOPY WITH MEDIAL MENISECTOMY;  Surgeon: Margrette Taft BRAVO, MD;  Location: [facility];  Service: Orthopedics;  Laterality: Left;   Patient Active Problem List   Diagnosis Date Noted   Pain in multiple muscles 01/05/2023   Facial swelling 01/05/2023   Spider telangiectasia 12/09/2022   Knee pain, bilateral 04/22/2022   Viral illness 04/22/2022   Nocturnal leg cramps 01/14/2022   Left shoulder pain 01/14/2022   Dysphagia 11/20/2021   Chondromalacia patellae, left knee    Sore throat 07/16/2021   Rash and other nonspecific skin eruption 06/04/2021   Encounter for screening mammogram for malignant neoplasm of breast 04/08/2021   Post-menopausal 04/08/2021   Obesity (BMI 30-39.9) 04/08/2021   Hypothyroidism 12/18/2020   Chronic pain of left knee 12/18/2020   Globus sensation 04/06/2017   Gastroesophageal reflux disease 02/23/2017   Weakness 03/03/2016   Herpes zoster 09/25/2015   Urinary urgency 04/18/2015   DDD (degenerative disc disease), cervical 03/16/2015   Osteoarthritis of both knees 03/16/2015   COUGH 03/18/2010   HYPERLIPIDEMIA, MIXED, MILD 05/28/2007   ANAL OR RECTAL PAIN 05/28/2007   URINARY INCONTINENCE, MIXED 02/05/2007   RESTLESS LEG SYNDROME 12/08/2006   CHEST PAIN, ATYPICAL 12/08/2006   DYSMENORRHEA 12/05/2006   Headache 12/05/2006   Allergic rhinitis 12/04/2006   PLANTAR FASCIITIS, BILATERAL 10/05/2006   HOARSENESS 07/28/2006   FIBROIDS, UTERUS 03/31/2001    PCP: Zarwolo, Gloria, FNP  REFERRING PROVIDER: Brenna Lin, MD  REFERRING  DIAG: M54.50 (ICD-10-CM) - Lumbar pain   Rationale for Evaluation and Treatment: Rehabilitation  THERAPY DIAG:  Lumbar pain  Muscle weakness (generalized)  ONSET DATE: ~ 5 years  SUBJECTIVE:                                                                                                                                                                                           SUBJECTIVE STATEMENT: Back is feeling okay, stated she woke up like a old lady this morning with  stiffness.  Burning and pain anterior shin with pain scale 7/10.  Interpreter Alonso) present this session.  EVAL: Pt reports pain that started in her right knee that has radiatd up to hip and back and now sometimes in her neck. Pt also reports burngin pain the middle of her knee, pointing to pes anserine area. Pt reports bony like pain. Pt's referring provider reporting that her pain is coming from her back and not her knees. Pt reports being active, goes for walks throughout the day. Pt reports walking and talking about her spiritual beliefs.  Onset of pain is waking up in the morning. Pain mainly in her legs.   PERTINENT HISTORY:    PAIN:  Are you having pain? Yes: NPRS scale: 7/10  worst: 9/10 Pain location: low back, knees, total body, burning in Lt knee Pain description: Deep, achy Aggravating factors: waking up from sleeping Relieving factors: walking and movement   PRECAUTIONS: None  RED FLAGS: Bowel or bladder incontinence: No   WEIGHT BEARING RESTRICTIONS: No  FALLS:  Has patient fallen in last 6 months? No   PATIENT GOALS: Be able to sleep well.   NEXT MD VISIT: in 3 weeks  OBJECTIVE:  Note: Objective measures were completed at Evaluation unless otherwise noted.  DIAGNOSTIC FINDINGS:    PATIENT SURVEYS:  Modified Oswestry: 23 / 50 = 46.0 %  10/28/23:  Modified Oswestry Low Back Pain Disability Questionnaire: 6 / 50 = 12.0 %  COGNITION: Overall cognitive status: Within functional limits for tasks assessed     SENSATION: Light touch: reduced light touch on LLE compared to RLE   POSTURE: increased lumbar lordosis and anterior pelvic tilt  PALPATION: Tender to palpate at right gluteal medius  LUMBAR ROM:   AROM eval 10/28/23  Flexion 100% 100% pain free  Extension 100% and painful in thoracic spine 100% pain free  Right lateral flexion 100% and painful WNL painfree  Left lateral flexion 100% and not painful WNL painfree  Right rotation 100% and painful  in right QL WNL  painfree  Left rotation 100%  WNL painfree   (Blank rows =  not tested)  LOWER EXTREMITY ROM:     Active  Right eval Left eval  Hip flexion    Hip extension    Hip abduction    Hip adduction    Hip internal rotation    Hip external rotation    Knee flexion    Knee extension    Ankle dorsiflexion    Ankle plantarflexion    Ankle inversion    Ankle eversion     (Blank rows = not tested)  LOWER EXTREMITY MMT:    MMT Right eval Left eval Right 10/28/23: Left 10/28/23:  Hip flexion 3+ 3+ 4/5 3+  Hip extension 4- 3+ 4- 4/5  Hip abduction 4 4- 4 4+  Hip adduction      Hip internal rotation      Hip external rotation      Knee flexion   4+ 4+  Knee extension 5 4- 5 4+  Ankle dorsiflexion      Ankle plantarflexion      Ankle inversion      Ankle eversion       (Blank rows = not tested)  LUMBAR SPECIAL TESTS:  Straight leg raise test: Positive, Slump test: Positive, and SI Compression/distraction test: Positive  FUNCTIONAL TESTS:  30 Second Chair Stand Test: 24x   Norms:   Age 27-64 53-69 70-74 75-79 80-84 85-89 90-94  Women 15 15 14 13 12 11 9   Men 17 16 15 14 13 11 9     Eval:  : 628ft 10/28/23: 627ft no increased pain  GAIT: Distance walked: 6105ft Assistive device utilized: None Level of assistance: Complete Independence Comments: long stride length  TREATMENT DATE:  11/03/23: Lumbar extension 10x Prone: Prone x POE x 1 min Press-up 5x 10  Supine:  Bridge 10 x3   Piriformis 1x 30 Standing:  3D hip excursion (squat front of mat then rotation) 10x each  10/28/23: 679ft no increased pain Reviewed goals ROM MMT Modified Oswestry Low Back Pain Disability Questionnaire: 6 / 50 = 12.0 %  10/13/23 Seated: piriformis stretch bil LE 2X30 Supine:  decompression 2-5 5X each 5 holds  Bridge 10X3 holds  Clam with RTB 10X3 holds  SLR 10X each (noted weakness bilaterally)  10/06/23 Sit to stand x  10 Supine: LTR x 10 Bridge with 3 hold x 10 Clam with red theraband 2 x 10 each time Sciatic nerve glide 3 x 10 reps each side Seated: Piriformis stretch x 20 each side Hamstring stretch 3 x 20 each Discussion of decompression exercises   10/01/23: Review of goals and HEP LTR, 2x10 Supine Bridge, 2x10 Sciatic Nerve glide, 10x Clamshells, 12x Hamstring stretch, 2x30 Piriformis Stretch, 2x30 Sit to Stand, verbal cues for posture, 2x10  09/29/2023 PT Evaluation                LTR x 30 Supine bridge x 15 Supine nerve glide x 15  PATIENT EDUCATION:  Education details: PT Evaluation, findings, prognosis, frequency, attendance policy, and sleeping position with pillow under neath knees and HEP. Person educated: Patient Education method: Medical illustrator Education comprehension: verbalized understanding  HOME EXERCISE PROGRAM: Access Code: [email or web address] URL: [email or web address] Date: 09/29/2023 Prepared by: Omega Bottcher Exercises - Supine Lower Trunk Rotation  - 1 x daily - 7 x weekly - 3 sets - 30 reps - Supine Bridge  - 1 x daily - 7 x weekly - 3 sets - 15 reps - Supine Sciatic Nerve Glide  - 1 x daily - 7 x weekly - 3 sets - 15-20 reps  Access Code: [email or web address] URL: [email or web address] Date: 10/01/2023 Prepared by: Rosaria Powell-Butler Exercises - Clamshell  - 2 x daily - 7 x weekly - 2 sets - 10 reps - Seated Hamstring Stretch  - 2 x daily - 7 x weekly - 3 sets - 30 hold - Supine Piriformis Stretch with Foot on Ground  - 2 x daily - 7 x weekly - 3 sets - 30 hold - Sit to Stand  - 2 x daily - 7 x weekly - 2 sets - 10 reps  Access Code: [email or web address] URL: [email or web address] Date: 10/13/2023 Prepared by: Greig Fuse (GIVEN ALL IN [facility]) Exercises - Supine Lower Trunk Rotation  - 1 x daily - 7 x weekly -  30 reps - Supine Bridge  - 1 x daily - 7 x weekly - 15 reps - Supine Sciatic Nerve Glide  - 1 x daily - 7 x weekly - 15-20 reps - Seated Piriformis Stretch with Trunk Bend  - 1 x daily - 7 x weekly - 4 reps - 30 sec hold - Supine Active Straight Leg Raise  - 1 x daily - 7 x weekly - 10 reps **decompression exericses 1-5, 5reps each; given in English  11/03/23: - Prone on Elbows Stretch  - 1 x daily - 7 x weekly - 3 sets - 10 reps - Prone Press Up  - 2 x daily - 7 x weekly - 1 sets - 10 reps - 10 hold - Standing Lumbar Extension  - 2 x daily - 7 x weekly - 1 sets - 10 reps - 5 hold - Supine Piriformis Stretch with Foot on Ground  - 2 x daily - 7 x weekly - 1 sets - 3 reps - 30 hold  ASSESSMENT:  CLINICAL IMPRESSION: Late arrival, limited session time.  Pt c/o burning anterior shin.  Began extension based exercises with reports of radicular symptoms resolved.  Added prone of elbows, pressup, standing lumbar extension and piriformis stretch to HEP with printout.  3D hip excursion with cueing to improve mechanics with squat and mobility.  No reports of lower back pain or radicular symptoms.   Patient is a 68 y.o. female who was seen today for physical therapy evaluation and treatment for M54.50 (ICD-10-CM) - Lumbar pain. Pt demonstrates appropriate lumbar ROM and no particular painful patterns of movement but reduced muscle weakness in gluteal and hip musculature. Pt reports reduced activity tolerance and most of her pain is in the morning. Wide spread pain that affects neck and knees as well. Level of activity limitations indicate 2x for 4 weeks based upon objective impairments and outcome measure. Pt will benefit from skilled Physical Therapy services to address deficits/limitations in order to improve functional and QOL.    OBJECTIVE IMPAIRMENTS: decreased activity tolerance, decreased strength, postural dysfunction, and pain.   ACTIVITY LIMITATIONS: carrying, lifting, bending, and  stairs  PARTICIPATION LIMITATIONS: meal prep  PERSONAL FACTORS: Age are also affecting patient's functional outcome.   REHAB POTENTIAL: Good  CLINICAL DECISION MAKING: Stable/uncomplicated  EVALUATION COMPLEXITY: Low   GOALS: Goals reviewed with patient? Yes  SHORT TERM GOALS: Target date: 11/11/23  Pt will be independent with HEP in order to demonstrate participation in Physical Therapy POC.  Baseline:  10/28/23:  Reports compliance with HEP daily Goal status: MET  2.  Pt will report 4/10 pain with mobility in order to demonstrate improved pain with ADLs.  Baseline: 10/28/23:  Average pain scale range for LBP 0-5/10, neck pain scale 5-6/10 Goal status: IN PROGRESS  LONG TERM GOALS: Target date: 11/13/23  Pt will improve BLE MMT by at least 1/2 grade in order to demonstrate improved functional strength to return to desired activities.  Baseline: see objective. Goal status: IN PROGRESS  2.  Pt will improve 2 MWT by 179ft in order to demonstrate improved functional ambulatory capacity in community setting.  Baseline: see objective. ; 10/28/23: improved by 88ft pain free Goal status: IN PROGRESS  3.  Pt will improve Modified Oswestry score by 15% in order to demonstrate improved pain with functional goals and outcomes. Baseline: see objective. ;10/28/23:  Modified Oswestry Low Back Pain Disability Questionnaire: 6 / 50 = 12.0 % (was: 23 / 50 = 46.0 %  Goal status: MET  4.  Pt will report 2/10 pain with mobility in order to demonstrate reduced pain with ADLs lasting greater than 30 minutes.  Baseline: see objective.; Average pain scale range for LBP 0-5/10, neck pain scale 5-6/10 Goal status: NOT MET  PLAN:  PT FREQUENCY: 2x/week  PT DURATION: 4 weeks  PLANNED INTERVENTIONS: 97164- PT Re-evaluation, 97110-Therapeutic exercises, 97530- Therapeutic activity, [id number]- Neuromuscular re-education, 97535- Self Care, [id number]- Manual therapy, [id number]- Electrical stimulation (unattended),  [id number]- Electrical stimulation (manual), 20560 (1-2 muscles), 20561 (3+ muscles)- Dry Needling, Balance training, Cryotherapy, and Moist heat.  PLAN FOR NEXT SESSION: progress LE strength and core stabilization.    Augustin Mclean, LPTA/CLT; CBIS [phone]  4:10 PM, 11/03/23

## 2023-11-05 ENCOUNTER — Ambulatory Visit (HOSPITAL_COMMUNITY)

## 2023-11-05 ENCOUNTER — Encounter (HOSPITAL_COMMUNITY): Payer: Self-pay

## 2023-11-05 DIAGNOSIS — M545 Low back pain, unspecified: Secondary | ICD-10-CM

## 2023-11-05 DIAGNOSIS — M6281 Muscle weakness (generalized): Secondary | ICD-10-CM | POA: Diagnosis not present

## 2023-11-05 NOTE — Therapy (Signed)
 OUTPATIENT PHYSICAL THERAPY THORACOLUMBAR TREATMENT   Patient Name: Sheri Rice MRN: [id number] DOB:02/21/1956, 68 y.o., female Today's Date: 11/05/2023  END OF SESSION:  PT End of Session - 11/05/23 1528     Visit Number 7    Number of Visits 9    Date for PT Re-Evaluation 11/13/23    Authorization Type [employer]    Authorization Time Period 9 visits approved 7/1 to 8/15    Authorization - Visit Number 7    Authorization - Number of Visits 9    Progress Note Due on Visit 9    PT Start Time 1528    PT Stop Time 1600    PT Time Calculation (min) 32 min    Activity Tolerance Patient tolerated treatment well    Behavior During Therapy WFL for tasks assessed/performed            Past Medical History:  Diagnosis Date   GERD (gastroesophageal reflux disease)    Hyperlipidemia    Hypothyroidism    Past Surgical History:  Procedure Laterality Date   BALLOON DILATION N/A 11/26/2021   Procedure: BALLOON DILATION;  Surgeon: Cindie Carlin POUR, DO;  Location: [facility];  Service: Endoscopy;  Laterality: N/A;   BIOPSY  11/26/2021   Procedure: BIOPSY;  Surgeon: Cindie Carlin POUR, DO;  Location: [facility];  Service: Endoscopy;;   CHONDROPLASTY Left 07/23/2021   Procedure: CHONDROPLASTY;  Surgeon: Margrette Taft BRAVO, MD;  Location: [facility];  Service: Orthopedics;  Laterality: Left;   COLONOSCOPY  06/2017   Dr. Norleen Kiang;  Small internal hemorrhoids, otherwise normal exam. Repeat in 10 years.   DILATION AND CURETTAGE OF UTERUS     ESOPHAGOGASTRODUODENOSCOPY  06/2017   Dr. Norleen Kiang; Normal exam   ESOPHAGOGASTRODUODENOSCOPY (EGD) WITH PROPOFOL  N/A 11/26/2021   Procedure: ESOPHAGOGASTRODUODENOSCOPY (EGD) WITH PROPOFOL ;  Surgeon: Cindie Carlin POUR, DO;  Location: [facility];  Service: Endoscopy;  Laterality: N/A;  10:30 am   KNEE ARTHROSCOPY WITH MEDIAL MENISECTOMY Left 07/23/2021   Procedure: KNEE ARTHROSCOPY WITH MEDIAL MENISECTOMY;  Surgeon: Margrette Taft BRAVO,  MD;  Location: [facility];  Service: Orthopedics;  Laterality: Left;   Patient Active Problem List   Diagnosis Date Noted   Pain in multiple muscles 01/05/2023   Facial swelling 01/05/2023   Spider telangiectasia 12/09/2022   Knee pain, bilateral 04/22/2022   Viral illness 04/22/2022   Nocturnal leg cramps 01/14/2022   Left shoulder pain 01/14/2022   Dysphagia 11/20/2021   Chondromalacia patellae, left knee    Sore throat 07/16/2021   Rash and other nonspecific skin eruption 06/04/2021   Encounter for screening mammogram for malignant neoplasm of breast 04/08/2021   Post-menopausal 04/08/2021   Obesity (BMI 30-39.9) 04/08/2021   Hypothyroidism 12/18/2020   Chronic pain of left knee 12/18/2020   Globus sensation 04/06/2017   Gastroesophageal reflux disease 02/23/2017   Weakness 03/03/2016   Herpes zoster 09/25/2015   Urinary urgency 04/18/2015   DDD (degenerative disc disease), cervical 03/16/2015   Osteoarthritis of both knees 03/16/2015   COUGH 03/18/2010   HYPERLIPIDEMIA, MIXED, MILD 05/28/2007   ANAL OR RECTAL PAIN 05/28/2007   URINARY INCONTINENCE, MIXED 02/05/2007   RESTLESS LEG SYNDROME 12/08/2006   CHEST PAIN, ATYPICAL 12/08/2006   DYSMENORRHEA 12/05/2006   Headache 12/05/2006   Allergic rhinitis 12/04/2006   PLANTAR FASCIITIS, BILATERAL 10/05/2006   HOARSENESS 07/28/2006   FIBROIDS, UTERUS 03/31/2001    PCP: Zarwolo, Gloria, FNP  REFERRING PROVIDER: Brenna Lin, MD  REFERRING DIAG: M54.50 (ICD-10-CM) -  Lumbar pain   Rationale for Evaluation and Treatment: Rehabilitation  THERAPY DIAG:  Lumbar pain  Muscle weakness (generalized)  ONSET DATE: ~ 5 years  SUBJECTIVE:                                                                                                                                                                                           SUBJECTIVE STATEMENT: Pt reports no back pain today, just a little pain in left knee. Pt states she feels  about 75% better since the start of physical therapy. 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  Interpreter Maryjane) present this session.  EVAL: Pt reports pain that started in her right knee that has radiatd up to hip and back and now sometimes in her neck. Pt also reports burngin pain the middle of her knee, pointing to pes anserine area. Pt reports bony like pain. Pt's referring provider reporting that her pain is coming from her back and not her knees. Pt reports being active, goes for walks throughout the day. Pt reports walking and talking about her spiritual beliefs.  Onset of pain is waking up in the morning. Pain mainly in her legs.   PERTINENT HISTORY:    PAIN:  Are you having pain? Yes: NPRS scale: 7/10  worst: 9/10 Pain location: low back, knees, total body, burning in Lt knee Pain description: Deep, achy Aggravating factors: waking up from sleeping Relieving factors: walking and movement   PRECAUTIONS: None  RED FLAGS: Bowel or bladder incontinence: No   WEIGHT BEARING RESTRICTIONS: No  FALLS:  Has patient fallen in last 6 months? No   PATIENT GOALS: Be able to sleep well.   NEXT MD VISIT: in 3 weeks  OBJECTIVE:  Note: Objective measures were completed at Evaluation unless otherwise noted.  DIAGNOSTIC FINDINGS:    PATIENT SURVEYS:  Modified Oswestry: 23 / 50 = 46.0 %  10/28/23:  Modified Oswestry Low Back Pain Disability Questionnaire: 6 / 50 = 12.0 %  COGNITION: Overall cognitive status: Within functional limits for tasks assessed     SENSATION: Light touch: reduced light touch on LLE compared to RLE   POSTURE: increased lumbar lordosis and anterior pelvic tilt  PALPATION: Tender to palpate at right gluteal medius  LUMBAR ROM:   AROM eval 10/28/23  Flexion 100% 100% pain free  Extension 100% and painful in thoracic spine 100% pain free  Right lateral flexion 100% and painful WNL painfree  Left lateral flexion 100% and not  painful WNL painfree  Right rotation 100% and painful in right QL WNL  painfree  Left rotation  100%  WNL painfree   (Blank rows = not tested)  LOWER EXTREMITY ROM:     Active  Right eval Left eval  Hip flexion    Hip extension    Hip abduction    Hip adduction    Hip internal rotation    Hip external rotation    Knee flexion    Knee extension    Ankle dorsiflexion    Ankle plantarflexion    Ankle inversion    Ankle eversion     (Blank rows = not tested)  LOWER EXTREMITY MMT:    MMT Right eval Left eval Right 10/28/23: Left 10/28/23:  Hip flexion 3+ 3+ 4/5 3+  Hip extension 4- 3+ 4- 4/5  Hip abduction 4 4- 4 4+  Hip adduction      Hip internal rotation      Hip external rotation      Knee flexion   4+ 4+  Knee extension 5 4- 5 4+  Ankle dorsiflexion      Ankle plantarflexion      Ankle inversion      Ankle eversion       (Blank rows = not tested)  LUMBAR SPECIAL TESTS:  Straight leg raise test: Positive, Slump test: Positive, and SI Compression/distraction test: Positive  FUNCTIONAL TESTS:  30 Second Chair Stand Test: 24x   Norms:   Age 33-64 68-69 70-74 75-79 80-84 85-89 90-94  Women 15 15 14 13 12 11 9   Men 17 16 15 14 13 11 9     Eval:  : 648ft 10/28/23: 643ft no increased pain  GAIT: Distance walked: 662ft Assistive device utilized: None Level of assistance: Complete Independence Comments: long stride length  TREATMENT DATE:  11/05/2023  Therapeutic Exercise: -Supine bridges 2 sets of 10 reps, 5 second holds, with RTB at knees, pt cued for max hip extension -Nustep, 5 minutes, level 3 resistance, pt avgs 90 SPM -Clamshells, RTB at knees, 2 sets of 10 reps bilaterally   Neuromuscular Re-education: -Bird dogs, 2 sets of 5 reps bilaterally, pt cued for controlled movement -Modified Crunch, 3 second holds, 1 sets of 10 reps -Side plank, 2 bouts 20 seconds bilaterally -Kettle bell swings 10 pounds, 2 sets of 10  reps   11/03/23: Lumbar extension 10x Prone: Prone x POE x 1 min Press-up 5x 10  Supine:  Bridge 10 x3   Piriformis 1x 30 Standing:  3D hip excursion (squat front of mat then rotation) 10x each  10/28/23: 655ft no increased pain Reviewed goals ROM MMT Modified Oswestry Low Back Pain Disability Questionnaire: 6 / 50 = 12.0 %                                                                                                              PATIENT EDUCATION:  Education details: PT Evaluation, findings, prognosis, frequency, attendance policy, and sleeping position with pillow under neath knees and HEP. Person educated: Patient Education method: Medical illustrator Education comprehension: verbalized understanding  HOME EXERCISE PROGRAM: Access Code:  [email or web address] URL: [email or web address] Date: 09/29/2023 Prepared by: Omega Bottcher Exercises - Supine Lower Trunk Rotation  - 1 x daily - 7 x weekly - 3 sets - 30 reps - Supine Bridge  - 1 x daily - 7 x weekly - 3 sets - 15 reps - Supine Sciatic Nerve Glide  - 1 x daily - 7 x weekly - 3 sets - 15-20 reps  Access Code: [email or web address] URL: [email or web address] Date: 10/01/2023 Prepared by: Rosaria Powell-Butler Exercises - Clamshell  - 2 x daily - 7 x weekly - 2 sets - 10 reps - Seated Hamstring Stretch  - 2 x daily - 7 x weekly - 3 sets - 30 hold - Supine Piriformis Stretch with Foot on Ground  - 2 x daily - 7 x weekly - 3 sets - 30 hold - Sit to Stand  - 2 x daily - 7 x weekly - 2 sets - 10 reps  Access Code: [email or web address] URL: [email or web address] Date: 10/13/2023 Prepared by: Greig Fuse (GIVEN ALL IN [facility]) Exercises - Supine Lower Trunk Rotation  - 1 x daily - 7 x weekly - 30 reps - Supine Bridge  - 1 x daily - 7 x weekly - 15 reps - Supine Sciatic Nerve Glide  - 1 x daily - 7 x weekly - 15-20 reps - Seated Piriformis Stretch with Trunk Bend  - 1 x daily - 7 x weekly  - 4 reps - 30 sec hold - Supine Active Straight Leg Raise  - 1 x daily - 7 x weekly - 10 reps **decompression exericses 1-5, 5reps each; given in English  11/03/23: - Prone on Elbows Stretch  - 1 x daily - 7 x weekly - 3 sets - 10 reps - Prone Press Up  - 2 x daily - 7 x weekly - 1 sets - 10 reps - 10 hold - Standing Lumbar Extension  - 2 x daily - 7 x weekly - 1 sets - 10 reps - 5 hold - Supine Piriformis Stretch with Foot on Ground  - 2 x daily - 7 x weekly - 1 sets - 3 reps - 30 hold  ASSESSMENT:  CLINICAL IMPRESSION: Late arrival, limited session time.  Patient continues to demonstrate decreased low back pain, decreased core/LE strength, decreased endurance and functional mobility. Patient able to progress dynamic balance and core activation exercises today with plank variations and kettle bell swings, good performance with verbal cueing. Patient would continue to benefit from skilled physical therapy for increased endurance with ambulation, increased core/LE strength, and improved functional mobility for improved quality of life, improved independence with management of low back pain and continued progress towards therapy goals.    Patient is a 68 y.o. female who was seen today for physical therapy evaluation and treatment for M54.50 (ICD-10-CM) - Lumbar pain. Pt demonstrates appropriate lumbar ROM and no particular painful patterns of movement but reduced muscle weakness in gluteal and hip musculature. Pt reports reduced activity tolerance and most of her pain is in the morning. Wide spread pain that affects neck and knees as well. Level of activity limitations indicate 2x for 4 weeks based upon objective impairments and outcome measure. Pt will benefit from skilled Physical Therapy services to address deficits/limitations in order to improve functional and QOL.    OBJECTIVE IMPAIRMENTS: decreased activity tolerance, decreased strength, postural dysfunction, and pain.   ACTIVITY  LIMITATIONS: carrying, lifting, bending, and stairs  PARTICIPATION LIMITATIONS: meal prep  PERSONAL FACTORS: Age are also affecting patient's functional outcome.  REHAB POTENTIAL: Good  CLINICAL DECISION MAKING: Stable/uncomplicated  EVALUATION COMPLEXITY: Low   GOALS: Goals reviewed with patient? Yes  SHORT TERM GOALS: Target date: 11/11/23  Pt will be independent with HEP in order to demonstrate participation in Physical Therapy POC.  Baseline:  10/28/23:  Reports compliance with HEP daily Goal status: MET  2.  Pt will report 4/10 pain with mobility in order to demonstrate improved pain with ADLs.  Baseline: 10/28/23:  Average pain scale range for LBP 0-5/10, neck pain scale 5-6/10 Goal status: IN PROGRESS  LONG TERM GOALS: Target date: 11/13/23  Pt will improve BLE MMT by at least 1/2 grade in order to demonstrate improved functional strength to return to desired activities.  Baseline: see objective. Goal status: IN PROGRESS  2.  Pt will improve 2 MWT by 172ft in order to demonstrate improved functional ambulatory capacity in community setting.  Baseline: see objective. ; 10/28/23: improved by 59ft pain free Goal status: IN PROGRESS  3.  Pt will improve Modified Oswestry score by 15% in order to demonstrate improved pain with functional goals and outcomes. Baseline: see objective. ;10/28/23:  Modified Oswestry Low Back Pain Disability Questionnaire: 6 / 50 = 12.0 % (was: 23 / 50 = 46.0 %  Goal status: MET  4.  Pt will report 2/10 pain with mobility in order to demonstrate reduced pain with ADLs lasting greater than 30 minutes.  Baseline: see objective.; Average pain scale range for LBP 0-5/10, neck pain scale 5-6/10 Goal status: NOT MET  PLAN:  PT FREQUENCY: 2x/week  PT DURATION: 4 weeks  PLANNED INTERVENTIONS: 97164- PT Re-evaluation, 97110-Therapeutic exercises, 97530- Therapeutic activity, [id number]- Neuromuscular re-education, 97535- Self Care, [id number]- Manual therapy,  [id number]- Electrical stimulation (unattended), [id number]- Electrical stimulation (manual), 20560 (1-2 muscles), 20561 (3+ muscles)- Dry Needling, Balance training, Cryotherapy, and Moist heat.  PLAN FOR NEXT SESSION: progress LE strength and core stabilization.    Lang Ada, PT, DPT [facility] Office: [phone] 4:04 PM, 11/05/23

## 2023-11-10 ENCOUNTER — Ambulatory Visit (HOSPITAL_COMMUNITY): Admitting: Physical Therapy

## 2023-11-10 DIAGNOSIS — M6281 Muscle weakness (generalized): Secondary | ICD-10-CM

## 2023-11-10 DIAGNOSIS — M545 Low back pain, unspecified: Secondary | ICD-10-CM

## 2023-11-10 NOTE — Therapy (Signed)
 OUTPATIENT PHYSICAL THERAPY THORACOLUMBAR TREATMENT   Patient Name: Sheri Rice MRN: [id number] DOB:24-Oct-1955, 68 y.o., female Today's Date: 11/10/2023  END OF SESSION:  PT End of Session - 11/10/23 1537     Visit Number 8    Number of Visits 9    Date for PT Re-Evaluation 11/13/23    Authorization Type [employer]    Authorization Time Period 9 visits approved 7/1 to 8/15    Authorization - Visit Number 8    Authorization - Number of Visits 9    Progress Note Due on Visit 9    PT Start Time 1528    PT Stop Time 1603    PT Time Calculation (min) 35 min    Activity Tolerance Patient tolerated treatment well    Behavior During Therapy WFL for tasks assessed/performed             Past Medical History:  Diagnosis Date   GERD (gastroesophageal reflux disease)    Hyperlipidemia    Hypothyroidism    Past Surgical History:  Procedure Laterality Date   BALLOON DILATION N/A 11/26/2021   Procedure: BALLOON DILATION;  Surgeon: Cindie Carlin POUR, DO;  Location: [facility];  Service: Endoscopy;  Laterality: N/A;   BIOPSY  11/26/2021   Procedure: BIOPSY;  Surgeon: Cindie Carlin POUR, DO;  Location: [facility];  Service: Endoscopy;;   CHONDROPLASTY Left 07/23/2021   Procedure: CHONDROPLASTY;  Surgeon: Margrette Taft BRAVO, MD;  Location: [facility];  Service: Orthopedics;  Laterality: Left;   COLONOSCOPY  06/2017   Dr. Norleen Kiang;  Small internal hemorrhoids, otherwise normal exam. Repeat in 10 years.   DILATION AND CURETTAGE OF UTERUS     ESOPHAGOGASTRODUODENOSCOPY  06/2017   Dr. Norleen Kiang; Normal exam   ESOPHAGOGASTRODUODENOSCOPY (EGD) WITH PROPOFOL  N/A 11/26/2021   Procedure: ESOPHAGOGASTRODUODENOSCOPY (EGD) WITH PROPOFOL ;  Surgeon: Cindie Carlin POUR, DO;  Location: [facility];  Service: Endoscopy;  Laterality: N/A;  10:30 am   KNEE ARTHROSCOPY WITH MEDIAL MENISECTOMY Left 07/23/2021   Procedure: KNEE ARTHROSCOPY WITH MEDIAL MENISECTOMY;  Surgeon: Margrette Taft BRAVO, MD;  Location: [facility];  Service: Orthopedics;  Laterality: Left;   Patient Active Problem List   Diagnosis Date Noted   Pain in multiple muscles 01/05/2023   Facial swelling 01/05/2023   Spider telangiectasia 12/09/2022   Knee pain, bilateral 04/22/2022   Viral illness 04/22/2022   Nocturnal leg cramps 01/14/2022   Left shoulder pain 01/14/2022   Dysphagia 11/20/2021   Chondromalacia patellae, left knee    Sore throat 07/16/2021   Rash and other nonspecific skin eruption 06/04/2021   Encounter for screening mammogram for malignant neoplasm of breast 04/08/2021   Post-menopausal 04/08/2021   Obesity (BMI 30-39.9) 04/08/2021   Hypothyroidism 12/18/2020   Chronic pain of left knee 12/18/2020   Globus sensation 04/06/2017   Gastroesophageal reflux disease 02/23/2017   Weakness 03/03/2016   Herpes zoster 09/25/2015   Urinary urgency 04/18/2015   DDD (degenerative disc disease), cervical 03/16/2015   Osteoarthritis of both knees 03/16/2015   COUGH 03/18/2010   HYPERLIPIDEMIA, MIXED, MILD 05/28/2007   ANAL OR RECTAL PAIN 05/28/2007   URINARY INCONTINENCE, MIXED 02/05/2007   RESTLESS LEG SYNDROME 12/08/2006   CHEST PAIN, ATYPICAL 12/08/2006   DYSMENORRHEA 12/05/2006   Headache 12/05/2006   Allergic rhinitis 12/04/2006   PLANTAR FASCIITIS, BILATERAL 10/05/2006   HOARSENESS 07/28/2006   FIBROIDS, UTERUS 03/31/2001    PCP: Zarwolo, Gloria, FNP  REFERRING PROVIDER: Brenna Lin, MD  REFERRING DIAG: M54.50 (  ICD-10-CM) - Lumbar pain   Rationale for Evaluation and Treatment: Rehabilitation  THERAPY DIAG:  Lumbar pain  Muscle weakness (generalized)  ONSET DATE: ~ 5 years  SUBJECTIVE:                                                                                                                                                                                           SUBJECTIVE STATEMENT: Pt arrived late for session today, comes with interpreter Marylouise).  Reports no  LBP only knee pain bilaterally with Lt hurting more at night, Rt more at day and currently.  5-6/10.   EVAL: Pt reports pain that started in her right knee that has radiatd up to hip and back and now sometimes in her neck. Pt also reports burngin pain the middle of her knee, pointing to pes anserine area. Pt reports bony like pain. Pt's referring provider reporting that her pain is coming from her back and not her knees. Pt reports being active, goes for walks throughout the day. Pt reports walking and talking about her spiritual beliefs.  Onset of pain is waking up in the morning. Pain mainly in her legs.   PERTINENT HISTORY:    PAIN:  Are you having pain? Yes: NPRS scale: 7/10  worst: 9/10 Pain location: low back, knees, total body, burning in Lt knee Pain description: Deep, achy Aggravating factors: waking up from sleeping Relieving factors: walking and movement   PRECAUTIONS: None  RED FLAGS: Bowel or bladder incontinence: No   WEIGHT BEARING RESTRICTIONS: No  FALLS:  Has patient fallen in last 6 months? No   PATIENT GOALS: Be able to sleep well.   NEXT MD VISIT: in 3 weeks  OBJECTIVE:  Note: Objective measures were completed at Evaluation unless otherwise noted.  DIAGNOSTIC FINDINGS:    PATIENT SURVEYS:  Modified Oswestry: 23 / 50 = 46.0 %  10/28/23:  Modified Oswestry Low Back Pain Disability Questionnaire: 6 / 50 = 12.0 %  COGNITION: Overall cognitive status: Within functional limits for tasks assessed     SENSATION: Light touch: reduced light touch on LLE compared to RLE   POSTURE: increased lumbar lordosis and anterior pelvic tilt  PALPATION: Tender to palpate at right gluteal medius  LUMBAR ROM:   AROM eval 10/28/23  Flexion 100% 100% pain free  Extension 100% and painful in thoracic spine 100% pain free  Right lateral flexion 100% and painful WNL painfree  Left lateral flexion 100% and not painful WNL painfree  Right rotation 100% and painful in  right QL WNL  painfree  Left rotation 100%  WNL painfree   (Blank rows = not  tested)  LOWER EXTREMITY ROM:     Active  Right eval Left eval  Hip flexion    Hip extension    Hip abduction    Hip adduction    Hip internal rotation    Hip external rotation    Knee flexion    Knee extension    Ankle dorsiflexion    Ankle plantarflexion    Ankle inversion    Ankle eversion     (Blank rows = not tested)  LOWER EXTREMITY MMT:    MMT Right eval Left eval Right 10/28/23: Left 10/28/23:  Hip flexion 3+ 3+ 4/5 3+  Hip extension 4- 3+ 4- 4/5  Hip abduction 4 4- 4 4+  Hip adduction      Hip internal rotation      Hip external rotation      Knee flexion   4+ 4+  Knee extension 5 4- 5 4+  Ankle dorsiflexion      Ankle plantarflexion      Ankle inversion      Ankle eversion       (Blank rows = not tested)  LUMBAR SPECIAL TESTS:  Straight leg raise test: Positive, Slump test: Positive, and SI Compression/distraction test: Positive  FUNCTIONAL TESTS:  30 Second Chair Stand Test: 24x   Norms:   Age 69-64 36-69 70-74 75-79 80-84 85-89 90-94  Women 15 15 14 13 12 11 9   Men 17 16 15 14 13 11 9     Eval:  : 629ft 10/28/23: 68ft no increased pain  GAIT: Distance walked: 68ft Assistive device utilized: None Level of assistance: Complete Independence Comments: long stride length  TREATMENT DATE:  11/10/23 Supine:  bridge 10X 5 holds  Bridge with hip abd using RTB 10X5 holds  Clamshells RTB 2X10 each LE   Straight leg raises 2X10 each LE  Modified crunches 2X10 with 3 holds Sidelying planks 3X10 holds each side Prone planks elbow and toes 3X10 POE X 1 minute hold Quadruped bird dogs 2X5 bilaterally with 3 holds Nustep at EOS 5 minutes level 3 resistance seat at 6 UE/LE    11/05/2023  Therapeutic Exercise: -Supine bridges 2 sets of 10 reps, 5 second holds, with RTB at knees, pt cued for max hip extension -Nustep, 5 minutes, level 3 resistance, pt  avgs 90 SPM -Clamshells, RTB at knees, 2 sets of 10 reps bilaterally   Neuromuscular Re-education: -Bird dogs, 2 sets of 5 reps bilaterally, pt cued for controlled movement -Modified Crunch, 3 second holds, 1 sets of 10 reps -Side plank, 2 bouts 20 seconds bilaterally -Kettle bell swings 10 pounds, 2 sets of 10 reps   11/03/23: Lumbar extension 10x Prone: Prone x POE x 1 min Press-up 5x 10  Supine:  Bridge 10 x3   Piriformis 1x 30 Standing:  3D hip excursion (squat front of mat then rotation) 10x each  10/28/23: 655ft no increased pain Reviewed goals ROM MMT Modified Oswestry Low Back Pain Disability Questionnaire: 6 / 50 = 12.0 %  PATIENT EDUCATION:  Education details: PT Evaluation, findings, prognosis, frequency, attendance policy, and sleeping position with pillow under neath knees and HEP. Person educated: Patient Education method: Medical illustrator Education comprehension: verbalized understanding  HOME EXERCISE PROGRAM: Access Code: [email or web address] URL: [email or web address] Date: 09/29/2023 Prepared by: Omega Bottcher Exercises - Supine Lower Trunk Rotation  - 1 x daily - 7 x weekly - 3 sets - 30 reps - Supine Bridge  - 1 x daily - 7 x weekly - 3 sets - 15 reps - Supine Sciatic Nerve Glide  - 1 x daily - 7 x weekly - 3 sets - 15-20 reps  Access Code: [email or web address] URL: [email or web address] Date: 10/01/2023 Prepared by: Rosaria Powell-Butler Exercises - Clamshell  - 2 x daily - 7 x weekly - 2 sets - 10 reps - Seated Hamstring Stretch  - 2 x daily - 7 x weekly - 3 sets - 30 hold - Supine Piriformis Stretch with Foot on Ground  - 2 x daily - 7 x weekly - 3 sets - 30 hold - Sit to Stand  - 2 x daily - 7 x weekly - 2 sets - 10 reps  Access Code: [email or web address] URL: [email or web address] Date:  10/13/2023 Prepared by: Greig Fuse (GIVEN ALL IN [facility]) Exercises - Supine Lower Trunk Rotation  - 1 x daily - 7 x weekly - 30 reps - Supine Bridge  - 1 x daily - 7 x weekly - 15 reps - Supine Sciatic Nerve Glide  - 1 x daily - 7 x weekly - 15-20 reps - Seated Piriformis Stretch with Trunk Bend  - 1 x daily - 7 x weekly - 4 reps - 30 sec hold - Supine Active Straight Leg Raise  - 1 x daily - 7 x weekly - 10 reps **decompression exericses 1-5, 5reps each; given in English  11/03/23: - Prone on Elbows Stretch  - 1 x daily - 7 x weekly - 3 sets - 10 reps - Prone Press Up  - 2 x daily - 7 x weekly - 1 sets - 10 reps - 10 hold - Standing Lumbar Extension  - 2 x daily - 7 x weekly - 1 sets - 10 reps - 5 hold - Supine Piriformis Stretch with Foot on Ground  - 2 x daily - 7 x weekly - 1 sets - 3 reps - 30 hold  ASSESSMENT:  CLINICAL IMPRESSION: Late arrival, limited session time.  Focus remains on core and LE strengthening.  Able to increase to 2 sets of most mat activities today.  Staight leg raise added demonstrating Lt LE weakness as compared to Rt LE.  Pt able to maintain plank in good form for 10 seconds in both sidelying and prone positions.  Quadruped bird dogs remain most challenging but also improving with stability and full hold time with exercise. Patient will continue to benefit from skilled physical therapy for increased endurance with ambulation, increased core/LE strength, and improved functional mobility for improved quality of life, improved independence with management of low back pain and continued progress towards therapy goals.    Patient is a 68 y.o. female who was seen today for physical therapy evaluation and treatment for M54.50 (ICD-10-CM) - Lumbar pain. Pt demonstrates appropriate lumbar ROM and no particular painful patterns of movement but reduced muscle weakness in gluteal and hip musculature. Pt reports reduced activity tolerance and most of her pain is in the  morning. Wide spread pain that affects neck and knees as well. Level of  activity limitations indicate 2x for 4 weeks based upon objective impairments and outcome measure. Pt will benefit from skilled Physical Therapy services to address deficits/limitations in order to improve functional and QOL.    OBJECTIVE IMPAIRMENTS: decreased activity tolerance, decreased strength, postural dysfunction, and pain.   ACTIVITY LIMITATIONS: carrying, lifting, bending, and stairs  PARTICIPATION LIMITATIONS: meal prep  PERSONAL FACTORS: Age are also affecting patient's functional outcome.   REHAB POTENTIAL: Good  CLINICAL DECISION MAKING: Stable/uncomplicated  EVALUATION COMPLEXITY: Low   GOALS: Goals reviewed with patient? Yes  SHORT TERM GOALS: Target date: 11/11/23  Pt will be independent with HEP in order to demonstrate participation in Physical Therapy POC.  Baseline:  10/28/23:  Reports compliance with HEP daily Goal status: MET  2.  Pt will report 4/10 pain with mobility in order to demonstrate improved pain with ADLs.  Baseline: 10/28/23:  Average pain scale range for LBP 0-5/10, neck pain scale 5-6/10 Goal status: IN PROGRESS  LONG TERM GOALS: Target date: 11/13/23  Pt will improve BLE MMT by at least 1/2 grade in order to demonstrate improved functional strength to return to desired activities.  Baseline: see objective. Goal status: IN PROGRESS  2.  Pt will improve 2 MWT by 175ft in order to demonstrate improved functional ambulatory capacity in community setting.  Baseline: see objective. ; 10/28/23: improved by 27ft pain free Goal status: IN PROGRESS  3.  Pt will improve Modified Oswestry score by 15% in order to demonstrate improved pain with functional goals and outcomes. Baseline: see objective. ;10/28/23:  Modified Oswestry Low Back Pain Disability Questionnaire: 6 / 50 = 12.0 % (was: 23 / 50 = 46.0 %  Goal status: MET  4.  Pt will report 2/10 pain with mobility in order to  demonstrate reduced pain with ADLs lasting greater than 30 minutes.  Baseline: see objective.; Average pain scale range for LBP 0-5/10, neck pain scale 5-6/10 Goal status: NOT MET  PLAN:  PT FREQUENCY: 2x/week  PT DURATION: 4 weeks  PLANNED INTERVENTIONS: 97164- PT Re-evaluation, 97110-Therapeutic exercises, 97530- Therapeutic activity, [id number]- Neuromuscular re-education, 97535- Self Care, [id number]- Manual therapy, [id number]- Electrical stimulation (unattended), [id number]- Electrical stimulation (manual), 20560 (1-2 muscles), 20561 (3+ muscles)- Dry Needling, Balance training, Cryotherapy, and Moist heat.  PLAN FOR NEXT SESSION: progress LE strength and core stabilization. Complete PN next session.    Greig KATHEE Fuse, PTA/CLT [facility] Ph: [phone] 4:03 PM, 11/10/23

## 2023-11-12 ENCOUNTER — Ambulatory Visit (HOSPITAL_COMMUNITY)

## 2023-11-12 ENCOUNTER — Encounter (HOSPITAL_COMMUNITY): Payer: Self-pay

## 2023-11-12 DIAGNOSIS — M6281 Muscle weakness (generalized): Secondary | ICD-10-CM

## 2023-11-12 DIAGNOSIS — M545 Low back pain, unspecified: Secondary | ICD-10-CM | POA: Diagnosis not present

## 2023-11-12 NOTE — Therapy (Signed)
 OUTPATIENT PHYSICAL THERAPY THORACOLUMBAR TREATMENT/DISCHARGE PHYSICAL THERAPY DISCHARGE SUMMARY  Visits from Start of Care: 8  Current functional level related to goals / functional outcomes: Lacking   Remaining deficits: , strength   Education / Equipment: Pt educated on importance of attempting HEP at home before asking for referral for knees/hips.    Patient agrees to discharge. Patient goals were partially met. Patient is being discharged due to being pleased with the current functional level.   Patient Name: Sheri Rice MRN: [id number] DOB:Mar 11, 1956, 68 y.o., female Today's Date: 11/12/2023  END OF SESSION:  PT End of Session - 11/12/23 1529     Visit Number 9    Number of Visits 9    Date for PT Re-Evaluation 11/13/23    Authorization Type [employer]    Authorization Time Period 9 visits approved 7/1 to 8/15    Authorization - Visit Number 9    Authorization - Number of Visits 9    Progress Note Due on Visit 9    PT Start Time 1530    PT Stop Time 1552    PT Time Calculation (min) 22 min    Activity Tolerance Patient tolerated treatment well    Behavior During Therapy WFL for tasks assessed/performed             Past Medical History:  Diagnosis Date   GERD (gastroesophageal reflux disease)    Hyperlipidemia    Hypothyroidism    Past Surgical History:  Procedure Laterality Date   BALLOON DILATION N/A 11/26/2021   Procedure: BALLOON DILATION;  Surgeon: Cindie Carlin POUR, DO;  Location: [facility];  Service: Endoscopy;  Laterality: N/A;   BIOPSY  11/26/2021   Procedure: BIOPSY;  Surgeon: Cindie Carlin POUR, DO;  Location: [facility];  Service: Endoscopy;;   CHONDROPLASTY Left 07/23/2021   Procedure: CHONDROPLASTY;  Surgeon: Margrette Taft BRAVO, MD;  Location: [facility];  Service: Orthopedics;  Laterality: Left;   COLONOSCOPY  06/2017   Dr. Norleen Kiang;  Small internal hemorrhoids, otherwise normal exam. Repeat in 10 years.   DILATION AND  CURETTAGE OF UTERUS     ESOPHAGOGASTRODUODENOSCOPY  06/2017   Dr. Norleen Kiang; Normal exam   ESOPHAGOGASTRODUODENOSCOPY (EGD) WITH PROPOFOL  N/A 11/26/2021   Procedure: ESOPHAGOGASTRODUODENOSCOPY (EGD) WITH PROPOFOL ;  Surgeon: Cindie Carlin POUR, DO;  Location: [facility];  Service: Endoscopy;  Laterality: N/A;  10:30 am   KNEE ARTHROSCOPY WITH MEDIAL MENISECTOMY Left 07/23/2021   Procedure: KNEE ARTHROSCOPY WITH MEDIAL MENISECTOMY;  Surgeon: Margrette Taft BRAVO, MD;  Location: [facility];  Service: Orthopedics;  Laterality: Left;   Patient Active Problem List   Diagnosis Date Noted   Pain in multiple muscles 01/05/2023   Facial swelling 01/05/2023   Spider telangiectasia 12/09/2022   Knee pain, bilateral 04/22/2022   Viral illness 04/22/2022   Nocturnal leg cramps 01/14/2022   Left shoulder pain 01/14/2022   Dysphagia 11/20/2021   Chondromalacia patellae, left knee    Sore throat 07/16/2021   Rash and other nonspecific skin eruption 06/04/2021   Encounter for screening mammogram for malignant neoplasm of breast 04/08/2021   Post-menopausal 04/08/2021   Obesity (BMI 30-39.9) 04/08/2021   Hypothyroidism 12/18/2020   Chronic pain of left knee 12/18/2020   Globus sensation 04/06/2017   Gastroesophageal reflux disease 02/23/2017   Weakness 03/03/2016   Herpes zoster 09/25/2015   Urinary urgency 04/18/2015   DDD (degenerative disc disease), cervical 03/16/2015   Osteoarthritis of both knees 03/16/2015   COUGH 03/18/2010   HYPERLIPIDEMIA,  MIXED, MILD 05/28/2007   ANAL OR RECTAL PAIN 05/28/2007   URINARY INCONTINENCE, MIXED 02/05/2007   RESTLESS LEG SYNDROME 12/08/2006   CHEST PAIN, ATYPICAL 12/08/2006   DYSMENORRHEA 12/05/2006   Headache 12/05/2006   Allergic rhinitis 12/04/2006   PLANTAR FASCIITIS, BILATERAL 10/05/2006   HOARSENESS 07/28/2006   FIBROIDS, UTERUS 03/31/2001    PCP: Zarwolo, Gloria, FNP  REFERRING PROVIDER: Brenna Lin, MD  REFERRING DIAG: M54.50 (ICD-10-CM)  - Lumbar pain   Rationale for Evaluation and Treatment: Rehabilitation  THERAPY DIAG:  Lumbar pain  Muscle weakness (generalized)  ONSET DATE: ~ 5 years  SUBJECTIVE:                                                                                                                                                                                           SUBJECTIVE STATEMENT: Pt arrived late for session today, comes with interpreter.  Reports no LBP only knee pain bilaterally. Pt states she is able to complete all ADLs with out back pain which is a big improvement.   EVAL: Pt reports pain that started in her right knee that has radiatd up to hip and back and now sometimes in her neck. Pt also reports burngin pain the middle of her knee, pointing to pes anserine area. Pt reports bony like pain. Pt's referring provider reporting that her pain is coming from her back and not her knees. Pt reports being active, goes for walks throughout the day. Pt reports walking and talking about her spiritual beliefs.  Onset of pain is waking up in the morning. Pain mainly in her legs.   PERTINENT HISTORY:    PAIN:  Are you having pain? Yes: NPRS scale: 7/10  worst: 9/10 Pain location: low back, knees, total body, burning in Lt knee Pain description: Deep, achy Aggravating factors: waking up from sleeping Relieving factors: walking and movement   PRECAUTIONS: None  RED FLAGS: Bowel or bladder incontinence: No   WEIGHT BEARING RESTRICTIONS: No  FALLS:  Has patient fallen in last 6 months? No   PATIENT GOALS: Be able to sleep well.   NEXT MD VISIT: in 3 weeks  OBJECTIVE:  Note: Objective measures were completed at Evaluation unless otherwise noted.  DIAGNOSTIC FINDINGS:    PATIENT SURVEYS:  Modified Oswestry: 23 / 50 = 46.0 %  10/28/23:  Modified Oswestry Low Back Pain Disability Questionnaire: 6 / 50 = 12.0 %  COGNITION: Overall cognitive status: Within functional limits for tasks  assessed     SENSATION: Light touch: reduced light touch on LLE compared to RLE   POSTURE: increased lumbar lordosis and anterior pelvic tilt  PALPATION:  Tender to palpate at right gluteal medius  LUMBAR ROM:   AROM eval 10/28/23  Flexion 100% 100% pain free  Extension 100% and painful in thoracic spine 100% pain free  Right lateral flexion 100% and painful WNL painfree  Left lateral flexion 100% and not painful WNL painfree  Right rotation 100% and painful in right QL WNL  painfree  Left rotation 100%  WNL painfree   (Blank rows = not tested)  LOWER EXTREMITY ROM:     Active  Right eval Left eval  Hip flexion    Hip extension    Hip abduction    Hip adduction    Hip internal rotation    Hip external rotation    Knee flexion    Knee extension    Ankle dorsiflexion    Ankle plantarflexion    Ankle inversion    Ankle eversion     (Blank rows = not tested)  LOWER EXTREMITY MMT:    MMT Right eval Left eval Right 10/28/23: Left 10/28/23:  Hip flexion 3+ 3+ 4/5 3+  Hip extension 4- 3+ 4- 4/5  Hip abduction 4 4- 4 4+  Hip adduction      Hip internal rotation      Hip external rotation      Knee flexion   4+ 4+  Knee extension 5 4- 5 4+  Ankle dorsiflexion      Ankle plantarflexion      Ankle inversion      Ankle eversion       (Blank rows = not tested)  LUMBAR SPECIAL TESTS:  Straight leg raise test: Positive, Slump test: Positive, and SI Compression/distraction test: Positive  FUNCTIONAL TESTS:  30 Second Chair Stand Test: 24x 11/12/23: 16x   Norms:   Age 71-64 65-69 70-74 75-79 80-84 85-89 90-94  Women 15 15 14 13 12 11 9   Men 17 16 15 14 13 11 9     Eval:  : 663ft 10/28/23: 692ft no increased pain 11/12/23: 2MWT:648ft GAIT: Distance walked: 690ft Assistive device utilized: None Level of assistance: Complete Independence Comments: long stride length  TREATMENT DATE:  11/12/2023  Discharge note: 2MWT(observed increase in edurance  just short of meeting goal), 5TSTS(for tracking low back pain during functional movement, increased speed observed with no increased low back pain), HEP reviewed added to, education.    11/10/23 Supine:  bridge 10X 5 holds  Bridge with hip abd using RTB 10X5 holds  Clamshells RTB 2X10 each LE   Straight leg raises 2X10 each LE  Modified crunches 2X10 with 3 holds Sidelying planks 3X10 holds each side Prone planks elbow and toes 3X10 POE X 1 minute hold Quadruped bird dogs 2X5 bilaterally with 3 holds Nustep at EOS 5 minutes level 3 resistance seat at 6 UE/LE    11/05/2023  Therapeutic Exercise: -Supine bridges 2 sets of 10 reps, 5 second holds, with RTB at knees, pt cued for max hip extension -Nustep, 5 minutes, level 3 resistance, pt avgs 90 SPM -Clamshells, RTB at knees, 2 sets of 10 reps bilaterally   Neuromuscular Re-education: -Bird dogs, 2 sets of 5 reps bilaterally, pt cued for controlled movement -Modified Crunch, 3 second holds, 1 sets of 10 reps -Side plank, 2 bouts 20 seconds bilaterally -Kettle bell swings 10 pounds, 2 sets of 10 reps    PATIENT EDUCATION:  Education details: PT Evaluation, findings, prognosis, frequency, attendance policy, and sleeping position with pillow under neath knees and HEP. Person educated: Patient Education method:  Explanation and Demonstration Education comprehension: verbalized understanding  HOME EXERCISE PROGRAM: Access Code: [email or web address] URL: [email or web address] Date: 09/29/2023 Prepared by: Omega Bottcher Exercises - Supine Lower Trunk Rotation  - 1 x daily - 7 x weekly - 3 sets - 30 reps - Supine Bridge  - 1 x daily - 7 x weekly - 3 sets - 15 reps - Supine Sciatic Nerve Glide  - 1 x daily - 7 x weekly - 3 sets - 15-20 reps  Access Code: [email or web address] URL: [email or web address] Date: 10/01/2023 Prepared by: Rosaria Powell-Butler Exercises - Clamshell  - 2 x daily - 7 x weekly - 2 sets - 10 reps -  Seated Hamstring Stretch  - 2 x daily - 7 x weekly - 3 sets - 30 hold - Supine Piriformis Stretch with Foot on Ground  - 2 x daily - 7 x weekly - 3 sets - 30 hold - Sit to Stand  - 2 x daily - 7 x weekly - 2 sets - 10 reps  Access Code: [email or web address] URL: [email or web address] Date: 10/13/2023 Prepared by: Greig Fuse (GIVEN ALL IN [facility]) Exercises - Supine Lower Trunk Rotation  - 1 x daily - 7 x weekly - 30 reps - Supine Bridge  - 1 x daily - 7 x weekly - 15 reps - Supine Sciatic Nerve Glide  - 1 x daily - 7 x weekly - 15-20 reps - Seated Piriformis Stretch with Trunk Bend  - 1 x daily - 7 x weekly - 4 reps - 30 sec hold - Supine Active Straight Leg Raise  - 1 x daily - 7 x weekly - 10 reps **decompression exericses 1-5, 5reps each; given in English  11/03/23: - Prone on Elbows Stretch  - 1 x daily - 7 x weekly - 3 sets - 10 reps - Prone Press Up  - 2 x daily - 7 x weekly - 1 sets - 10 reps - 10 hold - Standing Lumbar Extension  - 2 x daily - 7 x weekly - 1 sets - 10 reps - 5 hold - Supine Piriformis Stretch with Foot on Ground  - 2 x daily - 7 x weekly - 1 sets - 3 reps - 30 hold Access Code: [email or web address] URL: [email or web address] Date: 11/12/2023 Prepared by: Satina Jerrell  Exercises - Kettlebell Swing  - 1 x daily - 7 x weekly - 3 sets - 10 reps - Goblet Squat with Kettlebell  - 1 x daily - 7 x weekly - 3 sets - 10 reps - Side Plank on Elbow  - 1 x daily - 7 x weekly - 3 sets - 10 reps - Modified Side Plank with Hip Abduction  - 1 x daily - 7 x weekly - 3 sets - 10 reps - Bird Dog  - 1 x daily - 7 x weekly - 3 sets - 10 reps - Side Plank with Clam  - 1 x daily - 7 x weekly - 3 sets - 10 reps ASSESSMENT:  CLINICAL IMPRESSION: Late arrival, limited session time.  Patient continues to demonstrate decreased low back pain, improved core/LE strength, improved gait quality and balance. Patient also demonstrates increased endurance with aerobic based exercise  during today's session. Patient educated on the importance of HEP compliance, increased physical activity and process of getting referral for LE therapy should HEP not help after a month or two. Pt to be discharged this date to independent HEP.     OBJECTIVE IMPAIRMENTS: decreased activity tolerance, decreased strength, postural dysfunction,  and pain.   ACTIVITY LIMITATIONS: carrying, lifting, bending, and stairs  PARTICIPATION LIMITATIONS: meal prep  PERSONAL FACTORS: Age are also affecting patient's functional outcome.   REHAB POTENTIAL: Good  CLINICAL DECISION MAKING: Stable/uncomplicated  EVALUATION COMPLEXITY: Low   GOALS: Goals reviewed with patient? Yes  SHORT TERM GOALS: Target date: 11/11/23  Pt will be independent with HEP in order to demonstrate participation in Physical Therapy POC.  Baseline:  10/28/23:  Reports compliance with HEP daily Goal status: MET  2.  Pt will report 4/10 pain with mobility in order to demonstrate improved pain with ADLs.  Baseline: 10/28/23:  Average pain scale range for LBP 0-5/10, neck pain scale 5-6/10 Goal status: MET  LONG TERM GOALS: Target date: 11/13/23  Pt will improve BLE MMT by at least 1/2 grade in order to demonstrate improved functional strength to return to desired activities.  Baseline: see objective. Goal status: IN PROGRESS  2.  Pt will improve 2 MWT by 115ft in order to demonstrate improved functional ambulatory capacity in community setting.  Baseline: see objective. ; 10/28/23: improved by 76ft pain free Goal status: IN PROGRESS  3.  Pt will improve Modified Oswestry score by 15% in order to demonstrate improved pain with functional goals and outcomes. Baseline: see objective. ;10/28/23:  Modified Oswestry Low Back Pain Disability Questionnaire: 6 / 50 = 12.0 % (was: 23 / 50 = 46.0 %  Goal status: MET  4.  Pt will report 2/10 pain with mobility in order to demonstrate reduced pain with ADLs lasting greater than 30  minutes.  Baseline: see objective.; Average pain scale range for LBP 0-5/10, neck pain scale 5-6/10 Goal status: MET  PLAN:  PT FREQUENCY: 2x/week  PT DURATION: 4 weeks  PLANNED INTERVENTIONS: 97164- PT Re-evaluation, 97110-Therapeutic exercises, 97530- Therapeutic activity, [id number]- Neuromuscular re-education, 97535- Self Care, 02859- Manual therapy, [id number]- Electrical stimulation (unattended), [id number]- Electrical stimulation (manual), 20560 (1-2 muscles), 20561 (3+ muscles)- Dry Needling, Balance training, Cryotherapy, and Moist heat.  PLAN FOR NEXT SESSION: Discharged   Lang Ada, PT, DPT [facility] Office: [phone] 3:58 PM, 11/12/23

## 2023-11-18 ENCOUNTER — Ambulatory Visit: Admitting: Orthopaedic Surgery

## 2023-11-18 ENCOUNTER — Encounter: Payer: Self-pay | Admitting: Orthopaedic Surgery

## 2023-11-18 VITALS — BP 115/74 | HR 79 | Ht 61.0 in | Wt 158.0 lb

## 2023-11-18 DIAGNOSIS — M25562 Pain in left knee: Secondary | ICD-10-CM | POA: Diagnosis not present

## 2023-11-18 DIAGNOSIS — G8929 Other chronic pain: Secondary | ICD-10-CM | POA: Diagnosis not present

## 2023-11-18 NOTE — Progress Notes (Signed)
 My back is better but my knee hurts more.  She is accompanied by an interpreter.  Her back pain is much less.  PT helped a lot.  She is moving better.  However, the left knee has more pain, instability and giving way, as well as swelling and popping.  She has no new trauma.  Left knee has effusion, crepitus, ROM 0 to 105, lateral 1+ laxity, positive medial McMurray, limp left, NV intact, no distal edema.  Encounter Diagnosis  Name Primary?   Chronic pain of left knee Yes   I will get MRI of the left knee.  Return in two weeks.  I have informed the patient I will be retiring from medical practice and from this office on December 31, 2023.  The patient has been offered continuing care with Dr. Margrette or Dr. Onesimo of this office.  The patient may choose another provider and the records will be forwarded after proper signature and notification.  Patient understands and agrees.  Call if any problem.  Precautions discussed.  Electronically Signed Lemond Stable, MD 8/20/20252:53 PM

## 2023-11-18 NOTE — Patient Instructions (Addendum)
 Follow up in two weks once you get the appt and had the exam for mri.   Call Zelda Salmon to schedule the appointment at [phone]

## 2023-11-20 ENCOUNTER — Encounter: Payer: Self-pay | Admitting: Radiology

## 2023-11-27 ENCOUNTER — Ambulatory Visit (HOSPITAL_COMMUNITY)
Admission: RE | Admit: 2023-11-27 | Discharge: 2023-11-27 | Disposition: A | Discharge status: Home / Self Care | Source: Ambulatory Visit | Attending: Orthopaedic Surgery | Admitting: Orthopaedic Surgery

## 2023-11-27 ENCOUNTER — Encounter (HOSPITAL_COMMUNITY): Payer: Self-pay

## 2023-11-27 DIAGNOSIS — M25562 Pain in left knee: Secondary | ICD-10-CM | POA: Insufficient documentation

## 2023-11-27 DIAGNOSIS — G8929 Other chronic pain: Secondary | ICD-10-CM | POA: Diagnosis not present

## 2023-11-27 DIAGNOSIS — M25462 Effusion, left knee: Secondary | ICD-10-CM | POA: Diagnosis not present

## 2023-11-27 DIAGNOSIS — M1712 Unilateral primary osteoarthritis, left knee: Secondary | ICD-10-CM | POA: Diagnosis not present

## 2023-11-27 DIAGNOSIS — Q686 Discoid meniscus: Secondary | ICD-10-CM | POA: Diagnosis not present

## 2023-11-27 DIAGNOSIS — M94262 Chondromalacia, left knee: Secondary | ICD-10-CM | POA: Diagnosis not present

## 2023-12-02 ENCOUNTER — Encounter: Payer: Self-pay | Admitting: Orthopaedic Surgery

## 2023-12-02 ENCOUNTER — Ambulatory Visit: Admitting: Orthopaedic Surgery

## 2023-12-02 DIAGNOSIS — M25561 Pain in right knee: Secondary | ICD-10-CM

## 2023-12-02 DIAGNOSIS — G8929 Other chronic pain: Secondary | ICD-10-CM

## 2023-12-02 NOTE — Progress Notes (Signed)
 I feel better.  She has little pain of the right knee now. She is walking well.  ROM is nearly full, has crepitus, very slight effusion, positive medial McMurray, no distal edema, NV intact.  MRI showed: IMPRESSION: Stable signal in the posterior horn and body of the medial meniscus suggesting a chronic subtle horizontal tear. No progression or significant meniscal tear is appreciated.   Stable tricompartmental osteoarthrosis most notably of the medial and patellofemoral compartment. Small reactive joint effusion.   New small cystic collection near the pes anserine insertion which could reflect a small pes anserine bursa.   I have explained findings to her.  No surgery if not having pain.  I have independently reviewed the MRI.    Encounter Diagnosis  Name Primary?   Chronic pain of right knee Yes   Return prn.  I have informed the patient I will be retiring from medical practice and from this office on December 31, 2023.  The patient has been offered continuing care with Dr. Margrette or Dr. Onesimo of this office.  The patient may choose another provider and the records will be forwarded after proper signature and notification.  Patient understands and agrees.  Call if any problem.  Precautions discussed.  Electronically Signed Lemond Stable, MD 9/3/202510:10 AM

## 2023-12-24 DIAGNOSIS — H5203 Hypermetropia, bilateral: Secondary | ICD-10-CM | POA: Diagnosis not present

## 2023-12-24 DIAGNOSIS — H43813 Vitreous degeneration, bilateral: Secondary | ICD-10-CM | POA: Diagnosis not present

## 2023-12-24 DIAGNOSIS — H26493 Other secondary cataract, bilateral: Secondary | ICD-10-CM | POA: Diagnosis not present

## 2023-12-24 DIAGNOSIS — H18593 Other hereditary corneal dystrophies, bilateral: Secondary | ICD-10-CM | POA: Diagnosis not present

## 2023-12-24 DIAGNOSIS — H04123 Dry eye syndrome of bilateral lacrimal glands: Secondary | ICD-10-CM | POA: Diagnosis not present

## 2023-12-24 DIAGNOSIS — H524 Presbyopia: Secondary | ICD-10-CM | POA: Diagnosis not present

## 2023-12-31 ENCOUNTER — Ambulatory Visit

## 2023-12-31 ENCOUNTER — Other Ambulatory Visit: Payer: Self-pay

## 2023-12-31 VITALS — Ht 61.0 in | Wt 160.0 lb

## 2023-12-31 DIAGNOSIS — Z780 Asymptomatic menopausal state: Secondary | ICD-10-CM | POA: Diagnosis not present

## 2023-12-31 DIAGNOSIS — Z0000 Encounter for general adult medical examination without abnormal findings: Secondary | ICD-10-CM

## 2023-12-31 NOTE — Progress Notes (Signed)
 Subjective:   Sheri Rice is a 68 y.o. who presents for a Medicare Wellness preventive visit.  As a reminder, Annual Wellness Visits don't include a physical exam, and some assessments may be limited, especially if this visit is performed virtually. We may recommend an in-person follow-up visit with your provider if needed.  Visit Complete: Virtual I connected with  Ragena Lav on 12/31/23 by a video and audio enabled telemedicine application and verified that I am speaking with the correct person using two identifiers.  Patient Location: Home  Provider Location: Home Office  I discussed the limitations of evaluation and management by telemedicine. The patient expressed understanding and agreed to proceed.  Vital Signs: Because this visit was a virtual/telehealth visit, some criteria may be missing or patient reported. Any vitals not documented were not able to be obtained and vitals that have been documented are patient reported.  Persons Participating in Visit: Patient.  AWV Questionnaire: No: Patient Medicare AWV questionnaire was not completed prior to this visit.  Cardiac Risk Factors include: advanced age (>27men, >71 women);dyslipidemia;hypertension;obesity (BMI >30kg/m2)     Objective:    Today's Vitals   12/31/23 1138 12/31/23 1139  Weight: 160 lb (72.6 kg)   Height: 5' 1 (1.549 m)   PainSc:  5    Body mass index is 30.23 kg/m.     12/31/2023   11:29 AM 09/29/2023    8:04 AM 07/18/2021    1:37 PM 12/18/2020   11:16 AM  Advanced Directives  Does Patient Have a Medical Advance Directive? No No No No  Would patient like information on creating a medical advance directive? No - Patient declined No - Patient declined No - Patient declined Yes (MAU/Ambulatory/Procedural Areas - Information given)    Current Medications (verified) Outpatient Encounter Medications as of 12/31/2023  Medication Sig   acetaminophen  (TYLENOL ) 500 MG tablet Take 2 tablets (1,000 mg  total) by mouth every 8 (eight) hours as needed.   aspirin  EC 81 MG tablet Take 1 tablet (81 mg total) by mouth daily. Swallow whole.   BOOSTRIX 5-2.5-18.5 LF-MCG/0.5 injection    Calcium  Carb-Cholecalciferol  (CALTRATE 600+D3) 600-20 MG-MCG TABS Take 600 mg by mouth 2 (two) times daily.   cholecalciferol  (VITAMIN D3) 25 MCG (1000 UNIT) tablet Take 1,000 Units by mouth daily.   cyclobenzaprine  (FLEXERIL ) 10 MG tablet Take 1 tablet (10 mg total) by mouth at bedtime. One tablet every night at bedtime as needed for spasm.   diltiazem  (CARDIZEM ) 30 MG tablet TAKE 1 TABLET AT BEDTIME   levothyroxine  (SYNTHROID ) 75 MCG tablet TAKE 1 TABLET EVERY DAY   MAGNESIUM GLUCONATE PO Take 400 mg by mouth daily.   omeprazole  (PRILOSEC) 40 MG capsule Take 1 capsule (40 mg total) by mouth daily.   rosuvastatin  (CRESTOR ) 10 MG tablet TAKE 1 TABLET EVERY DAY   TWINRIX 720-20 ELU-MCG/ML injection    Vitamin A 2400 MCG (8000 UT) CAPS Take 800 Units by mouth daily.   gabapentin  (NEURONTIN ) 100 MG capsule Take 1 capsule (100 mg total) by mouth at bedtime. (Patient not taking: Reported on 12/31/2023)   HYDROcodone -acetaminophen  (NORCO/VICODIN) 5-325 MG tablet One tablet every four hours for pain. (Patient not taking: Reported on 12/31/2023)   Facility-Administered Encounter Medications as of 12/31/2023  Medication   0.9 %  sodium chloride  infusion    Allergies (verified) Patient has no known allergies.   History: Past Medical History:  Diagnosis Date   GERD (gastroesophageal reflux disease)    Hyperlipidemia  Hypothyroidism    Past Surgical History:  Procedure Laterality Date   BALLOON DILATION N/A 11/26/2021   Procedure: BALLOON DILATION;  Surgeon: Cindie Carlin POUR, DO;  Location: [facility];  Service: Endoscopy;  Laterality: N/A;   BIOPSY  11/26/2021   Procedure: BIOPSY;  Surgeon: Cindie Carlin POUR, DO;  Location: [facility];  Service: Endoscopy;;   CHONDROPLASTY Left 07/23/2021   Procedure:  CHONDROPLASTY;  Surgeon: Margrette Taft BRAVO, MD;  Location: [facility];  Service: Orthopedics;  Laterality: Left;   COLONOSCOPY  06/2017   Dr. Norleen Kiang;  Small internal hemorrhoids, otherwise normal exam. Repeat in 10 years.   DILATION AND CURETTAGE OF UTERUS     ESOPHAGOGASTRODUODENOSCOPY  06/2017   Dr. Norleen Kiang; Normal exam   ESOPHAGOGASTRODUODENOSCOPY (EGD) WITH PROPOFOL  N/A 11/26/2021   Procedure: ESOPHAGOGASTRODUODENOSCOPY (EGD) WITH PROPOFOL ;  Surgeon: Cindie Carlin POUR, DO;  Location: [facility];  Service: Endoscopy;  Laterality: N/A;  10:30 am   KNEE ARTHROSCOPY WITH MEDIAL MENISECTOMY Left 07/23/2021   Procedure: KNEE ARTHROSCOPY WITH MEDIAL MENISECTOMY;  Surgeon: Margrette Taft BRAVO, MD;  Location: [facility];  Service: Orthopedics;  Laterality: Left;   Family History  Problem Relation Age of Onset   Diabetes Mother    Depression Mother    Diabetes type II Sister    Hypertension Sister    Stroke Maternal Grandmother    Colon cancer Neg Hx    Liver cancer Neg Hx    Breast cancer Neg Hx    Cervical cancer Neg Hx    Stomach cancer Neg Hx    Gastric cancer Neg Hx    Social History   Socioeconomic History   Marital status: Married    Spouse name: Not on file   Number of children: 3   Years of education: 11   Highest education level: Not on file  Occupational History   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   Tobacco Use   Smoking status: Never   Smokeless tobacco: Never  Vaping Use   Vaping status: Never Used  Substance and Sexual Activity   Alcohol use: No    Alcohol/week: 0.0 standard drinks of alcohol   Drug use: Never   Sexual activity: Yes    Birth control/protection: Post-menopausal  Other Topics Concern   Not on file  Social History Narrative   Pt is retired, Married , lives with her husband    Social Drivers of Corporate investment banker Strain: Low Risk  (12/31/2023)   Overall Financial Resource Strain (CARDIA)    Difficulty of Paying Living Expenses: Not hard at  all  Food Insecurity: No Food Insecurity (12/31/2023)   Hunger Vital Sign    Worried About Running Out of Food in the Last Year: Never true    Ran Out of Food in the Last Year: Never true  Transportation Needs: No Transportation Needs (12/31/2023)   PRAPARE - Administrator, Civil Service (Medical): No    Lack of Transportation (Non-Medical): No  Physical Activity: Sufficiently Active (12/31/2023)   Exercise Vital Sign    Days of Exercise per Week: 7 days    Minutes of Exercise per Session: 30 min  Stress: No Stress Concern Present (12/31/2023)   [employer] of Occupational Health - Occupational Stress Questionnaire    Feeling of Stress: Not at all  Social Connections: Socially Integrated (12/31/2023)   Social Connection and Isolation Panel    Frequency of Communication with Friends and Family: More than three times a week  Frequency of Social Gatherings with Friends and Family: More than three times a week    Attends Religious Services: More than 4 times per year    Active Member of Clubs or Organizations: Yes    Attends Engineer, structural: More than 4 times per year    Marital Status: Married    Tobacco Counseling Counseling given: Yes    Clinical Intake:  Pre-visit preparation completed: Yes  Pain : 0-10 Pain Score: 5  Pain Type: Acute pain Pain Location: Head Pain Orientation: Left, Right Pain Descriptors / Indicators: Aching, Radiating Pain Onset: Today Pain Frequency: Constant     BMI - recorded: 30.23 Nutritional Status: BMI > 30  Obese Nutritional Risks: None Diabetes: No  Lab Results  Component Value Date   HGBA1C 5.8 (H) 12/09/2022   HGBA1C 5.2 07/23/2022   HGBA1C 5.5 04/22/2022     How often do you need to have someone help you when you read instructions, pamphlets, or other written materials from your doctor or pharmacy?: 1 - Never  Interpreter Needed?: No  Information entered by :: Kristien Salatino W CMA (AAMA)   Activities of  Daily Living     12/31/2023   11:45 AM  In your present state of health, do you have any difficulty performing the following activities:  Hearing? 0  Vision? 0  Difficulty concentrating or making decisions? 0  Walking or climbing stairs? 0  Dressing or bathing? 0  Doing errands, shopping? 0  Preparing Food and eating ? N  Using the Toilet? N  In the past six months, have you accidently leaked urine? N  Do you have problems with loss of bowel control? N  Managing your Medications? N  Managing your Finances? N  Housekeeping or managing your Housekeeping? N    Patient Care Team: Bacchus, Meade PEDLAR, FNP as PCP - General (Family Medicine) Cindie Carlin POUR, DO as Consulting Physician (Gastroenterology) Patrcia Sharper, MD as Consulting Physician (Ophthalmology) Gerome Maurilio HERO, PA-C as Physician Assistant (Vascular Surgery) Margrette Taft BRAVO, MD as Consulting Physician (Orthopedic Surgery)   I have updated your Care Teams any recent Medical Services you may have received from other providers in the past year.     Assessment:   This is a routine wellness examination for [facility].  Hearing/Vision screen Hearing Screening - Comments:: Patient denies any hearing difficulties.   Vision Screening - Comments:: Patient had cataracts removed last year. Patient having difficulty seeing close up. Has an appt with Dr. Patrcia next Tuesday   Goals Addressed               This Visit's Progress     I want to feel better (pt-stated)        Right now I'm having issues with my eyes. I had laser surgery on my left eye last week.         Depression Screen     12/31/2023    2:51 PM 08/13/2023    9:56 AM 01/05/2023    9:18 AM 12/09/2022    8:10 AM 12/09/2022    8:02 AM 09/09/2022    8:19 AM 08/26/2022    8:10 AM  PHQ 2/9 Scores  PHQ - 2 Score 0 0 0 0 0 0 0  PHQ- 9 Score 0 1 1 0 0 0 0     Fall Risk     12/31/2023   11:41 AM 08/13/2023    9:56 AM 01/05/2023    9:18 AM 12/09/2022  8:10 AM 12/09/2022    8:02 AM  Fall Risk   Falls in the past year? 0 0 0 0 0  Number falls in past yr: 0 0 0 0 0  Injury with Fall? 0 0 0 0 0  Risk for fall due to : No Fall Risks No Fall Risks No Fall Risks No Fall Risks No Fall Risks  Follow up Falls evaluation completed;Education provided;Falls prevention discussed Falls evaluation completed Falls evaluation completed Falls evaluation completed Falls evaluation completed    MEDICARE RISK AT HOME:  Medicare Risk at Home Any stairs in or around the home?: No If so, are there any without handrails?: No Home free of loose throw rugs in walkways, pet beds, electrical cords, etc?: Yes Adequate lighting in your home to reduce risk of falls?: Yes Life alert?: No Use of a cane, walker or w/c?: No Grab bars in the bathroom?: No Shower chair or bench in shower?: No Elevated toilet seat or a handicapped toilet?: No  TIMED UP AND GO:  Was the test performed?  No  Cognitive Function: 6CIT completed    12/18/2020   11:01 AM  MMSE - Mini Mental State Exam  Orientation to time 5  Orientation to Place 5  Registration 3  Attention/ Calculation 3  Recall 2  Language- name 2 objects 2  Language- repeat 1  Language- follow 3 step command 3  Language- read & follow direction 1  Write a sentence 1  Copy design 1  Total score 27        12/31/2023   11:45 AM 09/09/2022    8:19 AM  6CIT Screen  What Year? 0 points 0 points  What month? 0 points 0 points  What time? 0 points 0 points  Count back from 20 0 points 0 points  Months in reverse 0 points 0 points  Repeat phrase 0 points 0 points  Total Score 0 points 0 points    Immunizations Immunization History  Administered Date(s) Administered   Fluad Quad(high Dose 65+) 12/18/2020   Fluad Trivalent(High Dose 65+) 12/09/2022   Influenza, Quadrivalent, Recombinant, Inj, Pf 01/03/2022   Influenza,inj,Quad PF,6+ Mos 03/03/2016, 03/16/2019   PFIZER Comirnaty(Gray Top)Covid-19  Tri-Sucrose Vaccine 02/16/2020, 10/31/2020, 05/17/2021   PFIZER(Purple Top)SARS-COV-2 Vaccination 06/03/2019, 07/01/2019   PNEUMOCOCCAL CONJUGATE-20 12/18/2020   Pfizer Covid-19 Vaccine Bivalent Booster 36yrs & up 02/10/2022   Td 06/30/2006   Tdap 04/01/2017   Zoster Recombinant(Shingrix) 05/27/2021, 09/03/2021    Screening Tests Health Maintenance  Topic Date Due   DEXA SCAN  05/14/2023   Influenza Vaccine  10/30/2023   COVID-19 Vaccine (7 - 2025-26 season) 11/30/2023   Mammogram  09/06/2024   Medicare Annual Wellness (AWV)  12/30/2024   DTaP/Tdap/Td (3 - Td or Tdap) 04/02/2027   Colonoscopy  06/30/2027   Pneumococcal Vaccine: 50+ Years  Completed   Hepatitis C Screening  Completed   Zoster Vaccines- Shingrix  Completed   HPV VACCINES  Aged Out   Meningococcal B Vaccine  Aged Out    Health Maintenance Health Maintenance Due  Topic Date Due   DEXA SCAN  05/14/2023   Influenza Vaccine  10/30/2023   COVID-19 Vaccine (7 - 2025-26 season) 11/30/2023   Health Maintenance Items Addressed: DEXA scheduled  Additional Screening:  Vision Screening: Recommended annual ophthalmology exams for early detection of glaucoma and other disorders of the eye. Would you like a referral to an eye doctor? No    Dental Screening: Recommended annual dental exams for proper oral  hygiene  Community Resource Referral / Chronic Care Management: CRR required this visit?  No   CCM required this visit?  No   Plan:    I have personally reviewed and noted the following in the patient's chart:   Medical and social history Use of alcohol, tobacco or illicit drugs  Current medications and supplements including opioid prescriptions. Patient is currently taking opioid prescriptions. Information provided to patient regarding non-opioid alternatives. Patient advised to discuss non-opioid treatment plan with their provider. Functional ability and status Nutritional status Physical activity Advanced  directives List of other physicians Hospitalizations, surgeries, and ER visits in previous 12 months Vitals Screenings to include cognitive, depression, and falls Referrals and appointments  In addition, I have reviewed and discussed with patient certain preventive protocols, quality metrics, and best practice recommendations. A written personalized care plan for preventive services as well as general preventive health recommendations were provided to patient.   Tamiah Dysart, CMA   12/31/2023   After Visit Summary: (MyChart) Due to this being a telephonic visit, the after visit summary with patients personalized plan was offered to patient via MyChart   Notes: Nothing significant to report at this time.

## 2023-12-31 NOTE — Patient Instructions (Signed)
 Sheri Rice,  Thank you for taking the time for your Medicare Wellness Visit. I appreciate your continued commitment to your health goals. Please review the care plan we discussed, and feel free to reach out if I can assist you further.  Medicare recommends these wellness visits once per year to help you and your care team stay ahead of potential health issues. These visits are designed to focus on prevention, allowing your provider to concentrate on managing your acute and chronic conditions during your regular appointments.  Please note that Annual Wellness Visits do not include a physical exam. Some assessments may be limited, especially if the visit was conducted virtually. If needed, we may recommend a separate in-person follow-up with your provider.  Ongoing Care  Seeing your primary care provider every 3 to 6 months helps us  monitor your health and provide consistent, personalized care.   Referrals  Bone Density Screening: Call [facility] @ Phone: [phone]   Recommended Screenings:  Health Maintenance  Topic Date Due   DEXA scan (bone density measurement)  05/14/2023   Flu Shot  10/30/2023   COVID-19 Vaccine (7 - 2025-26 season) 11/30/2023   Breast Cancer Screening  09/06/2024   Medicare Annual Wellness Visit  12/30/2024   DTaP/Tdap/Td vaccine (3 - Td or Tdap) 04/02/2027   Colon Cancer Screening  06/30/2027   Pneumococcal Vaccine for age over 74  Completed   Hepatitis C Screening  Completed   Zoster (Shingles) Vaccine  Completed   HPV Vaccine  Aged Out   Meningitis B Vaccine  Aged Out       12/31/2023   11:29 AM  Advanced Directives  Does Patient Have a Medical Advance Directive? No  Would patient like information on creating a medical advance directive? No - Patient declined    Advance Care Planning is important because it: Ensures you receive medical care that aligns with your values, goals, and preferences. Provides guidance to your family and loved  ones, reducing the emotional burden of decision-making during critical moments.  Vision: Annual vision screenings are recommended for early detection of glaucoma, cataracts, and diabetic retinopathy. These exams can also reveal signs of chronic conditions such as diabetes and high blood pressure.  Dental: Annual dental screenings help detect early signs of oral cancer, gum disease, and other conditions linked to overall health, including heart disease and diabetes.  Please see the attached documents for additional preventive care recommendations.

## 2024-01-05 ENCOUNTER — Other Ambulatory Visit (HOSPITAL_COMMUNITY)

## 2024-01-05 DIAGNOSIS — H26492 Other secondary cataract, left eye: Secondary | ICD-10-CM | POA: Diagnosis not present

## 2024-01-09 ENCOUNTER — Other Ambulatory Visit: Payer: Self-pay | Admitting: Family Medicine

## 2024-01-09 DIAGNOSIS — E039 Hypothyroidism, unspecified: Secondary | ICD-10-CM

## 2024-01-09 DIAGNOSIS — G4762 Sleep related leg cramps: Secondary | ICD-10-CM

## 2024-01-12 ENCOUNTER — Ambulatory Visit (HOSPITAL_COMMUNITY)
Admission: RE | Admit: 2024-01-12 | Discharge: 2024-01-12 | Disposition: A | Discharge status: Home / Self Care | Source: Ambulatory Visit | Attending: Family Medicine | Admitting: Family Medicine

## 2024-01-12 DIAGNOSIS — Z780 Asymptomatic menopausal state: Secondary | ICD-10-CM | POA: Insufficient documentation

## 2024-01-12 DIAGNOSIS — M8589 Other specified disorders of bone density and structure, multiple sites: Secondary | ICD-10-CM | POA: Diagnosis not present

## 2024-01-14 ENCOUNTER — Ambulatory Visit: Payer: Self-pay | Admitting: Family Medicine

## 2024-01-14 NOTE — Progress Notes (Signed)
Please inform the patient that he dexa scan results shows osteopenia. This is another word for low bone mineral densit or low bone mass. People with low bone mass generally have a lower risk of breaking a bone than people with osteoporosis. But their bone mineral density is below normal.i recommend a dietary intake of approximately 1200 mg daily calcium and ingest a total of 800 international units of vitamin D daily. If there is inadequate dietary intake of calcium, i recommend taking supplemental elemental calcium (generally 500 to 1000 mg/day.

## 2024-01-19 DIAGNOSIS — H26491 Other secondary cataract, right eye: Secondary | ICD-10-CM | POA: Diagnosis not present

## 2024-02-01 ENCOUNTER — Encounter: Payer: Self-pay | Admitting: Radiology

## 2024-04-27 ENCOUNTER — Ambulatory Visit: Admitting: Orthopedic Surgery

## 2024-04-27 ENCOUNTER — Encounter: Payer: Self-pay | Admitting: Orthopedic Surgery

## 2024-04-27 DIAGNOSIS — G8929 Other chronic pain: Secondary | ICD-10-CM | POA: Diagnosis not present

## 2024-04-27 DIAGNOSIS — M25562 Pain in left knee: Secondary | ICD-10-CM | POA: Diagnosis not present

## 2024-04-27 NOTE — Patient Instructions (Signed)

## 2024-04-27 NOTE — Progress Notes (Signed)
 New Patient Visit  Summary: Sheri Rice is a 69 y.o. female with the following: Left knee pain; areas of chondromalacia confirmed on MRI, as well as knee arthroscopy in 2023  Assessment and Plan Assessment & Plan Left knee osteoarthritis with associated left thigh muscle atrophy Moderate osteoarthritis confirmed by MRI. Symptoms include bone pain, muscle discomfort, and functional limitations. No surgical indication. Prior corticosteroid injection minimally effective. Multimodal nonoperative management appropriate. - Discussed viscosupplementation injections for symptomatic relief, including insurance authorization and typical series schedule. Explained differences between steroid and gel injections. - Recommended trial of acetaminophen  and/or ibuprofen  for analgesia, noting ibuprofen 's superior anti-inflammatory effect. - Suggested knee brace or compression sleeve for support. - Encouraged quadriceps strengthening exercises to address muscle atrophy and improve knee pain. - Offered referral to physical therapy for muscle strengthening and rehabilitation. - Provided anticipatory guidance on incremental pain reduction with strengthening and multimodal management; advised persistence with exercises and regular activity. - Arranged for staff to discuss gel injection logistics and insurance authorization.     Follow-up: Return for After Insurance Authorization for Injection.  Subjective:  Chief Complaint  Patient presents with   Knee Pain    Bilat L > R with swelling, pt states she has had a meniscal repair in L by Dr. Margrette in '23. ALso c/o leg cramps      Discussed the use of AI scribe software for clinical note transcription with the patient, who gave verbal consent to proceed.  History of Present Illness Sheri Rice is a 68 year old female with left knee osteoarthritis who presents for evaluation of worsening left knee pain.  She previous had a left knee arthroscopy,  with partial medial meniscectomy, which noted some areas of chondromalacia.  This was in April 2023 by Dr. Margrette.  She has progressive left knee pain now radiating through much of the left leg. The pain is deep and bone-like with surrounding muscle discomfort and involves the entire knee. It is worst at night and disrupts sleep. Daytime pain is milder but increases with sit-to-stand transitions and stair climbing, and is associated with stiffness. She avoids stairs and notes left leg cramps she attributes to weakness and fatigue.  She had a left knee injection in summer 2025 with only 1-2 days of mild relief. MRI at that time reportedly showed advanced osteoarthritis without meniscal tear or indication for surgery. She previously completed back-focused physical therapy and intermittently continues those exercises. She has not tried a knee brace. Compression socks provide some relief.  She uses acetaminophen  for pain, does not use ibuprofen . She has tried topical agents. She is concerned about poor response to prior injection and asks about alternative injection options.  She has difficulty sleeping because of pain when lying on her side or back. She wonders if the pain may come from her hip or back. A vascular specialist did not find a vascular cause for her symptoms.    Review of Systems: No fevers or chills No numbness or tingling No chest pain No shortness of breath No bowel or bladder dysfunction No GI distress No headaches   Medical History:  Past Medical History:  Diagnosis Date   GERD (gastroesophageal reflux disease)    Hyperlipidemia    Hypothyroidism     Past Surgical History:  Procedure Laterality Date   BALLOON DILATION N/A 11/26/2021   Procedure: BALLOON DILATION;  Surgeon: Cindie Carlin POUR, DO;  Location: [facility];  Service: Endoscopy;  Laterality: N/A;   BIOPSY  11/26/2021  Procedure: BIOPSY;  Surgeon: Cindie Carlin POUR, DO;  Location: [facility];   Service: Endoscopy;;   CHONDROPLASTY Left 07/23/2021   Procedure: CHONDROPLASTY;  Surgeon: Margrette Taft BRAVO, MD;  Location: [facility];  Service: Orthopedics;  Laterality: Left;   COLONOSCOPY  06/2017   Dr. Norleen Kiang;  Small internal hemorrhoids, otherwise normal exam. Repeat in 10 years.   DILATION AND CURETTAGE OF UTERUS     ESOPHAGOGASTRODUODENOSCOPY  06/2017   Dr. Norleen Kiang; Normal exam   ESOPHAGOGASTRODUODENOSCOPY (EGD) WITH PROPOFOL  N/A 11/26/2021   Procedure: ESOPHAGOGASTRODUODENOSCOPY (EGD) WITH PROPOFOL ;  Surgeon: Cindie Carlin POUR, DO;  Location: [facility];  Service: Endoscopy;  Laterality: N/A;  10:30 am   KNEE ARTHROSCOPY WITH MEDIAL MENISECTOMY Left 07/23/2021   Procedure: KNEE ARTHROSCOPY WITH MEDIAL MENISECTOMY;  Surgeon: Margrette Taft BRAVO, MD;  Location: [facility];  Service: Orthopedics;  Laterality: Left;    Family History  Problem Relation Age of Onset   Diabetes Mother    Depression Mother    Diabetes type II Sister    Hypertension Sister    Stroke Maternal Grandmother    Colon cancer Neg Hx    Liver cancer Neg Hx    Breast cancer Neg Hx    Cervical cancer Neg Hx    Stomach cancer Neg Hx    Gastric cancer Neg Hx    Social History[1]  Allergies[2]  Active Medications[3]  Objective: There were no vitals taken for this visit.  Physical Exam:    General: Alert and oriented. and No acute distress. Gait: Normal gait.  Physical Exam MUSCULOSKELETAL: Left knee without obvious deformity.  She has some superficial veins which are prominent.  Good range of motion.  Crepitus with range of motion.  Atrophy of the left quadriceps.  No increased laxity varus or valgus stress.  Negative Lachman.  No swelling in the left knee.   IMAGING: I personally reviewed images previously obtained in clinic   Recent left knee MRI was available in clinic today.  Left knee MRI  IMPRESSION: Stable signal in the posterior horn and body of the medial meniscus  suggesting a chronic subtle horizontal tear. No progression or significant meniscal tear is appreciated.   Stable tricompartmental osteoarthrosis most notably of the medial and patellofemoral compartment. Small reactive joint effusion.   New small cystic collection near the pes anserine insertion which could reflect a small pes anserine bursa.     New Medications:  No orders of the defined types were placed in this encounter.     Portions of this note were completed via Scientist, clinical (histocompatibility and immunogenetics).  Oneil DELENA Horde, MD  04/27/2024 9:08 AM      [1]  Social History Tobacco Use   Smoking status: Never   Smokeless tobacco: Never  Vaping Use   Vaping status: Never Used  Substance Use Topics   Alcohol use: No    Alcohol/week: 0.0 standard drinks of alcohol   Drug use: Never  [2] No Known Allergies [3]  Current Meds  Medication Sig   acetaminophen  (TYLENOL ) 500 MG tablet Take 2 tablets (1,000 mg total) by mouth every 8 (eight) hours as needed.   aspirin  EC 81 MG tablet Take 1 tablet (81 mg total) by mouth daily. Swallow whole.   BOOSTRIX 5-2.5-18.5 LF-MCG/0.5 injection    Calcium  Carb-Cholecalciferol  (CALTRATE 600+D3) 600-20 MG-MCG TABS Take 600 mg by mouth 2 (two) times daily.   cholecalciferol  (VITAMIN D3) 25 MCG (1000 UNIT) tablet Take 1,000 Units by mouth daily.  cyclobenzaprine  (FLEXERIL ) 10 MG tablet Take 1 tablet (10 mg total) by mouth at bedtime. One tablet every night at bedtime as needed for spasm.   diltiazem  (CARDIZEM ) 30 MG tablet TAKE 1 TABLET AT BEDTIME   levothyroxine  (SYNTHROID ) 75 MCG tablet TAKE 1 TABLET EVERY DAY   MAGNESIUM GLUCONATE PO Take 400 mg by mouth daily.   omeprazole  (PRILOSEC) 40 MG capsule Take 1 capsule (40 mg total) by mouth daily.   rosuvastatin  (CRESTOR ) 10 MG tablet TAKE 1 TABLET EVERY DAY   TWINRIX 720-20 ELU-MCG/ML injection    Vitamin A 2400 MCG (8000 UT) CAPS Take 800 Units by mouth daily.   Current Facility-Administered  Medications for the 04/27/24 encounter (Office Visit) with Onesimo Oneil LABOR, MD  Medication   0.9 %  sodium chloride  infusion

## 2024-05-03 ENCOUNTER — Ambulatory Visit: Admitting: Family Medicine

## 2024-05-25 ENCOUNTER — Ambulatory Visit: Payer: Self-pay | Admitting: Nurse Practitioner

## 2025-01-03 ENCOUNTER — Ambulatory Visit
